# Patient Record
Sex: Male | Born: 1955 | Race: Black or African American | Hispanic: No | Marital: Married | Smoking: Current every day smoker
Health system: Southern US, Community
[De-identification: ages and names within clinical notes are randomized; demographics above are authoritative.]

## PROBLEM LIST (undated history)

## (undated) DIAGNOSIS — J449 Chronic obstructive pulmonary disease, unspecified: Secondary | ICD-10-CM

## (undated) DIAGNOSIS — M1990 Unspecified osteoarthritis, unspecified site: Secondary | ICD-10-CM

## (undated) DIAGNOSIS — J61 Pneumoconiosis due to asbestos and other mineral fibers: Secondary | ICD-10-CM

## (undated) DIAGNOSIS — J439 Emphysema, unspecified: Secondary | ICD-10-CM

## (undated) DIAGNOSIS — I10 Essential (primary) hypertension: Secondary | ICD-10-CM

## (undated) DIAGNOSIS — Z86010 Personal history of colonic polyps: Secondary | ICD-10-CM

## (undated) DIAGNOSIS — Z8619 Personal history of other infectious and parasitic diseases: Secondary | ICD-10-CM

## (undated) HISTORY — DX: Unspecified osteoarthritis, unspecified site: M19.90

## (undated) HISTORY — DX: Essential (primary) hypertension: I10

## (undated) HISTORY — PX: COLONOSCOPY: SHX174

## (undated) HISTORY — DX: Chronic obstructive pulmonary disease, unspecified: J44.9

## (undated) HISTORY — DX: Personal history of colonic polyps: Z86.010

## (undated) HISTORY — DX: Emphysema, unspecified: J43.9

## (undated) HISTORY — DX: Personal history of other infectious and parasitic diseases: Z86.19

---

## 1998-07-28 ENCOUNTER — Encounter: Payer: Self-pay | Admitting: Family Medicine

## 1998-07-28 ENCOUNTER — Ambulatory Visit (HOSPITAL_COMMUNITY): Admission: RE | Admit: 1998-07-28 | Discharge: 1998-07-28 | Payer: Self-pay | Admitting: *Deleted

## 2000-05-07 ENCOUNTER — Encounter: Payer: Self-pay | Admitting: Family Medicine

## 2000-05-07 ENCOUNTER — Encounter: Admission: RE | Admit: 2000-05-07 | Discharge: 2000-05-07 | Payer: Self-pay | Admitting: Family Medicine

## 2005-04-19 ENCOUNTER — Ambulatory Visit: Payer: Self-pay | Admitting: Internal Medicine

## 2008-08-05 ENCOUNTER — Ambulatory Visit: Payer: Self-pay | Admitting: Family Medicine

## 2008-08-05 DIAGNOSIS — I10 Essential (primary) hypertension: Secondary | ICD-10-CM

## 2008-08-06 ENCOUNTER — Telehealth: Payer: Self-pay | Admitting: Family Medicine

## 2008-08-06 ENCOUNTER — Encounter: Payer: Self-pay | Admitting: Family Medicine

## 2008-08-06 LAB — CONVERTED CEMR LAB
ALT: 25 units/L (ref 0–53)
AST: 21 units/L (ref 0–37)
Albumin: 4.6 g/dL (ref 3.5–5.2)
Alkaline Phosphatase: 78 units/L (ref 39–117)
BUN: 25 mg/dL — ABNORMAL HIGH (ref 6–23)
CO2: 21 meq/L (ref 19–32)
Calcium: 9.8 mg/dL (ref 8.4–10.5)
Chloride: 102 meq/L (ref 96–112)
Creatinine, Ser: 1.38 mg/dL (ref 0.40–1.50)
Glucose, Bld: 95 mg/dL (ref 70–99)
Potassium: 4.2 meq/L (ref 3.5–5.3)
Sodium: 136 meq/L (ref 135–145)
TSH: 2.325 microintl units/mL (ref 0.350–4.500)
Total Bilirubin: 0.5 mg/dL (ref 0.3–1.2)
Total Protein: 7.6 g/dL (ref 6.0–8.3)

## 2008-09-09 ENCOUNTER — Ambulatory Visit: Payer: Self-pay | Admitting: Family Medicine

## 2008-09-09 DIAGNOSIS — K219 Gastro-esophageal reflux disease without esophagitis: Secondary | ICD-10-CM

## 2008-12-29 ENCOUNTER — Encounter (INDEPENDENT_AMBULATORY_CARE_PROVIDER_SITE_OTHER): Payer: Self-pay | Admitting: *Deleted

## 2009-02-08 ENCOUNTER — Encounter: Admission: RE | Admit: 2009-02-08 | Discharge: 2009-02-08 | Payer: Self-pay | Admitting: Family Medicine

## 2009-02-08 ENCOUNTER — Ambulatory Visit: Payer: Self-pay | Admitting: Family Medicine

## 2009-02-08 DIAGNOSIS — F1010 Alcohol abuse, uncomplicated: Secondary | ICD-10-CM | POA: Insufficient documentation

## 2009-02-08 DIAGNOSIS — Z77090 Contact with and (suspected) exposure to asbestos: Secondary | ICD-10-CM

## 2009-02-08 DIAGNOSIS — F1020 Alcohol dependence, uncomplicated: Secondary | ICD-10-CM | POA: Insufficient documentation

## 2009-02-09 ENCOUNTER — Telehealth: Payer: Self-pay | Admitting: Family Medicine

## 2009-02-10 ENCOUNTER — Encounter: Payer: Self-pay | Admitting: Family Medicine

## 2009-02-10 LAB — CONVERTED CEMR LAB
Folate: 9.3 ng/mL
Vitamin B-12: 375 pg/mL (ref 211–911)

## 2009-06-03 ENCOUNTER — Encounter (INDEPENDENT_AMBULATORY_CARE_PROVIDER_SITE_OTHER): Payer: Self-pay | Admitting: *Deleted

## 2009-06-04 ENCOUNTER — Ambulatory Visit: Payer: Self-pay | Admitting: Internal Medicine

## 2009-06-18 ENCOUNTER — Ambulatory Visit: Payer: Self-pay | Admitting: Internal Medicine

## 2009-06-22 ENCOUNTER — Encounter: Payer: Self-pay | Admitting: Internal Medicine

## 2009-07-03 ENCOUNTER — Ambulatory Visit: Payer: Self-pay | Admitting: Family Medicine

## 2009-07-03 DIAGNOSIS — K1120 Sialoadenitis, unspecified: Secondary | ICD-10-CM

## 2009-07-04 ENCOUNTER — Encounter: Payer: Self-pay | Admitting: Family Medicine

## 2009-07-04 LAB — CONVERTED CEMR LAB
ALT: 26 units/L (ref 0–53)
AST: 22 units/L (ref 0–37)
Albumin: 4.3 g/dL (ref 3.5–5.2)
Alkaline Phosphatase: 72 units/L (ref 39–117)
Amylase: 53 units/L (ref 0–105)
BUN: 22 mg/dL (ref 6–23)
Basophils Absolute: 0 10*3/uL (ref 0.0–0.1)
Basophils Relative: 1 % (ref 0–1)
CO2: 21 meq/L (ref 19–32)
Calcium: 9.5 mg/dL (ref 8.4–10.5)
Chloride: 107 meq/L (ref 96–112)
Creatinine, Ser: 1.18 mg/dL (ref 0.40–1.50)
Eosinophils Absolute: 0.1 10*3/uL (ref 0.0–0.7)
Eosinophils Relative: 2 % (ref 0–5)
Glucose, Bld: 90 mg/dL (ref 70–99)
HCT: 44 % (ref 39.0–52.0)
Hemoglobin: 14.9 g/dL (ref 13.0–17.0)
Lymphocytes Relative: 33 % (ref 12–46)
Lymphs Abs: 2.1 10*3/uL (ref 0.7–4.0)
MCHC: 33.9 g/dL (ref 30.0–36.0)
MCV: 101.6 fL — ABNORMAL HIGH (ref 78.0–100.0)
Monocytes Absolute: 0.6 10*3/uL (ref 0.1–1.0)
Monocytes Relative: 9 % (ref 3–12)
Mumps IgG: 3.6 — ABNORMAL HIGH
Neutro Abs: 3.5 10*3/uL (ref 1.7–7.7)
Neutrophils Relative %: 55 % (ref 43–77)
Platelets: 192 10*3/uL (ref 150–400)
Potassium: 4.4 meq/L (ref 3.5–5.3)
RBC: 4.33 M/uL (ref 4.22–5.81)
RDW: 13 % (ref 11.5–15.5)
Sodium: 139 meq/L (ref 135–145)
Total Bilirubin: 0.5 mg/dL (ref 0.3–1.2)
Total Protein: 7.2 g/dL (ref 6.0–8.3)
WBC: 6.4 10*3/uL (ref 4.0–10.5)

## 2009-07-09 ENCOUNTER — Ambulatory Visit: Payer: Self-pay | Admitting: Family Medicine

## 2009-08-13 ENCOUNTER — Ambulatory Visit: Payer: Self-pay | Admitting: Family Medicine

## 2009-09-16 ENCOUNTER — Ambulatory Visit: Payer: Self-pay | Admitting: Family Medicine

## 2009-09-16 ENCOUNTER — Encounter: Admission: RE | Admit: 2009-09-16 | Discharge: 2009-09-16 | Payer: Self-pay | Admitting: Family Medicine

## 2009-09-16 DIAGNOSIS — M79609 Pain in unspecified limb: Secondary | ICD-10-CM

## 2009-10-26 ENCOUNTER — Ambulatory Visit: Payer: Self-pay | Admitting: Family

## 2009-10-26 DIAGNOSIS — F17200 Nicotine dependence, unspecified, uncomplicated: Secondary | ICD-10-CM

## 2009-11-29 ENCOUNTER — Encounter: Payer: Self-pay | Admitting: Family Medicine

## 2009-12-08 ENCOUNTER — Ambulatory Visit: Payer: Self-pay | Admitting: Family Medicine

## 2009-12-08 DIAGNOSIS — H0019 Chalazion unspecified eye, unspecified eyelid: Secondary | ICD-10-CM | POA: Insufficient documentation

## 2009-12-23 ENCOUNTER — Ambulatory Visit: Payer: Self-pay | Admitting: Family Medicine

## 2009-12-23 DIAGNOSIS — J449 Chronic obstructive pulmonary disease, unspecified: Secondary | ICD-10-CM

## 2009-12-23 DIAGNOSIS — J4489 Other specified chronic obstructive pulmonary disease: Secondary | ICD-10-CM | POA: Insufficient documentation

## 2009-12-27 ENCOUNTER — Encounter: Payer: Self-pay | Admitting: Family Medicine

## 2010-02-11 ENCOUNTER — Encounter: Payer: Self-pay | Admitting: Family Medicine

## 2010-03-01 ENCOUNTER — Ambulatory Visit: Payer: Self-pay | Admitting: Family Medicine

## 2010-06-19 LAB — CONVERTED CEMR LAB
ALT: 28 units/L (ref 0–53)
AST: 29 units/L (ref 0–37)
Albumin: 4.4 g/dL (ref 3.5–5.2)
Alkaline Phosphatase: 73 units/L (ref 39–117)
BUN: 20 mg/dL (ref 6–23)
CO2: 18 meq/L — ABNORMAL LOW (ref 19–32)
Calcium: 9.4 mg/dL (ref 8.4–10.5)
Chloride: 107 meq/L (ref 96–112)
Cholesterol: 167 mg/dL (ref 0–200)
Creatinine, Ser: 1.19 mg/dL (ref 0.40–1.50)
Glucose, Bld: 107 mg/dL — ABNORMAL HIGH (ref 70–99)
HCT: 43.9 % (ref 39.0–52.0)
HDL: 81 mg/dL (ref >39)
Hemoglobin: 15.1 g/dL (ref 13.0–17.0)
LDL Cholesterol: 59 mg/dL (ref 0–99)
MCHC: 34.4 g/dL (ref 30.0–36.0)
MCV: 102.1 fL — ABNORMAL HIGH (ref 78.0–100.0)
Platelets: 173 10*3/uL (ref 150–400)
Potassium: 4 meq/L (ref 3.5–5.3)
RBC: 4.3 M/uL (ref 4.22–5.81)
RDW: 13.1 % (ref 11.5–15.5)
Sodium: 139 meq/L (ref 135–145)
TSH: 2.321 microintl units/mL (ref 0.350–4.500)
Total Bilirubin: 0.8 mg/dL (ref 0.3–1.2)
Total CHOL/HDL Ratio: 2.1
Total Protein: 7.1 g/dL (ref 6.0–8.3)
Triglycerides: 137 mg/dL (ref <150)
VLDL: 27 mg/dL (ref 0–40)
WBC: 4 10*3/uL (ref 4.0–10.5)

## 2010-06-21 NOTE — Consult Note (Signed)
Summary: [facility]  [facility]   Imported By: Lanelle Bal 01/04/2010 12:01:42  _____________________________________________________________________  External Attachment:    Type:   Image     Comment:   External Document

## 2010-06-21 NOTE — Consult Note (Signed)
Summary: [facility]  [facility]   Imported By: Lanelle Bal 02/21/2010 14:26:11  _____________________________________________________________________  External Attachment:    Type:   Image     Comment:   External Document

## 2010-06-21 NOTE — Assessment & Plan Note (Signed)
Summary: cyst on eye   Vital Signs:  Patient profile:   55 year old male Height:      71.4 inches Weight:      222 pounds BMI:     30.73 O2 Sat:      98 % on Room air Pulse rate:   99 / minute BP sitting:   120 / 80  (left arm) Cuff size:   large  Vitals Entered By: Payton Spark CMA (December 08, 2009 10:08 AM)  O2 Flow:  Room air CC: ? skin tag on upper L eye lid.    Primary Care Provider:  Nani Gasser MD  CC:  ? skin tag on upper L eye lid. [name] Kitchen  History of Present Illness: 55 yo AAF presents for a cyst in the L upper eyelid x 3 days.  It is not painful and does not obstruct visual field.  Denies any blurry vision, watering, redness.    He also wants to get back on Chantix for smoking cessation as he quit it 1 wk ago after seeing a commercial on TV about risk factors for heart dz.  He did well on it x 3 wks w/o any adverse SEs and has no real risk factors other than smoking and well controlled HTN for AMI.  He was recently dx'd with COPD and hx of asbestos exposure and his pulmonologist really wants him to quit smoking.  It had been working well to help him quit smoking.  Allergies: No Known Drug Allergies  Past History:  Past Medical History: COPD  Review of Systems      See HPI  Physical Exam  Eyes:  conjunctiva clear. small white inner canthus/ L upper lid meibomian cyst w/o erythema or edema.     Impression & Recommendations:  Problem # 1:  MEIBOMIAN CYST (ICD-373.2) Small upper lid cyst, nontender and not obstructing visual field x 3 days only. Will use warm water compresses and nightly use of erythromycin ointment.  If not improved in 2 wks, call for optho referral to have this lanced.    Problem # 2:  TOBACCO ABUSE (ICD-305.1) Restart Chantix continuing month pack.  he was recently diagnosed with COPD and has hx of asbestos exposure so it is imperative that he quit smoking.  He had done really well on it for 3 wks w/o adverse SEs.  Discussed the black  box warning for SCD.  he has well controlled HTN but no other risk factors for AMI/ SCD.  Call if develops ANY chest pain.  He agrees to proceed with Chantix, understanding the risk factors.   The following medications were removed from the medication list:    Chantix Starting Month Pak 0.5 Mg X 11 & 1 Mg X 42 Tabs (Varenicline tartrate) .[name] Kitchen... Take as directed    Chantix Continuing Month Pak 1 Mg Tabs (Varenicline tartrate) .[name] Kitchen... Take as directed  Complete Medication List: 1)  Tribenzor 40-5-25 Mg Tabs (Olmesartan-amlodipine-hctz) .... Take 1 tablet by mouth once a day 2)  Erythromycin 0.5% Ointment  .... Apply 1 cm ribbon to lower eyelid at bedtime x 7 days  Patient Instructions: 1)  For conjunctival cyst: 2)  Use warm moist compresses 5 min 6 x a day. 3)  Use Erythromcyin ointment at bedtime x 1 wk. 4)  Call if no improvement/ getting bigger or more painful after 2 wks for a referral to the eye doctor. 5)  Restart Chantix for smoking cessation.  Call if any problems like chest pain  or shortness of breath occurs.  Stay on for a total of 3-4 mos.   Prescriptions: ERYTHROMYCIN 0.5% OINTMENT apply 1 cm ribbon to lower eyelid at bedtime x 7 days  #3.5 g x 0   Entered and Authorized by:   Seymour Bars DO   Signed by:   Seymour Bars DO on 12/08/2009   Method used:   Printed then faxed to ...       [facility] [address] (retail)       [address]       [place], [place]  [address]       Ph: [phone]       Fax: [phone]   RxID:   [id number]

## 2010-06-21 NOTE — Assessment & Plan Note (Signed)
Summary: SWOLLEN JAW x this am  rm 3   Vital Signs:  Patient Profile:   55 Years Old Male CC:      swollen jaw x this am Height:     71.4 inches Weight:      227 pounds O2 Sat:      98 % O2 treatment:    Room Air Temp:     97.0 degrees F oral Pulse rate:   85 / minute Pulse rhythm:   regular Resp:     16 per minute BP sitting:   156 / 97  (right arm) Cuff size:   regular  Vitals Entered By: Areta Haber CMA (July 03, 2009 1:55 PM)                  Prior Medication List:  DIOVAN HCT 320-25 MG TABS (VALSARTAN-HYDROCHLOROTHIAZIDE) Take 1 tablet by mouth once a day in AM MOVIPREP 100 GM  SOLR (PEG-KCL-NACL-NASULF-NA ASC-C) As per prep instructions.   Current Allergies: No known allergies History of Present Illness Chief Complaint: swollen jaw x this am History of Present Illness: Patient awoke this Am w/ swelling of the R jaw. Nopain fever or trauma to the area noted. Noprevious swelling of the jaw before.  Current Problems: PAROTITIS, RIGHT (ICD-527.2) ALCOHOL ABUSE (ICD-305.00) ASBESTOS EXPOSURE, HX OF (ICD-V15.84) GERD (ICD-530.81) HYPERTENSION, BENIGN (ICD-401.1)   Current Meds DIOVAN HCT 320-25 MG TABS (VALSARTAN-HYDROCHLOROTHIAZIDE) Take 1 tablet by mouth once a day in AM MOVIPREP 100 GM  SOLR (PEG-KCL-NACL-NASULF-NA ASC-C) As per prep instructions. CEFUROXIME AXETIL 500 MG  TABS (CEFUROXIME AXETIL) 1 by mouth 2 times daily  REVIEW OF SYSTEMS Constitutional Symptoms      Denies fever, chills, night sweats, weight loss, weight gain, and fatigue.  Eyes       Denies change in vision, eye pain, eye discharge, glasses, contact lenses, and eye surgery. Ear/Nose/Throat/Mouth       Denies hearing loss/aids, change in hearing, ear pain, ear discharge, dizziness, frequent runny nose, frequent nose bleeds, sinus problems, sore throat, hoarseness, and tooth pain or bleeding.      Comments: r jaw swelling Respiratory       Denies dry cough, productive cough,  wheezing, shortness of breath, asthma, bronchitis, and emphysema/COPD.  Cardiovascular       Denies murmurs, chest pain, and tires easily with exhertion.    Gastrointestinal       Denies stomach pain, nausea/vomiting, diarrhea, constipation, blood in bowel movements, and indigestion. Genitourniary       Denies painful urination, kidney stones, and loss of urinary control. Neurological       Denies paralysis, seizures, and fainting/blackouts. Musculoskeletal       Denies muscle pain, joint pain, joint stiffness, decreased range of motion, redness, swelling, muscle weakness, and gout.  Skin       Denies bruising, unusual mles/lumps or sores, and hair/skin or nail changes.  Psych       Denies mood changes, temper/anger issues, anxiety/stress, speech problems, depression, and sleep problems. Other Comments: Swollen jaw x this am. Pt states that he is not in any discomfort.   Past History:  Past Medical History: Last updated: 08/05/2008 None  Past Surgical History: Last updated: 08/05/2008 None  Family History: Last updated: 08/05/2008 None  Social History: Last updated: 08/05/2008 Current Smoker Alcohol use-yes Regular exercise-no  Risk Factors: Alcohol Use: 1 (08/05/2008) Exercise: no (08/05/2008)  Risk Factors: Smoking Status: current (08/05/2008) Packs/Day: 1.0 (08/05/2008)  Family History: Reviewed history from 08/05/2008 and  no changes required. None  Social History: Reviewed history from 08/05/2008 and no changes required. Current Smoker Alcohol use-yes Regular exercise-no Physical Exam General appearance: well developed, well nourished, no acute distress Oral/Pharynx: swelling of the R parotid gland tender to palpation Neck: neck supple,  trachea midline, no masses Skin: no obvious rashes or lesions MSE: oriented to time, place, and person Assessment New Problems: PAROTITIS, RIGHT (ICD-527.2)  parotitid titis  Patient Education: Patient and/or  caregiver instructed in the following: rest fluids and Tylenol, quit smoking. may apply warmcompress  Plan New Medications/Changes: CEFUROXIME AXETIL 500 MG  TABS (CEFUROXIME AXETIL) 1 by mouth 2 times daily  #20 x 0, 07/03/2009, Hassan Rowan MD  New Orders: New Patient Level III [99203] T-CBC w/Diff [04540-98119] T-Amylase [14782-95621] T-Comprehensive Metabolic Panel [80053-22900] T- * Misc. Laboratory test [phone] Planning Comments:   will evaluate mumps status as well  Follow Up: Follow up on an as needed basis, Follow up with Primary Physician  The patient and/or caregiver has been counseled thoroughly with regard to medications prescribed including dosage, schedule, interactions, rationale for use, and possible side effects and they verbalize understanding.  Diagnoses and expected course of recovery discussed and will return if not improved as expected or if the condition worsens. Patient and/or caregiver verbalized understanding.  Prescriptions: CEFUROXIME AXETIL 500 MG  TABS (CEFUROXIME AXETIL) 1 by mouth 2 times daily  #20 x 0   Entered and Authorized by:   Hassan Rowan MD   Signed by:   Hassan Rowan MD on 07/03/2009   Method used:   Printed then faxed to ...       [facility]* (retail)       [address] [place], [place]  [address]       Ph: [phone]       Fax: [phone]   RxID:   [id number]   Patient Instructions: 1)  If swelling gets worse or pain develops then please proceed to Ed  2)  Please schedule a follow-up appointment as needed. 3)  Please schedule an appointment with your primary doctor in :2-7 days as needed 4)  Tobacco is very bad for your health and your loved ones! You Should stop smoking!. 5)  Stop Smoking Tips: Choose a Quit date. Cut down before the Quit date. decide what you will do as a substitute when you feel the urge to smoke(gum,toothpick,exercise). 6)  Recommended remaining out of work until 07/06/2009.

## 2010-06-21 NOTE — Assessment & Plan Note (Signed)
Summary: BREATHING PROBLEMS   Vital Signs:  Patient profile:   55 year old male Height:      71.4 inches Weight:      225 pounds O2 Sat:      95 % on Room air BP sitting:   113 / 75  (left arm) Cuff size:   regular  Vitals Entered By: Avon Gully CMA, Duncan Dull) (December 23, 2009 9:46 AM)  O2 Flow:  Room air  Serial Vital Signs/Assessments:                                PEF    PreRx  PostRx Time      O2 Sat  O2 Type     L/min  L/min  L/min   By 10:12 AM                      400    350    400     Kim Johnson 10:29 AM                      310    450    350     Kim Johnson  Comments: 10:12 AM Pt in yellow zone By: Kathlene November  10:29 AM Pt in yellow zone post nebulizer treatment By: Kathlene November   CC: wants something to help breath better, 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    Recently dx wtih COPD in [place], [place].  Not given any medications for treatment. Likely secondary to his asbestosis. I dont have any notes from the pulmonologist.  Also feels he is more SOB and chest is tight this AM. Has noticed some wheezing at night. He doesn't have any inhalers, etc. No fever or cold sxs. NO chest pain.    Current Medications (verified): 1)  Tribenzor 40-5-25 Mg Tabs (Olmesartan-Amlodipine-Hctz) .... Take 1 Tablet By Mouth Once A Day 2)  Erythromycin 0.5% Ointment .... Apply 1 Cm Ribbon To Lower Eyelid At Bedtime X 7 Days  Allergies (verified): No Known Drug Allergies  Comments:  Nurse/Medical Assistant: The patient's medications and allergies were reviewed with the patient and were updated in the Medication and Allergy  Lists. Avon Gully CMA, Duncan Dull) (December 23, 2009 9:47 AM)  Physical Exam  General:  Well-developed,well-nourished,in no acute distress; alert,appropriate and cooperative throughout examination Head:  Normocephalic and atraumatic without obvious abnormalities. No apparent alopecia or balding. Eyes:  No corneal or conjunctival inflammation noted. EOMI. Perrla.  Ears:  External ear exam shows no significant lesions or deformities.  Otoscopic examination reveals clear canals, tympanic membranes are intact bilaterally without bulging, retraction, inflammation or discharge. Hearing is grossly normal bilaterally. Nose:  External nasal examination shows no deformity or inflammation.  Mouth:  Oral mucosa and oropharynx without lesions or exudates.  Teeth in good repair. Neck:  No deformities, masses, or tenderness noted. Lungs:  Rhonchi and wheezing at the bases bilaterally.  Post neb exam was clear. No increased respiratoyr effort.  Heart:  Normal rate and regular rhythm. S1 and S2  normal without gallop, murmur, click, rub or other extra sounds.   Impression & Recommendations:  Problem # 1:  COPD (ICD-496) Given NEB for acute exacerbation today. Post NEB exam was completely clear.   Given Albuerol HFA as needed. I really need to get his report from Pulm to grade his COPD and determine the best medication regimen.  for now will treat with albuterol. F/Uin one month and see how often using the albuterol and hopefully by then will have his records.    His updated medication list for this problem includes:    Proair Hfa 108 (90 Base) Mcg/act Aers (Albuterol sulfate) .[name] Kitchen... 2-4 puffs inhaled every 4-6 hours as needed for shortness of breath.  Orders: Albuterol Sulfate Sol 1mg  unit dose (S0630) Atrovent 1mg  (Neb) (Z6010) Nebulizer Tx (93235) Peak Flow Rate (94150)  Problem # 2:  MEIBOMIAN CYST (ICD-373.2)  Refer to ophtho for I & D.    Orders: Ophthalmology Referral  (Ophthalmology)  Complete Medication List: 1)  Tribenzor 40-5-25 Mg Tabs (Olmesartan-amlodipine-hctz) .... Take 1 tablet by mouth once a day 2)  Erythromycin 0.5% Ointment  .... Apply 1 cm ribbon to lower eyelid at bedtime x 7 days 3)  Proair Hfa 108 (90 Base) Mcg/act Aers (Albuterol sulfate) .... 2-4 puffs inhaled every 4-6 hours as needed for shortness of breath.  Patient Instructions: 1)  Please schedule a follow-up appointment in 1 month for COPD.  Prescriptions: PROAIR HFA 108 (90 BASE) MCG/ACT AERS (ALBUTEROL SULFATE) 2-4 puffs inhaled every 4-6 hours as needed for shortness of breath.  #1 x 1   Entered and Authorized by:   Nani Gasser MD   Signed by:   Nani Gasser MD on 12/23/2009   Method used:   Electronically to        [id number] (retail[facility]       [address]       [place], [place]  [address]       Ph: [phone]       Fax: [phone]   RxID:   [id number]    Medication Administration  Medication # 1:    Medication: Albuterol Sulfate Sol 1mg  unit dose    Diagnosis: COPD (ICD-496)    Dose: 2.5mg     Route: intranasal    Exp Date: 04/22/2011    Lot #: [id number]    Mfr: Nephron    Patient tolerated medication without complications    Given by: Kathlene November (December 23, 2009 10:12 AM)  Medication # 2:    Medication: Atrovent 1mg  (Neb)    Diagnosis: COPD (ICD-496)    Dose: 0.5mg     Route: inhaled    Exp Date: 04/22/2011    Lot #: [id number]    Mfr: Nephron    Patient tolerated medication without complications    Given by: Kathlene November (December 23, 2009 10:13 AM)  Orders Added: 1)  Albuterol Sulfate Sol 1mg  unit dose [J7613] 2)  Atrovent 1mg  (Neb) [W2993] 3)  Nebulizer Tx [71696] 4)  Ophthalmology Referral [Ophthalmology] 5)  Est. Patient Level III [78938] 6)  Peak Flow Rate [94150]

## 2010-06-21 NOTE — Assessment & Plan Note (Signed)
Summary: Left foot pain   Vital Signs:  Patient profile:   55 year old male Height:      71.4 inches Weight:      225 pounds Pulse rate:   84 / minute BP sitting:   131 / 80  (left arm) Cuff size:   large  Vitals Entered By: Kathlene November (September 16, 2009 10:25 AM) CC: left foot pain. hit left foot on steps 3 weeks ago still painfula dn swollen   Primary Care Provider:  Nani Gasser MD  CC:  left foot pain. hit left foot on steps 3 weeks ago still painfula dn swollen.  History of Present Illness: left foot pain. hit left foot on steps 3 weeks ago still painfula dn swollen. Using Aleve occ for pain. Immedidate swelling.  Still swelling. Still painful to walk on it at work as has to wear steel- toed boots.   Current Medications (verified): 1)  Tribenzor 40-5-25 Mg Tabs (Olmesartan-Amlodipine-Hctz) .... Take 1 Tablet By Mouth Once A Day  Allergies (verified): No Known Drug Allergies  Comments:  Nurse/Medical Assistant: The patient's medications and allergies were reviewed with the patient and were updated in the Medication and Allergy Lists. Kathlene November (September 16, 2009 10:26 AM)  Physical Exam  General:  Well-developed,well-nourished,in no acute distress; alert,appropriate and cooperative throughout examination Msk:  Left foot with trace edema over teh top of the foot. NROM of the ankle and great toe. Trace edema over teh great toe. Tender over the distal MT, 3rd.  No laxity in the ankle joint.  Pulses:  DP and post tib are 2+ bilat.  Skin:  no rashes.   Psych:  Cognition and judgment appear intact. Alert and cooperative with normal attention span and concentration. No apparent delusions, illusions, hallucinations   Impression & Recommendations:  Problem # 1:  FOOT PAIN, LEFT (ICD-729.5) Because of point tenderness will get xray to rule out fracture. Can use Tyelnol or aleve as needed for pain for now.  Will call wiht resutls later today. He is able to bear weight so no  crutches given at this time.  Orders: T-DG Foot Complete*L* (95188)  Complete Medication List: 1)  Tribenzor 40-5-25 Mg Tabs (Olmesartan-amlodipine-hctz) .... Take 1 tablet by mouth once a day

## 2010-06-21 NOTE — Letter (Signed)
Summary: Patient Notice- Polyp Results  [facility]  [address] [place], [place] [address]   Phone: [phone]  Fax: [phone]        June 22, 2009 MRN: [id number]    [facility] [address] [place], [place]  [address]    Dear Mr. 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 This means that they were pre-cancerous or that  they had the potential to change into cancer over time.   I recommend that you have a repeat colonoscopy in 3 years to determine if you have developed any new polyps over time. If you develop any new rectal bleeding, abdominal pain or significant bowel habit changes, please contact us before then.  In addition to repeating colonoscopy, changing health habits may reduce your risk of having more colon polyps and possibly, colon cancer. You may lower your risk of future polyps and colon cancer by adopting healthy habits such as not smoking or using tobacco (if you do), being physically active, losing weight (if overweight), and eating a diet which includes fruits and vegetables and limits red meat.  Please call us if you are having persistent problems or have questions about your condition that have not been fully answered at this time.  Sincerely,  Iva Boop MD, [facility]  This letter has been electronically signed by your physician.  Appended Document: Patient Notice- Polyp Results letter mailed 2.4.11

## 2010-06-21 NOTE — Miscellaneous (Signed)
Summary: [facility]  Clinical Lists Changes  Medications: Added new medication of MOVIPREP 100 GM  SOLR (PEG-KCL-NACL-NASULF-NA ASC-C) As per prep instructions. - Signed Rx of MOVIPREP 100 GM  SOLR (PEG-KCL-NACL-NASULF-NA ASC-C) As per prep instructions.;  #1 x 0;  Signed;  Entered by: Ezra Sites RN;  Authorized by: Iva Boop MD, [facility];  Method used: Electronically to [facility]*, [address], [place], [place]  [address], Ph: [phone], Fax: [phone] Observations: Added new observation of NKA: T (06/04/2009 10:51)    Prescriptions: MOVIPREP 100 GM  SOLR (PEG-KCL-NACL-NASULF-NA ASC-C) As per prep instructions.  #1 x 0   Entered by:   Ezra Sites RN   Authorized by:   Iva Boop MD, [facility]   Signed by:   Ezra Sites RN on 06/04/2009   Method used:   Electronically to        [facility]* (retail)       [address] [place], [place]  [address]       Ph: [phone]       Fax: [phone]   RxID:   [id number]

## 2010-06-21 NOTE — Assessment & Plan Note (Signed)
Summary: F/u on BP/med, & listen to lungs- jr   Vital Signs:  Patient profile:   55 year old male Height:      71.4 inches Weight:      220 pounds Pulse rate:   95 / minute BP sitting:   112 / 70  (left arm) Cuff size:   large  Vitals Entered By: Kathlene November (October 26, 2009 2:37 PM) CC: followup BP   Primary Care Provider:  Nani Gasser MD  CC:  followup BP.  History of Present Illness: [name] is a 55 year old male who presents today for follow up of his blood pressure.   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  He has an appointment with pulmonologist on 7/11.   Smokes 2 packs a day.  Has been smoking for 38 years.  Started in the [employer].  Wants to discuss smoking cessation.    Current Medications (verified): 1)  Tribenzor 40-5-25 Mg Tabs (Olmesartan-Amlodipine-Hctz) .... Take 1 Tablet By Mouth Once A Day  Allergies (verified): No Known Drug Allergies  Comments:  Nurse/Medical Assistant: The patient's medications and allergies were reviewed with the patient and were updated in the Medication and Allergy Lists. Kathlene November (October 26, 2009 2:37 PM)  Past History:  Past Medical History: Last updated: 08/05/2008 None  Past Surgical History: Last updated: 08/05/2008 None  Family History: Last updated: 08/05/2008 None  Social History: Last updated: 08/05/2008 Current Smoker Alcohol use-yes Regular exercise-no  Risk Factors: Alcohol Use: 1 (08/05/2008) Exercise: no (08/05/2008)  Risk Factors: Smoking Status: current (08/05/2008) Packs/Day: 1.0 (08/05/2008)  Physical Exam  General:  Well-developed,well-nourished,in no acute distress; alert,appropriate and cooperative throughout examination Head:  Normocephalic and atraumatic without obvious abnormalities. No apparent alopecia or balding. Lungs:  Normal respiratory effort, chest expands symmetrically.  Lungs are clear to auscultation, no crackles or wheezes. Heart:  Normal rate and regular rhythm. S1 and S2 normal without gallop, murmur, click, rub or other extra sounds. Psych:  Cognition and judgment appear intact. Alert and cooperative with normal attention span and concentration. No apparent delusions, illusions, hallucinations   Impression & Recommendations:  Problem # 1:  TOBACCO ABUSE (ICD-305.1) Patient wishes to start chantix.  Discussed side effects as well as risk of worsening depression.  Patient instructed to D/C med and call us immediately if he develops depressive symtoms. 15 minutes spent with patient, greater than 50% of this time was spent counseling patient on smoking cessation.   His updated medication list for this problem includes:    Chantix Starting Month Pak 0.5 Mg X 11 & 1 Mg X 42 Tabs (Varenicline tartrate) .Edward Warren... Take as directed    Chantix Continuing Month Pak 1 Mg Tabs (Varenicline tartrate) .Edward Warren... Take as directed  Problem # 2:  HYPERTENSION, BENIGN (ICD-401.1) Assessment: Improved BP is stable, continue tribenzor. His updated medication list for this problem includes:    Tribenzor 40-5-25 Mg Tabs (Olmesartan-amlodipine-hctz) .Edward Warren... Take 1 tablet by mouth once a day  BP today: 112/70 Prior BP: 131/80 (09/16/2009)  Prior 10 Yr Risk Heart Disease: 7 % (08/13/2009)  Labs Reviewed: K+: 4.4 (07/04/2009) Creat: : 1.18 (07/04/2009)   Chol: 167 (02/08/2009)   HDL: 81 (02/08/2009)   LDL: 59 (02/08/2009)   TG: 137 (02/08/2009)  Complete Medication List: 1)  Tribenzor 40-5-25 Mg Tabs (Olmesartan-amlodipine-hctz) .... Take 1 tablet by mouth once a day 2)  Chantix Starting Month  Pak 0.5 Mg X 11 & 1 Mg X 42 Tabs (Varenicline tartrate) .... Take as directed 3)  Chantix Continuing Month Pak 1 Mg Tabs (Varenicline tartrate) .... Take as directed  Patient Instructions: 1)  [facility] is offering Smoking Cessation classes starting 7/19/11and it runs for 7 weeks. Meets  every Tuesday from 12 -1:30pm. Classes are free. You can call [facility] to register [phone]. 2)  Pt can also do one on one counselling session (one time / 1hr) which is also free. contact name Edward Warren [phone]. 3)  Chantix -start 0.5mg  by mouth daily x 3 days, then 0.5 mg twice daily x 4 days, then 1mg  twice daily for 11 weeks.  Stop smoking after 7 days.   4)  Follow up in 1 month, call immediately if you develop depression while taking this medication of if you develop any thoughts of suicide. 5)  Please follow up in 1 month. Prescriptions: CHANTIX CONTINUING MONTH PAK 1 MG TABS (VARENICLINE TARTRATE) take as directed  #1 x 1   Entered and Authorized by:   Lemont Fillers FNP   Signed by:   Lemont Fillers FNP on 10/26/2009   Method used:   Print then Give to Patient   RxID:   [id number] CHANTIX STARTING MONTH PAK 0.5 MG X 11 & 1 MG X 42 TABS (VARENICLINE TARTRATE) take as directed  #1 x 0   Entered and Authorized by:   Lemont Fillers FNP   Signed by:   Lemont Fillers FNP on 10/26/2009   Method used:   Electronically to        [id number]       [address]       [place], [place]  [address]       Ph: [phone]       Fax: [phone]   RxID:   [id number]

## 2010-06-21 NOTE — Assessment & Plan Note (Signed)
Summary: F/U HTN   Vital Signs:  Patient profile:   55 year old male Height:      71.4 inches Weight:      221.75 pounds BMI:     30.69 Pulse rate:   103 / minute Pulse rhythm:   regular Resp:     20 per minute BP sitting:   131 / 86  (left arm) Cuff size:   large  Vitals Entered By: Mervin Kung CMA (August 13, 2009 8:52 AM) CC: room 5  follow up of blood pressure. Pt. has no new concerns and feels well., Hypertension Management   Primary Care Provider:  Nani Gasser MD  CC:  room 5  follow up of blood pressure. Pt. has no new concerns and feels well. and Hypertension Management.  History of Present Illness: room 5  follow up of blood pressure. Pt. has no new concerns and feels well.  Hypertension History:      He denies headache, chest pain, palpitations, dyspnea with exertion, orthopnea, PND, peripheral edema, visual symptoms, neurologic problems, syncope, and side effects from treatment.  He notes no problems with any antihypertensive medication side effects.  Has lost aobut 6 lbs. .        Positive major cardiovascular risk factors include male age 69 years old or older, hypertension, and current tobacco user.     Allergies (verified): No Known Drug Allergies  Physical Exam  General:  Well-developed,well-nourished,in no acute distress; alert,appropriate and cooperative throughout examination Head:  Normocephalic and atraumatic without obvious abnormalities. No apparent alopecia or balding. Lungs:  Normal respiratory effort, chest expands symmetrically. Lungs are clear to auscultation, no crackles or wheezes. Heart:  Normal rate and regular rhythm. S1 and S2 normal without gallop, murmur, click, rub or other extra sounds. Skin:  no rashes.   Psych:  Cognition and judgment appear intact. Alert and cooperative with normal attention span and concentration. No apparent delusions, illusions, hallucinations   Impression & Recommendations:  Problem # 1:   HYPERTENSION, BENIGN (ICD-401.1) Doing well overall. BP looks great. F/U in 4 months.   His updated medication list for this problem includes:    Tribenzor 40-5-25 Mg Tabs (Olmesartan-amlodipine-hctz) .[name] Kitchen... Take 1 tablet by mouth once a day  BP today: 131/86 Prior BP: 148/93 (07/09/2009)  Prior 10 Yr Risk Heart Disease: Not enough information (08/05/2008)  Labs Reviewed: K+: 4.4 (07/04/2009) Creat: : 1.18 (07/04/2009)   Chol: 167 (02/08/2009)   HDL: 81 (02/08/2009)   LDL: 59 (02/08/2009)   TG: 137 (02/08/2009)  Complete Medication List: 1)  Tribenzor 40-5-25 Mg Tabs (Olmesartan-amlodipine-hctz) .... Take 1 tablet by mouth once a day  Hypertension Assessment/Plan:      The patient's hypertensive risk group is category B: At least one risk factor (excluding diabetes) with no target organ damage.  His calculated 10 year risk of coronary heart disease is 7 %.  Today's blood pressure is 131/86.  His blood pressure goal is < 140/90. Prescriptions: TRIBENZOR 40-5-25 MG TABS (OLMESARTAN-AMLODIPINE-HCTZ) Take 1 tablet by mouth once a day  #30 x 6   Entered and Authorized by:   Nani Gasser MD   Signed by:   Nani Gasser MD on 08/13/2009   Method used:   Electronically to        [facility]* (retail)       [address] [place], [place]  [address]       Ph: [phone]  Fax: [phone]   RxID:   [id number]   Current Allergies (reviewed today): No known allergies

## 2010-06-21 NOTE — Assessment & Plan Note (Signed)
Summary: SWOLLEN GLAND [employer] JAW, HTN   Vital Signs:  Patient profile:   55 year old male Height:      71.4 inches Weight:      228 pounds BMI:     31.56 O2 Sat:      100 % on Room air Temp:     98.0 degrees F oral Pulse rate:   103 / minute BP sitting:   148 / 93  Vitals Entered BySelena Batten Johnson/April (July 09, 2009 1:17 PM)  O2 Flow:  Room air CC: f/u on urgent care visit   Primary Care Provider:  Nani Gasser MD  CC:  f/u on urgent care visit.  History of Present Illness: Woke up with jaw swelling on 2/12. Went to UC and placed on cefuroxime. No pain or trauma. Still on the ABX and feeling.  No fever.  Never had happen before. No ear pain.    Used to cough up large amoutns of phelgm about 6 ounces daily in the AM.  WE treated with ABX and he did get beterr.  Will occ get righr ear ear whistling.    Current Medications (verified): 1)  Diovan Hct 320-25 Mg Tabs (Valsartan-Hydrochlorothiazide) .... Take 1 Tablet By Mouth Once A Day in Am 2)  Cefuroxime Axetil 500 Mg  Tabs (Cefuroxime Axetil) .[name] Kitchen.. 1 By Mouth 2 Times Daily  Allergies (verified): No Known Drug Allergies  Physical Exam  General:  Well-developed,well-nourished,in no acute distress; alert,appropriate and cooperative throughout examination Head:  Right side of jaw is still midly swollen. No lesion. Right side of neck still swollen.  Eyes:  No corneal or conjunctival inflammation noted. EOMI. Perrla. Ears:  External ear exam shows no significant lesions or deformities.  Otoscopic examination reveals clear canals, tympanic membranes are intact bilaterally without bulging, retraction, inflammation or discharge. Hearing is grossly normal bilaterally. Nose:  no external deformity.   Mouth:  Oral mucosa and oropharynx without lesions or exudates.  Teeth in good repair. Neck:  No deformities, masses, or tenderness noted.   Impression & Recommendations:  Problem # 1:  PAROTITIS, RIGHT (ICD-527.2) Improving.   MAke sure to continue ABX.  Has a few days left. If not 100% resolved by time complete ABX then call me and will send in 4 more days of ABX.   Problem # 2:  HYPERTENSION, BENIGN (ICD-401.1) Will change to tribenzor and add the amlodipine.  Samples given for 2 weeks. If works well will send in rx and has coupon card for $25.  Fu in next month to recheck BP to make sure at goal.  His updated medication list for this problem includes:    Tribenzor 40-5-25 Mg Tabs (Olmesartan-amlodipine-hctz) .[name] Kitchen... Take 1 tablet by mouth once a day  Complete Medication List: 1)  Tribenzor 40-5-25 Mg Tabs (Olmesartan-amlodipine-hctz) .... Take 1 tablet by mouth once a day 2)  Cefuroxime Axetil 500 Mg Tabs (Cefuroxime axetil) .[name] Kitchen.. 1 by mouth 2 times daily

## 2010-06-21 NOTE — Procedures (Signed)
Summary: Colonoscopy  Patient: Edward Warren Note: All result statuses are Final unless otherwise noted.  Tests: (1) Colonoscopy (COL)   COL Colonoscopy           DONE     [facility]     [address]     [place], [place]  [address]           COLONOSCOPY PROCEDURE REPORT           PATIENT:  Edward, Warren  MR#:  [id number]     BIRTHDATE:  23-Dec-1955, 53 yrs. old  GENDER:  male           ENDOSCOPIST:  Iva Boop, MD, [facility]           PROCEDURE DATE:  06/18/2009     PROCEDURE:  Colonoscopy with biopsy and snare polypectomy     ASA CLASS:  Class II     INDICATIONS:  history of polyps 5 mmpolyp removed (not recovered)     in 2005           MEDICATIONS:   Fentanyl 50 mcg IV, Versed 8 mg IV           DESCRIPTION OF PROCEDURE:   After the risks benefits and     alternatives of the procedure were thoroughly explained, informed     consent was obtained.  Digital rectal exam was performed and     revealed no abnormalities.   The LB [id number] endoscope     was introduced through the anus and advanced to the cecum, which     was identified by both the appendix and ileocecal valve, without     limitations.  The quality of the prep was excellent, using     MoviPrep.  The instrument was then slowly withdrawn as the colon     was fully examined.     Insertion: 1:50 minutes Withdrawal: 19:30 minutes     <<PROCEDUREIMAGES>>           FINDINGS:  Five polyps were found. Three transverse polyps and two     descending polyps. Maximum size 8 mm, minimum 3 mm. The polyps     were removed using cold biopsy forceps or were snared without     cautery. Retrieval was successful.  Severe diverticulosis was     found in the sigmoid colon.  This was otherwise a normal     examination of the colon.   Retroflexed views in the rectum     revealed no abnormalities.    The scope was then withdrawn from     the patient and the procedure completed.           COMPLICATIONS:  None          ENDOSCOPIC IMPRESSION:     1) Five polyps removed, maximum size 8 mm, minimum 3 mm.     2) Severe diverticulosis in the sigmoid colon     3) Otherwise normal examination, excellent prep     4) Prior polyp removed 2005 (5 mm and not recovered)           REPEAT EXAM:  In for Colonoscopy, pending biopsy results.           Iva Boop, MD, [facility]           CC:  Nani Gasser, MD     The Patient           n.     eSIGNED:  Iva Boop at 06/18/2009 09:53 AM           Caren Macadam, [id number]  Note: An exclamation mark (!) indicates a result that was not dispersed into the flowsheet. Document Creation Date: 06/18/2009 9:53 AM _______________________________________________________________________  (1) Order result status: Final Collection or observation date-time: 06/18/2009 09:45 Requested date-time:  Receipt date-time:  Reported date-time:  Referring Physician:   Ordering Physician: Stan Head [phone]) Specimen Source:  Source: Launa Grill Order Number: [id number] Lab site:   Appended Document: Colonoscopy     Procedures Next Due Date:    Colonoscopy: 06/2012

## 2010-06-21 NOTE — Letter (Signed)
Summary: [facility] Instructions  [facility]  [address] [place], [place] [address]   Phone: [phone]  Fax: [phone]       TRAVANTE KNEE    08-17-1955    MRN: [id number]        Procedure Day /Date:  Friday 06/18/2009     Arrival Time:  8:00 am      Procedure Time: 9:00 am     Location of Procedure:                    _ x_  [facility] (4th Floor)   PREPARATION FOR COLONOSCOPY WITH MOVIPREP   Starting 5 days prior to your procedure Sunday 1/23 do not eat nuts, seeds, popcorn, corn, beans, peas,  salads, or any raw vegetables.  Do not take any fiber supplements (e.g. Metamucil, Citrucel, and Benefiber).  THE DAY BEFORE YOUR PROCEDURE         DATE: Thursday 1/27  1.  Drink clear liquids the entire day-NO SOLID FOOD  2.  Do not drink anything colored red or purple.  Avoid juices with pulp.  No orange juice.  3.  Drink at least 64 oz. (8 glasses) of fluid/clear liquids during the day to prevent dehydration and help the prep work efficiently.  CLEAR LIQUIDS INCLUDE: Water Jello Ice Popsicles Tea (sugar ok, no milk/cream) Powdered fruit flavored drinks Coffee (sugar ok, no milk/cream) Gatorade Juice: apple, white grape, white cranberry  Lemonade Clear bullion, consomm, broth Carbonated beverages (any kind) Strained chicken noodle soup Hard Candy                             4.  In the morning, mix first dose of MoviPrep solution:    Empty 1 Pouch A and 1 Pouch B into the disposable container    Add lukewarm drinking water to the top line of the container. Mix to dissolve    Refrigerate (mixed solution should be used within 24 hrs)  5.  Begin drinking the prep at 5:00 p.m. The MoviPrep container is divided by 4 marks.   Every 15 minutes drink the solution down to the next mark (approximately 8 oz) until the full liter is complete.   6.  Follow completed prep with 16 oz of clear liquid of your choice (Nothing red or purple).   Continue to drink clear liquids until bedtime.  7.  Before going to bed, mix second dose of MoviPrep solution:    Empty 1 Pouch A and 1 Pouch B into the disposable container    Add lukewarm drinking water to the top line of the container. Mix to dissolve    Refrigerate  THE DAY OF YOUR PROCEDURE      DATE: Friday 1/28  Beginning at 4:00 a.m. (5 hours before procedure):         1. Every 15 minutes, drink the solution down to the next mark (approx 8 oz) until the full liter is complete.  2. Follow completed prep with 16 oz. of clear liquid of your choice.    3. You may drink clear liquids until 7:00 am (2 HOURS BEFORE PROCEDURE).   MEDICATION INSTRUCTIONS  Unless otherwise instructed, you should take regular prescription medications with a small sip of water   as early as possible the morning of your procedure.   Additional medication instructions: Hold Diovan/HCT morning of procedure.  OTHER INSTRUCTIONS  You will need a responsible adult at least 57 years of age to accompany you and drive you home.   This person must remain in the waiting room during your procedure.  Wear loose fitting clothing that is easily removed.  Leave jewelry and other valuables at home.  However, you may wish to bring a book to read or  an iPod/MP3 player to listen to music as you wait for your procedure to start.  Remove all body piercing jewelry and leave at home.  Total time from sign-in until discharge is approximately 2-3 hours.  You should go home directly after your procedure and rest.  You can resume normal activities the  day after your procedure.  The day of your procedure you should not:   Drive   Make legal decisions   Operate machinery   Drink alcohol   Return to work  You will receive specific instructions about eating, activities and medications before you leave.    The above instructions have been reviewed and explained to me by   Ezra Sites RN  June 04, 2009 11:11 AM     I fully understand and can verbalize these instructions _____________________________ Date _________

## 2010-06-21 NOTE — Assessment & Plan Note (Signed)
Summary: COPD   Vital Signs:  Patient profile:   55 year old male Height:      71.4 inches Weight:      227 pounds Pulse rate:   93 / minute BP sitting:   124 / 73  (right arm) Cuff size:   regular  Vitals Entered By: Avon Gully CMA, Duncan Dull) (March 01, 2010 8:55 AM) CC: needs a refill on his inhaler,brought record in re his copd and wanted to discuss tx options   Primary Care Izaiha Lo:  Nani Gasser MD  CC:  needs a refill on his inhaler and brought record in re his copd and wanted to discuss tx options.  History of Present Illness: needs a refill on his inhaler,brought record in re his copd and wanted to discuss tx options.   Current Medications (verified): 1)  Tribenzor 40-5-25 Mg Tabs (Olmesartan-Amlodipine-Hctz) .... Take 1 Tablet By Mouth Once A Day 2)  Proair Hfa 108 (90 Base) Mcg/act Aers (Albuterol Sulfate) .... 2-4 Puffs Inhaled Every 4-6 Hours As Needed For Shortness of Breath.  Allergies (verified): No Known Drug Allergies  Comments:  Nurse/Medical Assistant: The patient's medications and allergies were reviewed with the patient and were updated in the Medication and Allergy Lists. Avon Gully CMA, Duncan Dull) (March 01, 2010 8:57 AM)  Past History:  Past Medical History: COPD-PFTS: 11/2008: FEV1ratio of 62, FEV1 63% and FVC 80% (mod, class II)  Social History: Current Smoker, 2ppd Alcohol use-yes Regular exercise-no  Physical Exam  General:  Well-developed,well-nourished,in no acute distress; alert,appropriate and cooperative throughout examination Head:  Normocephalic and atraumatic without obvious abnormalities. No apparent alopecia or balding. Lungs:  Normal respiratory effort, chest expands symmetrically. Diffuse coarse BS with expiration.  Heart:  Normal rate and regular rhythm. S1 and S2 normal without gallop, murmur, click, rub or other extra sounds. Skin:  no rashes.     Impression & Recommendations:  Problem # 1:  COPD  (ICD-496) REviewed his PFTs with him that he brought in from his lung evaluation. It showed FEV1ratio of 62, FEV1 63% and FVC 80%.  He does have moderate COPD. Discused that tx should be with a as needed SABA and a LABA. Will start spiriva. Given samples and demonstrated how to use the device.  He did his first dose here in the office.  Rviewed potential SE. F/U in 2 months to see how he is doing on it.  He will have the PFTs repeated in July 2012. Also had discussed about smoking cessation being even more important than medication. He is on the Chantix and is down to 4 cig a day, form 2ppd. He is still struggling with the lat 4 cig. Gave encouragment.  His updated medication list for this problem includes:    Proair Hfa 108 (90 Base) Mcg/act Aers (Albuterol sulfate) .[name] Kitchen... 2-4 puffs inhaled every 4-6 hours as needed for shortness of breath.    Spiriva Handihaler 18 Mcg Caps (Tiotropium bromide monohydrate) .[name] Kitchen... 1 capsule inhaled daily.  Complete Medication List: 1)  Tribenzor 40-5-25 Mg Tabs (Olmesartan-amlodipine-hctz) .... Take 1 tablet by mouth once a day 2)  Proair Hfa 108 (90 Base) Mcg/act Aers (Albuterol sulfate) .... 2-4 puffs inhaled every 4-6 hours as needed for shortness of breath. 3)  Spiriva Handihaler 18 Mcg Caps (Tiotropium bromide monohydrate) .[name] Kitchen.. 1 capsule inhaled daily.  Contraindications/Deferment of Procedures/Staging:    Test/Procedure: FLU VAX    Reason for deferment: patient declined   Patient Instructions: 1)  Start the spiriva once a day.  2)  Please schedule a follow-up appointment in 2 months for breathing.  Prescriptions: PROAIR HFA 108 (90 BASE) MCG/ACT AERS (ALBUTEROL SULFATE) 2-4 puffs inhaled every 4-6 hours as needed for shortness of breath.  #1 x 1   Entered and Authorized by:   Nani Gasser MD   Signed by:   Nani Gasser MD on 03/01/2010   Method used:   Electronically to        [id number]       [address]        [place], [place]  [address]       Ph: [phone]       Fax: [phone]   RxID:   [id number] SPIRIVA HANDIHALER 18 MCG CAPS (TIOTROPIUM BROMIDE MONOHYDRATE) 1 capsule inhaled daily.  #30 x 6   Entered and Authorized by:   Nani Gasser MD   Signed by:   Nani Gasser MD on 03/01/2010   Method used:   [id number] [address] [facility]       [address]       [place], [place]  [address]       Ph: [phone]       Fax: [phone]   RxID:   [id number]

## 2010-07-01 ENCOUNTER — Ambulatory Visit (INDEPENDENT_AMBULATORY_CARE_PROVIDER_SITE_OTHER): Payer: Self-pay | Admitting: Family Medicine

## 2010-07-01 ENCOUNTER — Encounter: Payer: Self-pay | Admitting: Family Medicine

## 2010-07-01 DIAGNOSIS — E291 Testicular hypofunction: Secondary | ICD-10-CM | POA: Insufficient documentation

## 2010-07-01 DIAGNOSIS — R6882 Decreased libido: Secondary | ICD-10-CM

## 2010-07-01 DIAGNOSIS — F1010 Alcohol abuse, uncomplicated: Secondary | ICD-10-CM

## 2010-07-01 DIAGNOSIS — I10 Essential (primary) hypertension: Secondary | ICD-10-CM

## 2010-07-01 DIAGNOSIS — J449 Chronic obstructive pulmonary disease, unspecified: Secondary | ICD-10-CM

## 2010-07-05 LAB — CONVERTED CEMR LAB
ALT: 48 units/L (ref 0–53)
AST: 33 units/L (ref 0–37)
Albumin: 4.6 g/dL (ref 3.5–5.2)
Alkaline Phosphatase: 77 units/L (ref 39–117)
BUN: 20 mg/dL (ref 6–23)
Calcium: 9.9 mg/dL (ref 8.4–10.5)
Chloride: 103 meq/L (ref 96–112)
Folate: 8.9 ng/mL
HDL: 72 mg/dL (ref >39)
LDL Cholesterol: 90 mg/dL (ref 0–99)
MCHC: 34.4 g/dL (ref 30.0–36.0)
Magnesium: 2.1 mg/dL (ref 1.5–2.5)
Platelets: 194 10*3/uL (ref 150–400)
Potassium: 4.4 meq/L (ref 3.5–5.3)
RDW: 12.3 % (ref 11.5–15.5)
Sex Hormone Binding: 38 nmol/L (ref 13–71)
Sodium: 136 meq/L (ref 135–145)
TSH: 1 microintl units/mL (ref 0.350–4.500)
Testosterone Free: 45.4 pg/mL — ABNORMAL LOW (ref 47.0–244.0)
Testosterone-% Free: 1.8 % (ref 1.6–2.9)
Testosterone: 255.38 ng/dL (ref 250–890)
Total Protein: 7.6 g/dL (ref 6.0–8.3)

## 2010-07-07 NOTE — Assessment & Plan Note (Signed)
Summary: COPD, HTN, etc   Vital Signs:  Patient profile:   55 year old male Height:      71.4 inches Weight:      228 pounds Pulse rate:   89 / minute BP sitting:   139 / 94  (right arm) Cuff size:   regular  Vitals Entered By: Avon Gully CMA, Duncan Dull) (July 01, 2010 9:32 AM) CC: f/u breathing   Primary Care Provider:  Nani Gasser MD  CC:  f/u breathing.  History of Present Illness: BAck up to 10 cig per day.  Says the Chantix didn't help. Did cause vivid dreams. Using the spirvia nad doing well with it. Feels well. No SOB recently. Hasn't had to use his albuterol. No difficlty with the spiriva device.   Dec libido over teh last year. Denies any erectil dysfunction. Wants to know if he can try viagra.   Current Medications (verified): 1)  Tribenzor 40-5-25 Mg Tabs (Olmesartan-Amlodipine-Hctz) .... Take 1 Tablet By Mouth Once A Day 2)  Proair Hfa 108 (90 Base) Mcg/act Aers (Albuterol Sulfate) .... 2-4 Puffs Inhaled Every 4-6 Hours As Needed For Shortness of Breath. 3)  Spiriva Handihaler 18 Mcg Caps (Tiotropium Bromide Monohydrate) .[name] Kitchen.. 1 Capsule Inhaled Daily.  Allergies (verified): No Known Drug Allergies  Comments:  Nurse/Medical Assistant: The patient's medications and allergies were reviewed with the patient and were updated in the Medication and Allergy Lists. Avon Gully CMA, Duncan Dull) (July 01, 2010 9:33 AM)  Physical Exam  General:  Well-developed,well-nourished,in no acute distress; alert,appropriate and cooperative throughout examination Neck:  No deformities, masses, or tenderness noted. No TM.  Lungs:  Diffuse coarse BS bilat. NO wheezine.  Heart:  Normal rate and regular rhythm. S1 and S2 normal without gallop, murmur, click, rub or other extra sounds. Extremities:  NO Le edema.  Cervical Nodes:  No lymphadenopathy noted   Impression & Recommendations:  Problem # 1:  COPD (ICD-496) He is dong well on the spiriva. Hasn't picked up  his refill yet and says he does miss some days. He is still struggling with his smoking, though he is down to 10 cig per day. RFeminded him that quitting smoking will do more for his lungs than any medeication.Repeat spirometry in June 2012  His updated medication list for this problem includes:    Proair Hfa 108 (90 Base) Mcg/act Aers (Albuterol sulfate) .[name] Kitchen... 2-4 puffs inhaled every 4-6 hours as needed for shortness of breath.    Spiriva Handihaler 18 Mcg Caps (Tiotropium bromide monohydrate) .[name] Kitchen... 1 capsule inhaled daily.  Orders: T-CBC No Diff ([id number])  Problem # 2:  LIBIDO, DECREASED (ICD-799.81) Check testosterone levels. Answered yest to 4 quesitons on the testosterone quiz.  Orders: T-Testosterone, Free and Total [phone]) T-TSH [phone])  Problem # 3:  HYPERTENSION, BENIGN (ICD-401.1) Up today but normall well controlled. F/U in 2-3 weeks to recheck. Says he draink alot of alcohol last night and that is probably why BP is elevated. Due for labs as well. Has been one year.  His updated medication list for this problem includes:    Tribenzor 40-5-25 Mg Tabs (Olmesartan-amlodipine-hctz) .[name] Kitchen... Take 1 tablet by mouth once a day  Orders: T-Comprehensive Metabolic Panel [phone]) T-Lipid Profile ([id number]) T-CBC No Diff ([id number]) T-TSH ([id number])  Problem # 4:  ALCOHOL ABUSE (ICD-305.00) Wil rule out deficiencies from alcohol intake.  Orders: T-Vitamin B12 [id number]) T-Folate [phone]) T-Magnesium [phone])  Complete Medication List: 1)  Tribenzor 40-5-25 Mg Tabs (Olmesartan-amlodipine-hctz) .... Take 1 tablet by mouth once a  day 2)  Proair Hfa 108 (90 Base) Mcg/act Aers (Albuterol sulfate) .... 2-4 puffs inhaled every 4-6 hours as needed for shortness of breath. 3)  Spiriva Handihaler 18 Mcg Caps (Tiotropium bromide monohydrate) .[name] Kitchen.. 1 capsule inhaled daily.  Patient Instructions: 1)  We will call you with your lab resutls on Mon or Tues.     Orders Added: 1)  T-Comprehensive Metabolic Panel [[id number]] 2)  T-Lipid Profile [[id number]] 3)  T-CBC No Diff [[id number]] 4)  T-Vitamin B12 [[id number]] 5)  T-Folate [23340] 6)  T-Magnesium [[id number]] 7)  T-Testosterone, Free and Total [[id number]] 8)  T-TSH [[id number]] 9)  Est. Patient Level IV [29562]

## 2010-07-08 ENCOUNTER — Encounter: Payer: Self-pay | Admitting: Family Medicine

## 2010-07-12 LAB — CONVERTED CEMR LAB: Testosterone-% Free: 1.8 % (ref 1.6–2.9)

## 2010-07-19 NOTE — Letter (Signed)
Summary: Low Testosterone Quiz  Low Testosterone Quiz   Imported By: Maryln Gottron 07/11/2010 13:45:28  _____________________________________________________________________  External Attachment:    Type:   Image     Comment:   External Document

## 2010-07-20 ENCOUNTER — Encounter (INDEPENDENT_AMBULATORY_CARE_PROVIDER_SITE_OTHER): Payer: Self-pay | Admitting: *Deleted

## 2010-07-28 NOTE — Letter (Signed)
Summary: Generic Letter  [facility] [place]  [address]   [place], [place] [address]   Phone: [phone]  Fax: [phone]    07/20/2010  Edward Warren [address] [place], [place]  [address]  [place]  Dear Edward Warren,    We have been unable to reach you by phone. Please call the office with your updated contact info.Enclosed is a copy of your labs. Please call for an explanation of lab results       Sincerely,   Nani Gasser, MD

## 2010-09-15 ENCOUNTER — Ambulatory Visit: Payer: 59 | Admitting: Family Medicine

## 2010-09-18 ENCOUNTER — Other Ambulatory Visit: Payer: Self-pay | Admitting: Family Medicine

## 2010-09-20 ENCOUNTER — Encounter: Payer: Self-pay | Admitting: Family Medicine

## 2010-09-20 ENCOUNTER — Ambulatory Visit (INDEPENDENT_AMBULATORY_CARE_PROVIDER_SITE_OTHER): Payer: 59 | Admitting: Family Medicine

## 2010-09-20 VITALS — BP 120/82 | HR 109 | Temp 98.4°F | Ht 71.0 in | Wt 218.0 lb

## 2010-09-20 DIAGNOSIS — B029 Zoster without complications: Secondary | ICD-10-CM

## 2010-09-20 MED ORDER — GABAPENTIN 300 MG PO CAPS: ORAL_CAPSULE | ORAL | Status: DC

## 2010-09-20 NOTE — Assessment & Plan Note (Signed)
Diagnosed in the ED 8 days ago.  Finishing out 10 days of Acyclovir.  He is done with prednisone and did not ask for a RF of Vicodin which helps some.  He is still having sharp pains over the L upper head which has kept him out of work.  Will add Neurontin for PHN 300 mg 3 x  Day and info given to pt on PHN -- will follow to see how long this lasts.  He will call us in 7 days to let us know if it's helping.  May need to go up on his dose.

## 2010-09-20 NOTE — Progress Notes (Signed)
  Subjective:    Patient ID: Edward Warren, male    DOB: May 05, 1956, 55 y.o.   MRN: [id number]  HPI 55 yo AAF presents for L sided HA pain after being diagnosed with shingles last Monday in the ED.  He was started on Acyclovir and is finishing out his 10 days, Prednisone and Vicodin which is helping some.  Has been out of work due to sharp pain L upper head.  Denies vision problems but has some L ear pain.  Denies dizziness.    BP 120/82  Pulse 109  Temp(Src) 98.4 F (36.9 C) (Oral)  Ht 5\' 11"  (1.803 m)  Wt 218 lb (98.884 kg)  BMI 30.40 kg/m2  SpO2 100%   Review of Systems as per HPI    Objective:   Physical Exam  Constitutional: He appears well-developed and well-nourished. No distress.  HENT:  Head: Normocephalic and atraumatic.  Nose: Nose normal.  Eyes: Conjunctivae are normal. Left eye exhibits no discharge. No scleral icterus.  Neck: Neck supple.  Cardiovascular: Normal rate, regular rhythm and normal heart sounds.   Pulmonary/Chest: Effort normal. No respiratory distress. He has wheezes.  Musculoskeletal: He exhibits no edema.  Lymphadenopathy:    He has no cervical adenopathy.  Skin: Skin is warm and dry.       Healing vesicular, erythematous rash over L upper forehead/ parietal region          Assessment & Plan:

## 2010-09-20 NOTE — Patient Instructions (Signed)
Start Gabapentin (neurontin) - start with 1 capsule 2 x a day for 3 days then increase to 3 x a day.  Call and let us know how you are doing in 1 wk.  We may need to raise your dose over the phone. Postherpetic Neuralgia Shingles is a painful disease. It is caused by the herpes zoster virus. This is the same virus which also causes chicken pox. It can affect the torso, limbs, or the face. For most people, shingles is a condition of rather sudden onset. Pain usually lasts about one month. In older patients, or patients with poor immune systems, a painful, longstanding (chronic) condition called postherpetic neuralgia can develop. This condition rarely happens before age 64. But at least half of people over fifty become affected following an attack of shingles. There is a natural tendency for this condition to improve over time with no treatment. Less than five percent of patients have pain that lasts for more than 1 year. DIAGNOSIS Herpes is usually easily diagnosed on physical exam. Pain sometimes follows when the skin sores (lesions) have disappeared. It is called postherpetic neuralgia. That name simply means the pain that follows herpes. TREATMENT   Treating this condition may be difficult. Usually one of the tricyclic anti-depressants, often amitriptyline, is the first line of treatment. There is evidence that the sooner these medications are given, the more likely they are to reduce pain.   Conventional analgesics, regional nerve blocks, and anticonvulsants have little benefit in most cases when used alone. Other tricyclic anti-depressants are used as a second option if the first antidepressant is unsuccessful.   Anticonvulsants, including carbamazepine, have been found to provide some added benefit when used with a tricyclic anti-depressant. This is especially for the stabbing type of pain similar to that of trigeminal neuralgia.   Chronic opioid therapy. This is a strong narcotic pain  medication. It is used to treat pain that is resistant to other measures. The issues of dependency and tolerance can be reduced with closely managed care.   Some cream treatments are applied locally to the affected area. They can help when used with other treatments. Their use may be difficult in the case of postherpetic trigeminal neuralgia. This is involved with the face. So the substances can irritate the eye and the skin around the eye. Examples of creams used include Capsaicin and lidocaine creams.   For shingles, antiviral therapies along with analgesics are recommended. Studies of the effect of anti-viral agents such as acyclovir on shingles have been done. They show improved rates of healing and decreased severity of sudden (acute) pain. Some observations suggest that nerve blocks during shingles infection will:   Reduce pain.   Shorten the acute episode.   Prevent the emergence of postherpetic neuralgia.  Viral medications used include Acyclovir (Zovirax), Valacyclovir, Famciclovir and a lysine diet. Document Released: 07/29/2002 Document Re-Released: 08/04/2008 [facility] Patient Information 2011 [place], [place].

## 2010-09-23 ENCOUNTER — Telehealth: Payer: Self-pay | Admitting: *Deleted

## 2010-09-23 NOTE — Telephone Encounter (Signed)
Pt would like refill on hydrocodone. He states that you asked him to call if he needed refill.

## 2010-09-26 MED ORDER — HYDROCODONE-ACETAMINOPHEN 5-500 MG PO TABS: ORAL_TABLET | ORAL | Status: DC

## 2010-09-26 NOTE — Telephone Encounter (Signed)
Rx called in and Pt aware

## 2010-09-26 NOTE — Telephone Encounter (Signed)
I put in the RX for his Vicodin.  Pls call this in since I am out of the office today.

## 2010-10-18 ENCOUNTER — Telehealth: Payer: Self-pay | Admitting: Family Medicine

## 2010-10-18 NOTE — Telephone Encounter (Signed)
OK to schedule

## 2010-10-18 NOTE — Telephone Encounter (Signed)
Pt notified to schedule appt for breathing difficulty.  Scheduled for 10-20-10 @ 08:45 am and okayed by Dr. Linford Arnold. Jarvis Newcomer, LPN Domingo Dimes

## 2010-10-18 NOTE — Telephone Encounter (Signed)
Pts wife called and wants to  Know recommendations.  Called 911 for husband Sunday night because he was having trouble getting his breathe.  Hx of emphyzema, asbestosis.  No heart conditions.  911 gave O2, breathing treatment, checked BP, and heart which was fine.  Pt did not go to hospital. Plan:  Would you like pt to follow up this week here int he office??? Routed to Dr. Marlyne Beards, LPN Domingo Dimes

## 2010-10-20 ENCOUNTER — Ambulatory Visit: Payer: 59 | Admitting: Family Medicine

## 2010-10-21 ENCOUNTER — Encounter: Payer: Self-pay | Admitting: Family Medicine

## 2010-10-21 ENCOUNTER — Ambulatory Visit (INDEPENDENT_AMBULATORY_CARE_PROVIDER_SITE_OTHER): Payer: 59 | Admitting: Family Medicine

## 2010-10-21 DIAGNOSIS — J449 Chronic obstructive pulmonary disease, unspecified: Secondary | ICD-10-CM

## 2010-10-21 DIAGNOSIS — F17200 Nicotine dependence, unspecified, uncomplicated: Secondary | ICD-10-CM

## 2010-10-21 MED ORDER — ALBUTEROL SULFATE HFA 108 (90 BASE) MCG/ACT IN AERS: 2.0000 | INHALATION_SPRAY | Freq: Four times a day (QID) | RESPIRATORY_TRACT | Status: DC | PRN

## 2010-10-21 MED ORDER — BUPROPION HCL ER (SR) 150 MG PO TB12: 150.0000 mg | ORAL_TABLET | Freq: Two times a day (BID) | ORAL | Status: DC

## 2010-10-21 NOTE — Assessment & Plan Note (Signed)
We discussed strategies to help him quit including nicotine replacement. Discussed how the gum works. Also discussed wellbutrin as an option. Discussed potential SE. Pt would like the wellbutin.  Will start this med.  Also recommend a quit program like 1-800-QUIT NOW as well to help him.

## 2010-10-21 NOTE — Assessment & Plan Note (Signed)
Discussed Need to start using his albuterol 2-3 x a day for the next week then decrease slowly. If wheeze not resolving, starts with fever or gets SOB again then call the office.  Pt understands and agrees to care plan. I agree with him that his biggest issues is that he continues to smoke.

## 2010-10-21 NOTE — Progress Notes (Signed)
  Subjective:    Patient ID: Edward Warren, male    DOB: [birth year], 55 y.o.   MRN: [id number]  HPI About 7 days ago was going to bed and felt so SOB that his wife called EMS.  He had worked in the yard that day. He is feeling some better.  No fever or URI sxs. He is noticing some wheeze. He has been using his proair occasionally at night.  He is still smoking but says he wants to quit. The chantix caused too many dreams so stopped it. He wants to discuss other options.  No cough. No SOB today.    Review of Systems     Objective:   Physical Exam  Constitutional: He is oriented to person, place, and time. He appears well-developed and well-nourished.  HENT:  Head: Normocephalic and atraumatic.  Right Ear: External ear normal.  Left Ear: External ear normal.  Eyes: Conjunctivae and EOM are normal.  Neck: Neck supple. No thyromegaly present.  Cardiovascular: Normal rate, regular rhythm and normal heart sounds.   Pulmonary/Chest: Effort normal. No respiratory distress. He has wheezes. He has no rales.       Expiratory wheeze at the base of the right lung.   Lymphadenopathy:    He has no cervical adenopathy.  Neurological: He is alert and oriented to person, place, and time.  Skin: Skin is warm and dry.  Psychiatric: He has a normal mood and affect. His behavior is normal.          Assessment & Plan:

## 2011-01-13 ENCOUNTER — Other Ambulatory Visit: Payer: Self-pay | Admitting: Family Medicine

## 2011-04-19 ENCOUNTER — Ambulatory Visit (INDEPENDENT_AMBULATORY_CARE_PROVIDER_SITE_OTHER): Payer: 59 | Admitting: Family Medicine

## 2011-04-19 VITALS — BP 145/98 | HR 85 | Wt 222.0 lb

## 2011-04-19 DIAGNOSIS — Z23 Encounter for immunization: Secondary | ICD-10-CM

## 2011-04-19 DIAGNOSIS — I10 Essential (primary) hypertension: Secondary | ICD-10-CM

## 2011-04-19 LAB — COMPLETE METABOLIC PANEL WITH GFR
AST: 25 U/L (ref 0–37)
Albumin: 4.5 g/dL (ref 3.5–5.2)
Alkaline Phosphatase: 88 U/L (ref 39–117)
BUN: 20 mg/dL (ref 6–23)
Creat: 1.16 mg/dL (ref 0.50–1.35)
GFR, Est Non African American: 70 mL/min
Glucose, Bld: 105 mg/dL — ABNORMAL HIGH (ref 70–99)
Potassium: 3.8 mEq/L (ref 3.5–5.3)
Total Bilirubin: 0.7 mg/dL (ref 0.3–1.2)

## 2011-04-19 NOTE — Progress Notes (Signed)
  Subjective:    Patient ID: Edward Warren, male    DOB: [birth year], 55 y.o.   MRN: [id number]  Hypertension This is a chronic problem. The current episode started more than 1 year ago. The problem is unchanged. Pertinent negatives include no blurred vision, palpitations or shortness of breath. There are no associated agents to hypertension. Past treatments include angiotensin blockers, calcium channel blockers and diuretics. The current treatment provides mild improvement. There are no compliance problems.       Review of Systems  Eyes: Negative for blurred vision.  Respiratory: Negative for shortness of breath.   Cardiovascular: Negative for palpitations.       Objective:   Physical Exam  Constitutional: He is oriented to person, place, and time. He appears well-developed and well-nourished.  HENT:  Head: Normocephalic and atraumatic.  Cardiovascular: Normal rate, regular rhythm and normal heart sounds.   Pulmonary/Chest: Effort normal and breath sounds normal.  Neurological: He is alert and oriented to person, place, and time.  Skin: Skin is warm and dry.  Psychiatric: He has a normal mood and affect. His behavior is normal.          Assessment & Plan:  HTN - BP is not well controlled. Today. He says he missed a couple days but did take it this morning. He is a little hesitant to adjust his medication. Thus asked him to return in about one month for a nurse blood pressure check when he is taking medication consistently. It is not down we will adjust his Tribenzor. Otherwise if repeat blood pressure with the nurse in one month as normal and he can followup with me in 6 months. Due for CMP.  Today given today.

## 2011-04-19 NOTE — Patient Instructions (Signed)
See me in 6 months See nurse in one month for BP check.

## 2011-04-24 ENCOUNTER — Emergency Department: Admission: EM | Admit: 2011-04-24 | Discharge: 2011-04-24 | Discharge status: Absent without leave | Payer: Self-pay

## 2011-05-05 ENCOUNTER — Other Ambulatory Visit: Payer: Self-pay | Admitting: Family Medicine

## 2011-07-11 ENCOUNTER — Telehealth: Payer: Self-pay | Admitting: Family Medicine

## 2011-07-11 DIAGNOSIS — I5189 Other ill-defined heart diseases: Secondary | ICD-10-CM

## 2011-07-11 DIAGNOSIS — I050 Rheumatic mitral stenosis: Secondary | ICD-10-CM | POA: Insufficient documentation

## 2011-07-11 NOTE — Telephone Encounter (Signed)
Call pt: needs appt to f/u on BP.

## 2011-07-13 NOTE — Telephone Encounter (Signed)
Left message on vm to call and schedule appt for BP with doc

## 2011-08-13 ENCOUNTER — Other Ambulatory Visit: Payer: Self-pay | Admitting: Family Medicine

## 2011-11-13 ENCOUNTER — Other Ambulatory Visit: Payer: Self-pay | Admitting: Family Medicine

## 2011-11-27 ENCOUNTER — Encounter: Payer: 59 | Admitting: Family Medicine

## 2011-11-27 ENCOUNTER — Other Ambulatory Visit: Payer: Self-pay | Admitting: Family Medicine

## 2011-12-08 ENCOUNTER — Encounter: Payer: 59 | Admitting: Family Medicine

## 2012-01-29 ENCOUNTER — Telehealth: Payer: Self-pay | Admitting: Family Medicine

## 2012-01-29 NOTE — Telephone Encounter (Signed)
Not sure what type of paperwork needed. I haven't seen anything come across. Does he need copy of our records.

## 2012-01-29 NOTE — Telephone Encounter (Signed)
He would like a copy of our records

## 2012-01-29 NOTE — Telephone Encounter (Signed)
Wife wanted to come by today to pick up [facility] paper work

## 2012-01-30 NOTE — Telephone Encounter (Signed)
Copy made for patient to pick up today

## 2012-03-09 ENCOUNTER — Other Ambulatory Visit: Payer: Self-pay | Admitting: Family Medicine

## 2012-03-11 NOTE — Telephone Encounter (Signed)
Needs appointment

## 2012-05-08 ENCOUNTER — Ambulatory Visit (INDEPENDENT_AMBULATORY_CARE_PROVIDER_SITE_OTHER): Payer: 59 | Admitting: Family Medicine

## 2012-05-08 ENCOUNTER — Encounter: Payer: Self-pay | Admitting: Family Medicine

## 2012-05-08 VITALS — BP 114/74 | HR 90 | Ht 71.0 in | Wt 228.0 lb

## 2012-05-08 DIAGNOSIS — Z0000 Encounter for general adult medical examination without abnormal findings: Secondary | ICD-10-CM

## 2012-05-08 DIAGNOSIS — D239 Other benign neoplasm of skin, unspecified: Secondary | ICD-10-CM

## 2012-05-08 LAB — LIPID PANEL
HDL: 62 mg/dL (ref >39)
Total CHOL/HDL Ratio: 2.4 Ratio
Triglycerides: 134 mg/dL (ref <150)

## 2012-05-08 LAB — COMPLETE METABOLIC PANEL WITH GFR
Alkaline Phosphatase: 76 U/L (ref 39–117)
CO2: 22 mEq/L (ref 19–32)
Creat: 1.11 mg/dL (ref 0.50–1.35)
GFR, Est African American: 84 mL/min
GFR, Est Non African American: 73 mL/min
Glucose, Bld: 102 mg/dL — ABNORMAL HIGH (ref 70–99)
Total Bilirubin: 0.6 mg/dL (ref 0.3–1.2)

## 2012-05-08 MED ORDER — TIOTROPIUM BROMIDE MONOHYDRATE 18 MCG IN CAPS: 18.0000 ug | ORAL_CAPSULE | Freq: Every day | RESPIRATORY_TRACT | Status: DC

## 2012-05-08 NOTE — Progress Notes (Signed)
  Subjective:    Patient ID: Edward Warren, male    DOB: 09-19-1955, 56 y.o.   MRN: [id number]  HPI    Review of Systems     Objective:   Physical Exam        Assessment & Plan:   Subjective:     Edward Warren is a 56 y.o. male and is here for a comprehensive physical exam. The patient reports no problems.  History   Social History  . Marital Status: Married    Spouse Name: N/A    Number of Children: 1  . Years of Education: N/A   Occupational History  .      [employer]   Social History Main Topics  . Smoking status: Current Every Day Smoker -- 0.5 packs/day    Types: Cigarettes  . Smokeless tobacco: Not on file  . Alcohol Use: 1.0 oz/week    2 drink(s) per week  . Drug Use: No  . Sexually Active: Not on file   Other Topics Concern  . Not on file   Social History Narrative   No regular exercise.    Health Maintenance  Topic Date Due  . Colonoscopy  06/19/2019  . Tetanus/tdap  04/18/2021  . Influenza Vaccine  01/21/2012    The following portions of the patient's history were reviewed and updated as appropriate: allergies, current medications, past family history, past medical history, past social history, past surgical history and problem list.  Review of Systems A comprehensive review of systems was negative.   Objective:    BP 114/74  Pulse 90  Ht 5\' 11"  (1.803 m)  Wt 228 lb (103.42 kg)  BMI 31.80 kg/m2 General appearance: alert, cooperative and appears stated age Head: Normocephalic, without obvious abnormality, atraumatic Eyes: conj clear, EOMi, PEERLA Ears: normal TM's and external ear canals both ears Nose: Nares normal. Septum midline. Mucosa normal. No drainage or sinus tenderness. Throat: lips, mucosa, and tongue normal; teeth and gums normal Neck: no adenopathy, no carotid bruit, no JVD, supple, symmetrical, trachea midline and thyroid not enlarged, symmetric, no tenderness/mass/nodules Back: symmetric, no curvature. ROM  normal. No CVA tenderness. Lungs: clear to auscultation bilaterally, expiratory wheeze today.  Heart: regular rate and rhythm, S1, S2 normal, no murmur, click, rub or gallop Abdomen: soft, non-tender; bowel sounds normal; no masses,  no organomegaly Extremities: extremities normal, atraumatic, no cyanosis or edema Pulses: 2+ and symmetric Skin: Skin color, texture, turgor normal. No rashes or lesions, dermatofibroma on left lower abdomen.  Lymph nodes: Cervical, supraclavicular, and axillary nodes normal. Neurologic: Alert and oriented X 3, normal strength and tone. Normal symmetric reflexes. Normal coordination and gait    Assessment:    Healthy male exam.      Plan:     See After Visit Summary for Counseling Recommendations  Keep up a regular exercise program and make sure you are eating a healthy diet Try to eat 4 servings of dairy a day, or if you are lactose intolerant take a calcium with vitamin D daily.  Your vaccines are up to date.   Dermatofibroma getting larger on her abdomen- Gave reassurance and will refer to Derm if getting larger.

## 2012-05-08 NOTE — Patient Instructions (Addendum)
Keep up a regular exercise program and make sure you are eating a healthy diet Try to eat 4 servings of dairy a day, or if you are lactose intolerant take a calcium with vitamin D daily.  Your vaccines are up to date.   

## 2012-05-09 NOTE — Progress Notes (Signed)
Quick Note:  All labs are normal. ______ 

## 2012-06-19 ENCOUNTER — Other Ambulatory Visit: Payer: Self-pay | Admitting: Family Medicine

## 2012-06-28 ENCOUNTER — Other Ambulatory Visit: Payer: Self-pay | Admitting: Family Medicine

## 2012-07-05 ENCOUNTER — Encounter: Payer: Self-pay | Admitting: Internal Medicine

## 2012-07-05 DIAGNOSIS — Z860100 Personal history of colon polyps, unspecified: Secondary | ICD-10-CM

## 2012-07-05 HISTORY — DX: Personal history of colonic polyps: Z86.010

## 2012-07-05 HISTORY — DX: Personal history of colon polyps, unspecified: Z86.0100

## 2012-07-08 ENCOUNTER — Encounter: Payer: Self-pay | Admitting: Internal Medicine

## 2012-07-30 ENCOUNTER — Encounter: Payer: Self-pay | Admitting: Internal Medicine

## 2012-08-29 ENCOUNTER — Encounter: Payer: 59 | Admitting: Internal Medicine

## 2012-09-19 ENCOUNTER — Encounter: Payer: Self-pay | Admitting: Internal Medicine

## 2012-10-22 ENCOUNTER — Other Ambulatory Visit: Payer: 59 | Admitting: Family Medicine

## 2012-10-23 ENCOUNTER — Ambulatory Visit (INDEPENDENT_AMBULATORY_CARE_PROVIDER_SITE_OTHER): Payer: 59 | Admitting: Family Medicine

## 2012-10-23 ENCOUNTER — Encounter: Payer: Self-pay | Admitting: Family Medicine

## 2012-10-23 VITALS — BP 126/88 | HR 86 | Ht 71.0 in | Wt 226.0 lb

## 2012-10-23 DIAGNOSIS — J4489 Other specified chronic obstructive pulmonary disease: Secondary | ICD-10-CM

## 2012-10-23 DIAGNOSIS — F17200 Nicotine dependence, unspecified, uncomplicated: Secondary | ICD-10-CM

## 2012-10-23 DIAGNOSIS — J449 Chronic obstructive pulmonary disease, unspecified: Secondary | ICD-10-CM | POA: Insufficient documentation

## 2012-10-23 LAB — PULMONARY FUNCTION TEST

## 2012-10-23 MED ORDER — BECLOMETHASONE DIPROPIONATE 40 MCG/ACT IN AERS: 1.0000 | INHALATION_SPRAY | Freq: Two times a day (BID) | RESPIRATORY_TRACT | Status: DC

## 2012-10-23 MED ORDER — ALBUTEROL SULFATE (2.5 MG/3ML) 0.083% IN NEBU
2.5000 mg | INHALATION_SOLUTION | Freq: Once | RESPIRATORY_TRACT | Status: AC
  Administered 2012-10-23: 2.5 mg via RESPIRATORY_TRACT

## 2012-10-23 NOTE — Progress Notes (Signed)
  Subjective:    Patient ID: Edward Warren, male    DOB: [birth year], 57 y.o.   MRN: [id number]  HPI He is a smoker - Smokes 1/2 ppd for 3 months.  Has smoked 41 yr. Used to be 1.5 ppd.  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  Occ at night feels SOB and uses a rescue inhaler. Using spiriva. He also had a CT scan of his chest on last month. He has some scar tissue from environmental exposure. He was happy to know that he had no new lesions in his chest. He does this yearly. They usually do some type of breathing test there but we have never done one here in the office to better help address his medication needs.   Review of Systems     Objective:   Physical Exam  Constitutional: He is oriented to person, place, and time. He appears well-developed and well-nourished.  HENT:  Head: Normocephalic and atraumatic.  Cardiovascular: Normal rate, regular rhythm and normal heart sounds.   Pulmonary/Chest: Effort normal and breath sounds normal.  Neurological: He is alert and oriented to person, place, and time.  Skin: Skin is warm and dry.  Psychiatric: He has a normal mood and affect. His behavior is normal.          Assessment & Plan:  Moderate COPD - Discussed dx.  Spirometry performed on 10/23/2012 shows FVC of 82%, FEV1 of 68% and a ratio of 67%. This is consistent with moderate obstruction. And there is no significant improvement in FVC or FEV1 after albuterol treatment.  He still has some occasional course breath even with the Spiriva. Based on symptoms I would like to add an inhaled corticosteroid. We'll start Qvar. Opposite this up to his pharmacy. If another agent is preferred on his health insurance and the pharmacist can call us and we can change it. I would like to see him back in 6 months to make sure that he is doing well. If he suddenly gets worse or is having shortness of breath or additional symptoms and I encouraged him to come back sooner.  Tobacco abuse-he is  down to half a pack a day. Previously smoked a pack and a half per day. This is fantastic. He's been working with the help coach through work. Encouraged him to continue to work on cessation as this will help him more than any inhaler will cover help him. He is also being followed for scar tissue in the lungs from environmental exposure. I believe this may be through the [place]. He just had a CT scan last month which he reports showed no new nodules or lesions.

## 2012-10-31 ENCOUNTER — Encounter: Payer: Self-pay | Admitting: Family Medicine

## 2012-11-01 ENCOUNTER — Ambulatory Visit (AMBULATORY_SURGERY_CENTER): Payer: 59 | Admitting: *Deleted

## 2012-11-01 VITALS — Ht 71.0 in | Wt 222.0 lb

## 2012-11-01 DIAGNOSIS — Z1211 Encounter for screening for malignant neoplasm of colon: Secondary | ICD-10-CM

## 2012-11-01 MED ORDER — NA SULFATE-K SULFATE-MG SULF 17.5-3.13-1.6 GM/177ML PO SOLN: ORAL | Status: DC

## 2012-11-04 ENCOUNTER — Encounter: Payer: Self-pay | Admitting: Internal Medicine

## 2012-11-15 ENCOUNTER — Encounter: Payer: 59 | Admitting: Internal Medicine

## 2012-11-15 ENCOUNTER — Telehealth: Payer: Self-pay | Admitting: Internal Medicine

## 2012-11-15 NOTE — Telephone Encounter (Signed)
No charge. 

## 2012-11-28 ENCOUNTER — Encounter: Payer: Self-pay | Admitting: Internal Medicine

## 2012-11-28 ENCOUNTER — Ambulatory Visit (AMBULATORY_SURGERY_CENTER): Payer: 59 | Admitting: Internal Medicine

## 2012-11-28 VITALS — BP 130/88 | HR 74 | Temp 97.3°F | Resp 16 | Ht 71.0 in | Wt 222.0 lb

## 2012-11-28 DIAGNOSIS — K648 Other hemorrhoids: Secondary | ICD-10-CM

## 2012-11-28 DIAGNOSIS — D126 Benign neoplasm of colon, unspecified: Secondary | ICD-10-CM

## 2012-11-28 DIAGNOSIS — Z86010 Personal history of colonic polyps: Secondary | ICD-10-CM

## 2012-11-28 DIAGNOSIS — K5730 Diverticulosis of large intestine without perforation or abscess without bleeding: Secondary | ICD-10-CM

## 2012-11-28 MED ORDER — SODIUM CHLORIDE 0.9 % IV SOLN: 500.0000 mL | INTRAVENOUS | Status: DC

## 2012-11-28 NOTE — Progress Notes (Signed)
Called to room to assist during endoscopic procedure.  Patient ID and intended procedure confirmed with present staff. Received instructions for my participation in the procedure from the performing physician.  

## 2012-11-28 NOTE — Progress Notes (Addendum)
Patient did not have preoperative order for IV antibiotic SSI prophylaxis. (G8918)  Patient did not experience any of the following events: a burn prior to discharge; a fall within the facility; wrong site/side/patient/procedure/implant event; or a hospital transfer or hospital admission upon discharge from the facility. (G8907)  

## 2012-11-28 NOTE — Op Note (Signed)
[facility] [address] [place], [address]   COLONOSCOPY PROCEDURE REPORT  PATIENT: Edward Warren, Edward Warren  MR#: [id number] BIRTHDATE: 24-Jul-1955 , 56  yrs. old GENDER: Male ENDOSCOPIST: Iva Boop, MD, [facility] PROCEDURE DATE:  11/28/2012 PROCEDURE:   Colonoscopy with snare polypectomy ASA CLASS:   Class III INDICATIONS:Last colonoscopy performed 3 years ago. MEDICATIONS: propofol (Diprivan) 300mg  IV, MAC sedation, administered by CRNA, and These medications were titrated to patient response per physician's verbal order  DESCRIPTION OF PROCEDURE:   After the risks benefits and alternatives of the procedure were thoroughly explained, informed consent was obtained.  A digital rectal exam revealed no abnormalities of the rectum, A digital rectal exam revealed no prostatic nodules, and A digital rectal exam revealed the prostate was not enlarged.   The LB [id number]  endoscope was introduced through the anus and advanced to the cecum, which was identified by both the appendix and ileocecal valve. No adverse events experienced.   The quality of the prep was excellent using Suprep  The instrument was then slowly withdrawn as the colon was fully examined.   COLON FINDINGS: Two diminutive sessile polyps were found in the transverse colon and sigmoid colon.  A polypectomy was performed with a cold snare.  The resection was complete and the polyp tissue was completely retrieved.   Moderate diverticulosis was noted in the sigmoid colon.   The colon mucosa was otherwise normal.   A right colon retroflexion was performed.  Retroflexed views revealed internal hemorrhoids. The time to cecum=1 minutes 30 seconds. Withdrawal time=10 minutes 15 seconds.  The scope was withdrawn and the procedure completed. COMPLICATIONS: There were no complications.  ENDOSCOPIC IMPRESSION: 1.   Two diminutive sessile polyps were found in the transverse colon and sigmoid colon;  polypectomy was performed with a cold snare 2.   Moderate diverticulosis was noted in the sigmoid colon 3.   The colon mucosa was otherwise normal - excellent prep 4.   Internal hemorrhoids  RECOMMENDATIONS: Timing of repeat colonoscopy will be determined by pathology findings in a patient with prior polyp 2005 and adenomas 2011.   eSigned:  Iva Boop, MD, [facility] 11/28/2012 4:06 PM   cc: Nani Gasser, MD and The Patient

## 2012-11-28 NOTE — Patient Instructions (Addendum)
I found and removed 2 small polyps today - they look benign.  You also have diverticulosis and hemorrhoids.  If you have hemorrhoid problems (swelling, itching, bleeding) I am able to treat those with an in-office procedure. If you like, please call my office at [phone] to schedule an appointment and I can evaluate you further.  I will let you know pathology results and when to have another routine colonoscopy by mail.  I appreciate the opportunity to care for you. Iva Boop, MD, FACG   YOU HAD AN ENDOSCOPIC PROCEDURE TODAY AT THE  ENDOSCOPY CENTER: Refer to the procedure report that was given to you for any specific questions about what was found during the examination.  If the procedure report does not answer your questions, please call your gastroenterologist to clarify.  If you requested that your care partner not be given the details of your procedure findings, then the procedure report has been included in a sealed envelope for you to review at your convenience later.  YOU SHOULD EXPECT: Some feelings of bloating in the abdomen. Passage of more gas than usual.  Walking can help get rid of the air that was put into your GI tract during the procedure and reduce the bloating. If you had a lower endoscopy (such as a colonoscopy or flexible sigmoidoscopy) you may notice spotting of blood in your stool or on the toilet paper. If you underwent a bowel prep for your procedure, then you may not have a normal bowel movement for a few days.  DIET: Your first meal following the procedure should be a light meal and then it is ok to progress to your normal diet.  A half-sandwich or bowl of soup is an example of a good first meal.  Heavy or fried foods are harder to digest and may make you feel nauseous or bloated.  Likewise meals heavy in dairy and vegetables can cause extra gas to form and this can also increase the bloating.  Drink plenty of fluids but you should avoid alcoholic beverages for  24 hours.  ACTIVITY: Your care partner should take you home directly after the procedure.  You should plan to take it easy, moving slowly for the rest of the day.  You can resume normal activity the day after the procedure however you should NOT DRIVE or use heavy machinery for 24 hours (because of the sedation medicines used during the test).    SYMPTOMS TO REPORT IMMEDIATELY: A gastroenterologist can be reached at any hour.  During normal business hours, 8:30 AM to 5:00 PM Monday through Friday, call [phone].  After hours and on weekends, please call the GI answering service at [phone] who will take a message and have the physician on call contact you.   Following lower endoscopy (colonoscopy or flexible sigmoidoscopy):  Excessive amounts of blood in the stool  Significant tenderness or worsening of abdominal pains  Swelling of the abdomen that is new, acute  Fever of 100F or higher FOLLOW UP: If any biopsies were taken you will be contacted by phone or by letter within the next 1-3 weeks.  Call your gastroenterologist if you have not heard about the biopsies in 3 weeks.  Our staff will call the home number listed on your records the next business day following your procedure to check on you and address any questions or concerns that you may have at that time regarding the information given to you following your procedure. This is a Research officer, political party  call and so if there is no answer at the home number and we have not heard from you through the emergency physician on call, we will assume that you have returned to your regular daily activities without incident.  SIGNATURES/CONFIDENTIALITY: You and/or your care partner have signed paperwork which will be entered into your electronic medical record.  These signatures attest to the fact that that the information above on your After Visit Summary has been reviewed and is understood.  Full responsibility of the confidentiality of this discharge  information lies with you and/or your care-partner.

## 2012-11-28 NOTE — Progress Notes (Signed)
Lidocaine-40mg IV prior to Propofol InductionPropofol given over incremental dosages 

## 2012-11-29 ENCOUNTER — Telehealth: Payer: Self-pay | Admitting: *Deleted

## 2012-11-29 NOTE — Telephone Encounter (Signed)
  Follow up Call-  Call back number 11/28/2012  Post procedure Call Back phone  # [phone]  Permission to leave phone message Yes     Patient questions:  Do you have a fever, pain , or abdominal swelling? no Pain Score  0 *  Have you tolerated food without any problems? yes  Have you been able to return to your normal activities? yes  Do you have any questions about your discharge instructions: Diet   no Medications  no Follow up visit  no  Do you have questions or concerns about your Care? no  Actions: * If pain score is 4 or above: No action needed, pain <4.

## 2012-12-18 ENCOUNTER — Other Ambulatory Visit: Payer: Self-pay | Admitting: Family Medicine

## 2013-01-08 ENCOUNTER — Encounter: Payer: Self-pay | Admitting: Internal Medicine

## 2013-04-25 ENCOUNTER — Ambulatory Visit: Payer: 59 | Admitting: Family Medicine

## 2013-06-03 ENCOUNTER — Ambulatory Visit: Payer: 59 | Admitting: Family Medicine

## 2013-06-23 ENCOUNTER — Other Ambulatory Visit: Payer: 59 | Admitting: Family Medicine

## 2013-07-21 ENCOUNTER — Other Ambulatory Visit: Payer: Self-pay | Admitting: Family Medicine

## 2013-08-15 ENCOUNTER — Other Ambulatory Visit: Payer: 59 | Admitting: Family Medicine

## 2013-08-26 ENCOUNTER — Other Ambulatory Visit: Payer: 59 | Admitting: Family Medicine

## 2013-08-26 ENCOUNTER — Other Ambulatory Visit: Payer: Self-pay | Admitting: Family Medicine

## 2013-10-03 ENCOUNTER — Telehealth: Payer: Self-pay | Admitting: *Deleted

## 2013-10-03 ENCOUNTER — Other Ambulatory Visit: Payer: Self-pay | Admitting: Family Medicine

## 2013-10-03 NOTE — Telephone Encounter (Signed)
LMOM informing pt we sent 15 days worth of BP med to pharmacy and that he needs to make appt before he runs out.  Oscar La, LPN

## 2013-10-22 ENCOUNTER — Encounter: Payer: Self-pay | Admitting: Family Medicine

## 2013-10-22 ENCOUNTER — Other Ambulatory Visit: Payer: Self-pay | Admitting: *Deleted

## 2013-10-22 ENCOUNTER — Ambulatory Visit (INDEPENDENT_AMBULATORY_CARE_PROVIDER_SITE_OTHER): Payer: 59 | Admitting: Family Medicine

## 2013-10-22 VITALS — BP 126/85 | HR 86 | Ht 71.0 in | Wt 224.0 lb

## 2013-10-22 DIAGNOSIS — J441 Chronic obstructive pulmonary disease with (acute) exacerbation: Secondary | ICD-10-CM

## 2013-10-22 DIAGNOSIS — I10 Essential (primary) hypertension: Secondary | ICD-10-CM

## 2013-10-22 DIAGNOSIS — J449 Chronic obstructive pulmonary disease, unspecified: Secondary | ICD-10-CM

## 2013-10-22 DIAGNOSIS — F17200 Nicotine dependence, unspecified, uncomplicated: Secondary | ICD-10-CM

## 2013-10-22 MED ORDER — OLMESARTAN-AMLODIPINE-HCTZ 40-5-25 MG PO TABS: 1.0000 | ORAL_TABLET | Freq: Every day | ORAL | Status: DC

## 2013-10-22 MED ORDER — ALBUTEROL SULFATE (2.5 MG/3ML) 0.083% IN NEBU
2.5000 mg | INHALATION_SOLUTION | Freq: Once | RESPIRATORY_TRACT | Status: AC
  Administered 2013-10-22: 2.5 mg via RESPIRATORY_TRACT

## 2013-10-22 MED ORDER — FLUTICASONE FUROATE-VILANTEROL 100-25 MCG/INH IN AEPB: 1.0000 | INHALATION_SPRAY | Freq: Every day | RESPIRATORY_TRACT | Status: DC

## 2013-10-22 MED ORDER — DOXYCYCLINE HYCLATE 100 MG PO TABS: 100.0000 mg | ORAL_TABLET | Freq: Two times a day (BID) | ORAL | Status: DC

## 2013-10-22 MED ORDER — PREDNISONE 20 MG PO TABS: 40.0000 mg | ORAL_TABLET | Freq: Every day | ORAL | Status: DC

## 2013-10-22 NOTE — Progress Notes (Signed)
Subjective:    Patient ID: Edward Warren, male    DOB: 1955-12-09, 58 y.o.   MRN: [id number]  HPI Followup moderate COPD-he is currently on Spiriva and Qvar. Using her as needed for rescue. Still being treated for asbestosi.  Not on the QVAR.  Some SOB and wheezing for a couple fo months.  Getting yellow sputum procution with it.  No fever or chills. Coughing frequently.   Still smoking.   Tobacco abuse-has but back but ot quit. Knows he needs to quit.    Hypertension- Pt denies chest pain, SOB, dizziness, or heart palpitations.  Taking meds as directed w/o problems.  Denies medication side effects.    Review of Systems     BP 126/85  Pulse 86  Ht 5\' 11"  (1.803 m)  Wt 224 lb (101.606 kg)  BMI 31.26 kg/m2  SpO2 95%    No Known Allergies  Past Medical History  Diagnosis Date  . COPD (chronic obstructive pulmonary disease)     PFTS: 7-10: TEV1 ration of 62, FEV1 63% and FVC 80% (mod, class II)  . History of shingles   . Personal history of colonic adenomas 07/05/2012  . Hypertension   . Emphysema of lung     Past Surgical History  Procedure Laterality Date  . Colonoscopy  multiple    History   Social History  . Marital Status: Married    Spouse Name: N/A    Number of Children: 1  . Years of Education: N/A   Occupational History  .      [employer]   Social History Main Topics  . Smoking status: Current Every Day Smoker -- 0.50 packs/day    Types: Cigarettes  . Smokeless tobacco: Never Used  . Alcohol Use: 1.0 oz/week    2 drink(s) per week  . Drug Use: No  . Sexual Activity: Not on file   Other Topics Concern  . Not on file   Social History Narrative   No regular exercise.     Family History  Problem Relation Age of Onset  . Stroke Maternal Grandfather   . Breast cancer Maternal Aunt   . Cancer Maternal Uncle     throat  . Colon cancer Neg Hx     Outpatient Encounter Prescriptions as of 10/22/2013  Medication Sig  .  Olmesartan-Amlodipine-HCTZ (TRIBENZOR) 40-5-25 MG TABS Take 1 tablet by mouth daily.  [name] Kitchen PROAIR HFA 108 (90 BASE) MCG/ACT inhaler INHALE 2 PUFFS INTO THE LUNGS EVERY 6 (SIX) HOURS AS NEEDED.  [name] Kitchen tiotropium (SPIRIVA) 18 MCG inhalation capsule Place 1 capsule (18 mcg total) into inhaler and inhale daily.  . [DISCONTINUED] beclomethasone (QVAR) 40 MCG/ACT inhaler Inhale 1-2 puffs into the lungs 2 (two) times daily.  . [DISCONTINUED] Olmesartan-Amlodipine-HCTZ (TRIBENZOR) 40-5-25 MG TABS Take 1 tablet by mouth daily. MUST MAKE APPOINTMENT BEFORE ANY FURTHER REFILLS  . doxycycline (VIBRA-TABS) 100 MG tablet Take 1 tablet (100 mg total) by mouth 2 (two) times daily.  . Fluticasone Furoate-Vilanterol (BREO ELLIPTA) 100-25 MCG/INH AEPB Inhale 1 Inhaler into the lungs daily.  . Fluticasone Furoate-Vilanterol (BREO ELLIPTA) 100-25 MCG/INH AEPB Inhale 1 Inhaler into the lungs daily.  . predniSONE (DELTASONE) 20 MG tablet Take 2 tablets (40 mg total) by mouth daily.       Objective:   Physical Exam  Constitutional: He is oriented to person, place, and time. He appears well-developed and well-nourished.  HENT:  Head: Normocephalic and atraumatic.  Right Ear: External ear normal.  Left  Ear: External ear normal.  Nose: Nose normal.  Mouth/Throat: Oropharynx is clear and moist.  TMs and canals are clear.   Eyes: Conjunctivae and EOM are normal. Pupils are equal, round, and reactive to light.  Neck: Neck supple. No thyromegaly present.  Cardiovascular: Normal rate and normal heart sounds.   Pulmonary/Chest: Effort normal. He has wheezes.  Diffuse wheezing or rhonchi  Lymphadenopathy:    He has no cervical adenopathy.  Neurological: He is alert and oriented to person, place, and time.  Skin: Skin is warm and dry.  Psychiatric: He has a normal mood and affect.          Assessment & Plan:  COPD, moderate- exacerbation.  Given neb treatment here in the office. We'll treat with five-day course of  prednisone and antibiotic, doxycycline. I would like to see him back in one month. He did have a breathing test done through work and says he will drop a copy off for [place] to review. His last spirometry in our office was a year ago. I let him back in one month to make sure that he is improving. It is not at least 75% better in 5 days then asked him to call the office back and consider getting a chest x-ray. He still had significant rhonchi on exam after his nebulized treatment with albuterol.  HTN - well controlled on current regimen. Refill sent to pharmacy. Followup in 6 months.  Tobacco abuse-encourage cessation. He has cut back encourage him to try to work on quitting. Will to discuss further tx options if he is open to it.  Need to quit smoking, exp with his underlying asbestosis.

## 2013-10-22 NOTE — Patient Instructions (Signed)
Chronic Obstructive Pulmonary Disease  Chronic obstructive pulmonary disease (COPD) is a common lung condition in which airflow from the lungs is limited. COPD is a general term that can be used to describe many different lung problems that limit airflow, including both chronic bronchitis and emphysema.  If you have COPD, your lung function will probably never return to normal, but there are measures you can take to improve lung function and make yourself feel better.   CAUSES   · Smoking (common).    · Exposure to secondhand smoke.    · Genetic problems.  · Chronic inflammatory lung diseases or recurrent infections.  SYMPTOMS   · Shortness of breath, especially with physical activity.    · Deep, persistent (chronic) cough with a large amount of thick mucus.    · Wheezing.    · Rapid breaths (tachypnea).    · Gray or bluish discoloration (cyanosis) of the skin, especially in fingers, toes, or lips.    · Fatigue.    · Weight loss.    · Frequent infections or episodes when breathing symptoms become much worse (exacerbations).    · Chest tightness.  DIAGNOSIS   Your healthcare provider will take a medical history and perform a physical examination to make the initial diagnosis.  Additional tests for COPD may include:   · Lung (pulmonary) function tests.  · Chest X-ray.  · CT scan.  · Blood tests.  TREATMENT   Treatment available to help you feel better when you have COPD include:   · Inhaler and nebulizer medicines. These help manage the symptoms of COPD and make your breathing more comfortable  · Supplemental oxygen. Supplemental oxygen is only helpful if you have a low oxygen level in your blood.    · Exercise and physical activity. These are beneficial for nearly all people with COPD. Some people may also benefit from a pulmonary rehabilitation program.  HOME CARE INSTRUCTIONS   · Take all medicines (inhaled or pills) as directed by your health care provider.  · Only take over-the-counter or prescription medicines  for pain, fever, or discomfort as directed by your health care provider.    · Avoid over-the-counter medicines or cough syrups that dry up your airway (such as antihistamines) and slow down the elimination of secretions unless instructed otherwise by your healthcare provider.    · If you are a smoker, the most important thing that you can do is stop smoking. Continuing to smoke will cause further lung damage and breathing trouble. Ask your health care provider for help with quitting smoking. He or she can direct you to community resources or hospitals that provide support.  · Avoid exposure to irritants such as smoke, chemicals, and fumes that aggravate your breathing.  · Use oxygen therapy and pulmonary rehabilitation if directed by your health care provider. If you require home oxygen therapy, ask your healthcare provider whether you should purchase a pulse oximeter to measure your oxygen level at home.    · Avoid contact with individuals who have a contagious illness.  · Avoid extreme temperature and humidity changes.  · Eat healthy foods. Eating smaller, more frequent meals and resting before meals may help you maintain your strength.  · Stay active, but balance activity with periods of rest. Exercise and physical activity will help you maintain your ability to do things you want to do.  · Preventing infection and hospitalization is very important when you have COPD. Make sure to receive all the vaccines your health care provider recommends, especially the pneumococcal and influenza vaccines. Ask your healthcare provider whether you   need a pneumonia vaccine.  · Learn and use relaxation techniques to manage stress.  · Learn and use controlled breathing techniques as directed by your health care provider. Controlled breathing techniques include:    · Pursed lip breathing. Start by breathing in (inhaling) through your nose for 1 second. Then, purse your lips as if you were going to whistle and breathe out (exhale)  through the pursed lips for 2 seconds.    · Diaphragmatic breathing. Start by putting one hand on your abdomen just above your waist. Inhale slowly through your nose. The hand on your abdomen should move out. Then purse your lips and exhale slowly. You should be able to feel the hand on your abdomen moving in as you exhale.    · Learn and use controlled coughing to clear mucus from your lungs. Controlled coughing is a series of short, progressive coughs. The steps of controlled coughing are:    1. Lean your head slightly forward.    2. Breathe in deeply using diaphragmatic breathing.    3. Try to hold your breath for 3 seconds.    4. Keep your mouth slightly open while coughing twice.    5. Spit any mucus out into a tissue.    6. Rest and repeat the steps once or twice as needed.  SEEK MEDICAL CARE IF:   · You are coughing up more mucus than usual.    · There is a change in the color or thickness of your mucus.    · Your breathing is more labored than usual.    · Your breathing is faster than usual.    SEEK IMMEDIATE MEDICAL CARE IF:   · You have shortness of breath while you are resting.    · You have shortness of breath that prevents you from:  · Being able to talk.    · Performing your usual physical activities.    · You have chest pain lasting longer than 5 minutes.    · Your skin color is more cyanotic than usual.  · You measure low oxygen saturations for longer than 5 minutes with a pulse oximeter.  MAKE SURE YOU:   · Understand these instructions.  · Will watch your condition.  · Will get help right away if you are not doing well or get worse.  Document Released: 02/15/2005 Document Revised: 02/26/2013 Document Reviewed: 01/02/2013  [employer]® Patient Information ©2014 [employer].

## 2013-10-30 ENCOUNTER — Telehealth: Payer: Self-pay | Admitting: *Deleted

## 2013-10-30 NOTE — Telephone Encounter (Signed)
Pt's wife called concerned about her husband. He then got on the phone and said that the prednisone has caused him to have nightmares and suicidal thoughts and has since stopped taking this medication. He stated that he picked up this medication a few days after it was prescribed. Dr. Madilyn Fireman stated that it was fine for him to d/c this med.Edward Warren

## 2013-11-06 ENCOUNTER — Encounter: Payer: Self-pay | Admitting: Family Medicine

## 2013-11-06 ENCOUNTER — Ambulatory Visit (INDEPENDENT_AMBULATORY_CARE_PROVIDER_SITE_OTHER): Payer: 59 | Admitting: Family Medicine

## 2013-11-06 ENCOUNTER — Ambulatory Visit (INDEPENDENT_AMBULATORY_CARE_PROVIDER_SITE_OTHER): Payer: 59

## 2013-11-06 VITALS — BP 116/77 | HR 98 | Ht 71.0 in | Wt 223.0 lb

## 2013-11-06 DIAGNOSIS — J449 Chronic obstructive pulmonary disease, unspecified: Secondary | ICD-10-CM

## 2013-11-06 DIAGNOSIS — R0602 Shortness of breath: Secondary | ICD-10-CM

## 2013-11-06 NOTE — Progress Notes (Signed)
   Subjective:    Patient ID: Edward Warren, male    DOB: 03-11-1956, 58 y.o.   MRN: [id number]  HPI Here for follow up COPD. He was seen about a week and half ago for COPD exacerbation. He's currently using Breo and Spiriva.  He feels like he is about 75-80% improved from his recent flare. Has appt in august with  Pulmonolgist in [place].  He still experiences a chronic cough but he is a half a pack a day smoker. He does get a breathing test and hearing screening done through work yearly and brought in a copy of the document this as he has mild obstruction. Did not have the actual results.   Review of Systems     Objective:   Physical Exam  Constitutional: He is oriented to person, place, and time. He appears well-developed and well-nourished.  HENT:  Head: Normocephalic and atraumatic.  Cardiovascular: Normal rate, regular rhythm and normal heart sounds.   Pulmonary/Chest: Effort normal and breath sounds normal.  Neurological: He is alert and oriented to person, place, and time.  Skin: Skin is warm and dry.  Psychiatric: He has a normal mood and affect. His behavior is normal.          Assessment & Plan:  COPD-moderate. We did repeat his spirometry today. Unfortunately the results are somewhat confusing. His FVC was 504%, FEV1 139% and ratio of 22%. These results were quite different from previous year where his FVC was 82% and FEV1 was 68% with a ratio of 67%. I would like to actually have pulmonary take a look at this and see what they think. I did go ahead and order a chest x-ray today since it has been several years since she's had one. Chest x-ray was normal. For now continue with Spiriva and Breo. We'll see how he does over the next 3-4 months. He does have a CT scan and followup with pulmonary through the [place] in August.

## 2013-12-04 ENCOUNTER — Encounter: Payer: 59 | Admitting: Family Medicine

## 2013-12-08 ENCOUNTER — Encounter: Payer: Self-pay | Admitting: Family Medicine

## 2013-12-08 ENCOUNTER — Encounter: Payer: 59 | Admitting: Family Medicine

## 2013-12-08 ENCOUNTER — Ambulatory Visit (INDEPENDENT_AMBULATORY_CARE_PROVIDER_SITE_OTHER): Payer: 59 | Admitting: Family Medicine

## 2013-12-08 VITALS — BP 145/94 | HR 98 | Ht 72.0 in | Wt 225.0 lb

## 2013-12-08 DIAGNOSIS — J449 Chronic obstructive pulmonary disease, unspecified: Secondary | ICD-10-CM

## 2013-12-08 DIAGNOSIS — J441 Chronic obstructive pulmonary disease with (acute) exacerbation: Secondary | ICD-10-CM

## 2013-12-08 MED ORDER — IPRATROPIUM-ALBUTEROL 18-103 MCG/ACT IN AERO: 2.0000 | INHALATION_SPRAY | Freq: Four times a day (QID) | RESPIRATORY_TRACT | Status: DC | PRN

## 2013-12-08 NOTE — Progress Notes (Signed)
CC: Edward Warren is a 58 y.o. male is here for Shortness of Breath   Subjective: HPI:  Complains of productive cough that has been present past week worsening on a daily basis now moderate to severe in severity. Worse first thing in the morning and when in hot environments. Slightly improved with albuterol. Continues on Spiriva and Breo without any recent missed doses. Reports mild shortness of breath and mild wheezing present all hours of the day. Denies fevers, chills, blood in sputum, chest pain, confusion, motor or sensory disturbances nor edema.   Review Of Systems Outlined In HPI  Past Medical History  Diagnosis Date  . COPD (chronic obstructive pulmonary disease)     PFTS: 7-10: TEV1 ration of 62, FEV1 63% and FVC 80% (mod, class II)  . History of shingles   . Personal history of colonic adenomas 07/05/2012  . Hypertension   . Emphysema of lung     Past Surgical History  Procedure Laterality Date  . Colonoscopy  multiple   Family History  Problem Relation Age of Onset  . Stroke Maternal Grandfather   . Breast cancer Maternal Aunt   . Cancer Maternal Uncle     throat  . Colon cancer Neg Hx     History   Social History  . Marital Status: Married    Spouse Name: N/A    Number of Children: 1  . Years of Education: N/A   Occupational History  .      [employer]   Social History Main Topics  . Smoking status: Current Every Day Smoker -- 0.50 packs/day    Types: Cigarettes  . Smokeless tobacco: Never Used  . Alcohol Use: 1.0 oz/week    2 drink(s) per week  . Drug Use: No  . Sexual Activity: Not on file   Other Topics Concern  . Not on file   Social History Narrative   No regular exercise.      Objective: BP 145/94  Pulse 98  Ht 6' (1.829 m)  Wt 225 lb (102.059 kg)  BMI 30.51 kg/m2  SpO2 97%  General: Alert and Oriented, No Acute Distress HEENT: Pupils equal, round, reactive to light. Conjunctivae clear.  External ears unremarkable, canals  clear with intact TMs with appropriate landmarks.  Middle ear appears open without effusion. Pink inferior turbinates.  Moist mucous membranes, pharynx without inflammation nor lesions.  Neck supple without palpable lymphadenopathy nor abnormal masses. Lungs: On first examination he has distant breath sounds with mild expiratory wheezing in the lower lung fields. Following DuoNeb treatment Clear to auscultation bilaterally, no wheezing/ronchi/rales.  Comfortable work of breathing. Good air movement. Cardiac: Regular rate and rhythm. Normal S1/S2.  No murmurs, rubs, nor gallops.   Extremities: No peripheral edema.  Strong peripheral pulses.  Mental Status: No depression, anxiety, nor agitation. Skin: Warm and dry.  Assessment & Plan: Edward Warren was seen today for shortness of breath.  Diagnoses and associated orders for this visit:  COPD  COPD exacerbation - albuterol-ipratropium (COMBIVENT) 18-103 MCG/ACT inhaler; Inhale 2 puffs into the lungs every 6 (six) hours as needed for wheezing or shortness of breath.    COPD exacerbation: He achieved percent symptom resolution with a DuoNeb treatment, I've given her prescription for Combivent to be used in place of albuterol alone. He is not a candidate for prednisone given a serious mental intolerance.  He already has a complete physical exam scheduled with his PCP later this week  Return in about 3 days (around  12/11/2013).  

## 2013-12-11 ENCOUNTER — Ambulatory Visit (INDEPENDENT_AMBULATORY_CARE_PROVIDER_SITE_OTHER): Payer: 59 | Admitting: Family Medicine

## 2013-12-11 ENCOUNTER — Telehealth: Payer: Self-pay | Admitting: Family Medicine

## 2013-12-11 ENCOUNTER — Encounter: Payer: Self-pay | Admitting: Family Medicine

## 2013-12-11 VITALS — BP 126/82 | HR 102 | Ht 71.0 in | Wt 226.0 lb

## 2013-12-11 DIAGNOSIS — Z0000 Encounter for general adult medical examination without abnormal findings: Secondary | ICD-10-CM

## 2013-12-11 DIAGNOSIS — F17200 Nicotine dependence, unspecified, uncomplicated: Secondary | ICD-10-CM

## 2013-12-11 DIAGNOSIS — J441 Chronic obstructive pulmonary disease with (acute) exacerbation: Secondary | ICD-10-CM

## 2013-12-11 NOTE — Telephone Encounter (Signed)
Encouraged him to get a copy of vaccine since he is going to the [place] next month. As we need to make sure that he has had a pneumonia vaccine.

## 2013-12-11 NOTE — Progress Notes (Signed)
Subjective:    Patient ID: Edward Warren, male    DOB: [birth year], 58 y.o.   MRN: [id number]  HPI Works as a Freight forwarder at [employer] for [employer] and the work enviroment is incredibily hot. Steam is produced to create power and create a very hot environment. Usually over 100 degrees.  Says he just can't breath well in the work environment. Was here 3 days ago for COPD exacerbation. Prior to that he had a COPD exacerbation in June about a month ago. Says coughs so hard at time he sees spots in his vision.  He is on maximal therapy at this point and is intolerant to steroids. He has an appointment coming up in August with the [place]. He would also like to see a local pulmonologist for an opinion about his COPD. He feels like he is at a point where he is unable to continue to do his job.  COPD Exacerbation  -he's actually feeling some better today compared to 3 days ago when he was seen. Combivent was added for when necessary use. He still waking some but overall feels much better. No fevers chills or sweats. He is intolerant to oral prednisone. Needs refill on spiriva. It is causing him $300 for 90 day supply.   Review of Systems Hypertensive review of systems is negative except for components in the history of present illness.  BP 126/82  Pulse 102  Ht 5\' 11"  (1.803 m)  Wt 226 lb (102.513 kg)  BMI 31.53 kg/m2  SpO2 98%    Allergies  Allergen Reactions  . Prednisone Other (See Comments)    Nightmares and suicidal thoughts    Past Medical History  Diagnosis Date  . COPD (chronic obstructive pulmonary disease)     PFTS: 7-10: TEV1 ration of 62, FEV1 63% and FVC 80% (mod, class II)  . History of shingles   . Personal history of colonic adenomas 07/05/2012  . Hypertension   . Emphysema of lung     Past Surgical History  Procedure Laterality Date  . Colonoscopy  multiple    History   Social History  . Marital Status: Married    Spouse Name: N/A    Number of Children: 1   . Years of Education: N/A   Occupational History  .      [employer]   Social History Main Topics  . Smoking status: Current Every Day Smoker -- 0.50 packs/day    Types: Cigarettes  . Smokeless tobacco: Never Used  . Alcohol Use: 1.0 oz/week    2 drink(s) per week  . Drug Use: No  . Sexual Activity: Not on file   Other Topics Concern  . Not on file   Social History Narrative   No regular exercise.     Family History  Problem Relation Age of Onset  . Stroke Maternal Grandfather   . Breast cancer Maternal Aunt   . Cancer Maternal Uncle     throat  . Colon cancer Neg Hx     Outpatient Encounter Prescriptions as of 12/11/2013  Medication Sig  . albuterol-ipratropium (COMBIVENT) 18-103 MCG/ACT inhaler Inhale 2 puffs into the lungs every 6 (six) hours as needed for wheezing or shortness of breath.  . Fluticasone Furoate-Vilanterol (BREO ELLIPTA) 100-25 MCG/INH AEPB Inhale 1 Inhaler into the lungs daily.  . Olmesartan-Amlodipine-HCTZ (TRIBENZOR) 40-5-25 MG TABS Take 1 tablet by mouth daily.  [name] Kitchen PROAIR HFA 108 (90 BASE) MCG/ACT inhaler INHALE 2 PUFFS INTO THE LUNGS  EVERY 6 (SIX) HOURS AS NEEDED.  [name] Kitchen tiotropium (SPIRIVA) 18 MCG inhalation capsule Place 1 capsule (18 mcg total) into inhaler and inhale daily.          Objective:   Physical Exam  Constitutional: He is oriented to person, place, and time. He appears well-developed and well-nourished.  HENT:  Head: Normocephalic and atraumatic.  Right Ear: External ear normal.  Left Ear: External ear normal.  Nose: Nose normal.  Mouth/Throat: Oropharynx is clear and moist.  Eyes: Conjunctivae and EOM are normal. Pupils are equal, round, and reactive to light.  Neck: Normal range of motion. Neck supple. No thyromegaly present.  Cardiovascular: Normal rate, regular rhythm, normal heart sounds and intact distal pulses.   Pulmonary/Chest: Effort normal and breath sounds normal.  Mild expiratory wheezing.   Abdominal: Soft.  Bowel sounds are normal. He exhibits no distension and no mass. There is no tenderness. There is no rebound and no guarding.  Musculoskeletal: Normal range of motion.  Lymphadenopathy:    He has no cervical adenopathy.  Neurological: He is alert and oriented to person, place, and time. He has normal reflexes.  Skin: Skin is warm and dry.  Psychiatric: He has a normal mood and affect. His behavior is normal. Judgment and thought content normal.    He does have a dermatofibroma on the left lower leg near the ankle and on the right forearm and on his mid chest. They're not bothersome, tender, or itchy. They have not changed in size and have been there for quite a while.      Assessment & Plan:  CPE- exam is fairly normal today. He did have a few dermatofibroma is. Gave him reassurance. Lab slip provided today. Colonoscopy and tetanus are up-to-date. He is due for pneumonia vaccine based on his smoking history and COPD. Not sure if he's had done at the [place]. Encouraged him to get a copy of vaccine since he is going to the [place] next month.  COPD - encouraged smoking cessation.  Reminded him that this is the most important thing he can do. He says he's cut down to about 5 cigarettes per day. Last time I saw him he was still smoking about half a pack per day. He is hoping that in the next few weeks she can quit completely. He feels like the stressors at work really drive him to want to smoke. I would be happy to refer him to a local pulmonologist. I did go ahead and write him out of work for the next 6 weeks so that he can get in with the pulmonologist here as well as keep his [place] appointment in August.   Discussed need for shingles vaccine.  Handout provided. Encouraged him to check with his insurance coverage.

## 2013-12-11 NOTE — Telephone Encounter (Signed)
Edward Warren, Edward Warren

## 2013-12-12 ENCOUNTER — Other Ambulatory Visit: Payer: Self-pay | Admitting: Family Medicine

## 2013-12-12 LAB — COMPLETE METABOLIC PANEL WITH GFR
ALT: 21 U/L (ref 0–53)
AST: 21 U/L (ref 0–37)
Albumin: 4.5 g/dL (ref 3.5–5.2)
Alkaline Phosphatase: 84 U/L (ref 39–117)
BILIRUBIN TOTAL: 0.7 mg/dL (ref 0.2–1.2)
BUN: 18 mg/dL (ref 6–23)
CO2: 23 mEq/L (ref 19–32)
CREATININE: 1.07 mg/dL (ref 0.50–1.35)
Calcium: 9.7 mg/dL (ref 8.4–10.5)
Chloride: 102 mEq/L (ref 96–112)
GFR, EST AFRICAN AMERICAN: 87 mL/min
GFR, EST NON AFRICAN AMERICAN: 75 mL/min
GLUCOSE: 100 mg/dL — AB (ref 70–99)
Potassium: 3.7 mEq/L (ref 3.5–5.3)
Sodium: 137 mEq/L (ref 135–145)
Total Protein: 7.4 g/dL (ref 6.0–8.3)

## 2013-12-12 LAB — LIPID PANEL
CHOLESTEROL: 160 mg/dL (ref 0–200)
HDL: 75 mg/dL (ref >39)
LDL CALC: 54 mg/dL (ref 0–99)
TRIGLYCERIDES: 155 mg/dL — AB (ref <150)
Total CHOL/HDL Ratio: 2.1 Ratio
VLDL: 31 mg/dL (ref 0–40)

## 2013-12-12 LAB — PSA: PSA: 1.01 ng/mL (ref <=4.00)

## 2013-12-16 NOTE — Telephone Encounter (Signed)
Left message for patient to pick up vaccine record from the [place].

## 2013-12-17 NOTE — Telephone Encounter (Signed)
Left message for patient to return call.

## 2013-12-18 ENCOUNTER — Institutional Professional Consult (permissible substitution): Payer: 59 | Admitting: Pulmonary Disease

## 2013-12-24 NOTE — Telephone Encounter (Signed)
Pt informed.Edward Warren  

## 2013-12-25 ENCOUNTER — Telehealth: Payer: Self-pay | Admitting: Pulmonary Disease

## 2013-12-25 ENCOUNTER — Ambulatory Visit (INDEPENDENT_AMBULATORY_CARE_PROVIDER_SITE_OTHER): Payer: 59 | Admitting: Pulmonary Disease

## 2013-12-25 ENCOUNTER — Encounter: Payer: Self-pay | Admitting: Pulmonary Disease

## 2013-12-25 VITALS — BP 116/62 | HR 88 | Temp 97.9°F | Ht 71.0 in | Wt 224.1 lb

## 2013-12-25 DIAGNOSIS — J441 Chronic obstructive pulmonary disease with (acute) exacerbation: Secondary | ICD-10-CM

## 2013-12-25 DIAGNOSIS — I050 Rheumatic mitral stenosis: Secondary | ICD-10-CM

## 2013-12-25 NOTE — Telephone Encounter (Signed)
I noted that he had an echo stress test in 2013 that showed mitral stenosis. This may also be causing his dyspnea If he still seeing a cardiologist ?

## 2013-12-25 NOTE — Patient Instructions (Signed)
You have moderate (stage 2 ) COPD  Send me asbestos records Stay on breo & spiriva daily Use albuterol as needed You have to [place] smoking ! Trial of nicotine patch daily Mucinex 600  Twice daily x 2 weeks You will benefit from pulmonary rehab program

## 2013-12-25 NOTE — Assessment & Plan Note (Signed)
His dyspnea is out of proportion to his degree of COPD and maybe related to mitral stenosis. Should have further workup for this

## 2013-12-25 NOTE — Progress Notes (Signed)
Subjective:    Patient ID: Edward Warren, male    DOB: 10-Jul-1955, 58 y.o.   MRN: [id number]  HPI 58 year old smoker presents for evaluation of COPD. 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 Spirometry in June 2014 showed an FEV1 of 2.57-68% with a ratio of 67, and FVC of 82% suggestive of moderate stage II COPD. PFTs in 11/2009 showed a DLCO of 64% and an FEV1 of 63% suggestive of moderate impairment. He is also being evaluated for asbestosis by Dr. Charlett Blake. CT chest without contrast in 09/2012 and in 06/2011 did not show any pleural or interstitial abnormality. He frankly admits that he is here for evaluation of disability. His work 84 years for [employer]. He recently had a COPD exacerbation and has been taken out of work from 7/20- 02/03/14. 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 This makes him more short of breath and makes him wheeze. He is now maintained on a regimen of Spiriva and Brio. He reports a morning cough productive of clear activities of daily living. He smoked about a pack per day since his teenage years-about 40 pack years-and more recently has been able to cut down to 5 cigarettes per day. He tried Chantix, but this caused her dreams and made him depressed so he does not want to continue this. He also has appointments at the [place] at [place], but wants to establish with me as his local pulmonologist. He seriously believes that he cannot continue working at his job  Chest x-ray on 11/07/11 did not show any infiltrates or effusions Prednisone that he recently took caused him suicidal thoughts and he stopped this after 3 days  I also noted severe mitral stenosis was noted on an echo stress test in 06/2011   Past Medical History  Diagnosis Date  . COPD (chronic obstructive pulmonary disease)     PFTS: 7-10: TEV1 ration of 62, FEV1 63% and FVC 80% (mod, class II)  . History of  shingles   . Personal history of colonic adenomas 07/05/2012  . Hypertension   . Emphysema of lung    Past Surgical History  Procedure Laterality Date  . Colonoscopy  multiple    Allergies  Allergen Reactions  . Prednisone Other (See Comments)    Nightmares and suicidal thoughts    History   Social History  . Marital Status: Married    Spouse Name: N/A    Number of Children: 1  . Years of Education: N/A   Occupational History  . disabled     [employer]   Social History Main Topics  . Smoking status: Current Every Day Smoker -- 0.10 packs/day for 42 years    Types: Cigarettes  . Smokeless tobacco: Never Used  . Alcohol Use: 1.0 oz/week    2 drink(s) per week  . Drug Use: No  . Sexual Activity: Not on file   Other Topics Concern  . Not on file   Social History Narrative   No regular exercise.     Family History  Problem Relation Age of Onset  . Stroke Maternal Grandfather   . Breast cancer Maternal Aunt   . Cancer Maternal Uncle     throat  . Colon cancer Neg Hx     Review of Systems  Constitutional: Negative for fever and unexpected weight change.  HENT: Positive for congestion. Negative for dental problem,  ear pain, nosebleeds, postnasal drip, rhinorrhea, sinus pressure, sneezing, sore throat and trouble swallowing.   Eyes: Negative for redness and itching.  Respiratory: Positive for cough, chest tightness, shortness of breath and wheezing.   Cardiovascular: Negative for palpitations and leg swelling.  Gastrointestinal: Negative for nausea and vomiting.  Genitourinary: Negative for dysuria.  Musculoskeletal: Negative for joint swelling.  Skin: Negative for rash.  Neurological: Negative for headaches.  Hematological: Does not bruise/bleed easily.  Psychiatric/Behavioral: Negative for dysphoric mood. The patient is not nervous/anxious.        Objective:   Physical Exam  Gen. Pleasant, obese, in no distress, normal affect ENT - no lesions, no post  nasal drip, class 2-3 airway Neck: No JVD, no thyromegaly, no carotid bruits Lungs: no use of accessory muscles, no dullness to percussion, decreased without rales or rhonchi  Cardiovascular: Rhythm regular, heart sounds  normal, no murmurs or gallops, no peripheral edema Abdomen: soft and non-tender, no hepatosplenomegaly, BS normal. Musculoskeletal: No deformities, no cyanosis or clubbing Neuro:  alert, non focal, no tremors       Assessment & Plan:

## 2013-12-25 NOTE — Assessment & Plan Note (Addendum)
You have moderate (stage 2 ) COPD  Send me asbestos records Stay on breo & spiriva daily Use albuterol as needed You have to [place] smoking ! Trial of nicotine patch daily Mucinex 600  Twice daily x 2 weeks You will benefit from pulmonary rehab program I frankly did explain to him that stage II COPD would not qualify him for long term disability. But I could certainly give him a letter explaining his limitations to help him at work

## 2013-12-26 ENCOUNTER — Telehealth: Payer: Self-pay | Admitting: Family Medicine

## 2013-12-26 DIAGNOSIS — I050 Rheumatic mitral stenosis: Secondary | ICD-10-CM

## 2013-12-26 NOTE — Telephone Encounter (Signed)
I will follow through and check with him.  He might be through the [place].  We will call him today and if not will get him scheduled.  Thank you for seeing him.

## 2013-12-26 NOTE — Telephone Encounter (Signed)
Pt informed and would like for [place] to set this up for him.

## 2013-12-26 NOTE — Telephone Encounter (Signed)
Received note from pulmonary. Needs repeat echo to look at his mitral valve. We can schedule this for him if would like or if he wants to go through [place] that is fine too.

## 2013-12-29 NOTE — Telephone Encounter (Signed)
Order placed

## 2013-12-30 ENCOUNTER — Telehealth: Payer: Self-pay | Admitting: *Deleted

## 2013-12-30 NOTE — Telephone Encounter (Signed)
lvm regarding pt's disability claim information. Would like to know if his MR have been faxed. I spoke w/front ofc and was informed that health port was here on yesterday and that it will take up to 1 wk to process this information. Called and left this information on her vm.Edward Warren, Edward Warren

## 2014-01-29 ENCOUNTER — Telehealth: Payer: Self-pay

## 2014-01-29 MED ORDER — NICOTINE 21 MG/24HR TD PT24: 21.0000 mg | MEDICATED_PATCH | Freq: Every day | TRANSDERMAL | Status: DC

## 2014-01-29 NOTE — Telephone Encounter (Signed)
Edward Warren would like a prescription for nicotine patches sent to [facility]. He smokes a pack daily.

## 2014-01-29 NOTE — Telephone Encounter (Signed)
Patient advised.

## 2014-01-29 NOTE — Telephone Encounter (Signed)
[facility] will send over 21mg  patch. Use for 6 weeks and then will drop to 14mg . Will need to call back when ready to drop to 14mg . Encouarged him to use 1800-QUIT- NOW as well.

## 2014-02-09 ENCOUNTER — Emergency Department (INDEPENDENT_AMBULATORY_CARE_PROVIDER_SITE_OTHER)
Admission: EM | Admit: 2014-02-09 | Discharge: 2014-02-09 | Disposition: A | Discharge status: Home / Self Care | Payer: 59 | Source: Home / Self Care | Attending: Emergency Medicine | Admitting: Emergency Medicine

## 2014-02-09 ENCOUNTER — Emergency Department (INDEPENDENT_AMBULATORY_CARE_PROVIDER_SITE_OTHER): Payer: 59

## 2014-02-09 ENCOUNTER — Encounter: Payer: Self-pay | Admitting: Emergency Medicine

## 2014-02-09 DIAGNOSIS — M25462 Effusion, left knee: Secondary | ICD-10-CM

## 2014-02-09 DIAGNOSIS — S8002XA Contusion of left knee, initial encounter: Secondary | ICD-10-CM

## 2014-02-09 DIAGNOSIS — S8000XA Contusion of unspecified knee, initial encounter: Secondary | ICD-10-CM

## 2014-02-09 DIAGNOSIS — M25469 Effusion, unspecified knee: Secondary | ICD-10-CM

## 2014-02-09 MED ORDER — HYDROCODONE-ACETAMINOPHEN 5-325 MG PO TABS: 1.0000 | ORAL_TABLET | ORAL | Status: DC | PRN

## 2014-02-09 MED ORDER — MELOXICAM 15 MG PO TABS: 15.0000 mg | ORAL_TABLET | Freq: Every day | ORAL | Status: DC

## 2014-02-09 NOTE — ED Provider Notes (Signed)
CSN: [id number]     Arrival date & time 02/09/14  0940 History   First MD Initiated Contact with Patient 02/09/14 1019     Chief Complaint  Patient presents with  . Knee Injury   (Consider location/radiation/quality/duration/timing/severity/associated sxs/prior Treatment) HPI Patient reports running into coffee table several days ago and hitting left knee; it is now inflamed and edematous and the pain continues. Complains of worsening moderate sharp and dull pain left knee with swelling left knee. No radiation of pain. Pain exacerbated by attempts to weight-bear, he can weight-bear only with severe pain. No fever or chills or redness No rash History of left knee injuries in the distant past. Past Medical History  Diagnosis Date  . COPD (chronic obstructive pulmonary disease)     PFTS: 7-10: TEV1 ration of 62, FEV1 63% and FVC 80% (mod, class II)  . History of shingles   . Personal history of colonic adenomas 07/05/2012  . Hypertension   . Emphysema of lung    Past Surgical History  Procedure Laterality Date  . Colonoscopy  multiple   Family History  Problem Relation Age of Onset  . Stroke Maternal Grandfather   . Breast cancer Maternal Aunt   . Cancer Maternal Uncle     throat  . Colon cancer Neg Hx    History  Substance Use Topics  . Smoking status: Current Every Day Smoker -- 0.10 packs/day for 42 years    Types: Cigarettes  . Smokeless tobacco: Never Used  . Alcohol Use: 1.0 oz/week    2 drink(s) per week    Review of Systems  All other systems reviewed and are negative.   Allergies  Prednisone  Home Medications   Prior to Admission medications   Medication Sig Start Date End Date Taking? Authorizing Provider  Fluticasone Furoate-Vilanterol (BREO ELLIPTA) 100-25 MCG/INH AEPB Inhale 1 Inhaler into the lungs daily. 10/22/13   Hali Marry, MD  HYDROcodone-acetaminophen (NORCO/VICODIN) 5-325 MG per tablet Take 1-2 tablets by mouth every 4 (four) hours as  needed for severe pain. Take with food. 02/09/14   Jacqulyn Cane, MD  meloxicam (MOBIC) 15 MG tablet Take 1 tablet (15 mg total) by mouth daily. For Pain/Inflammation. Take with food. 02/09/14   Jacqulyn Cane, MD  nicotine (NICODERM CQ) 21 mg/24hr patch Place 1 patch (21 mg total) onto the skin daily. 01/29/14   Hali Marry, MD  Olmesartan-Amlodipine-HCTZ [facility]) 40-5-25 MG TABS Take 1 tablet by mouth daily. 10/22/13   Hali Marry, MD  PROAIR HFA 108 [phone] BASE) MCG/ACT inhaler INHALE 2 PUFFS INTO THE LUNGS EVERY 6 (SIX) HOURS AS NEEDED. 06/28/12   Hali Marry, MD  SPIRIVA HANDIHALER 18 MCG inhalation capsule INHALE ONE CAPSULE DAILY 12/12/13   Hali Marry, MD   BP 116/74  Pulse 96  Temp(Src) 98 F (36.7 C) (Oral)  Ht 5\' 11"  (1.803 m)  Wt 225 lb (102.059 kg)  BMI 31.39 kg/m2  SpO2 97% Physical Exam  Nursing note and vitals reviewed. Constitutional: He is oriented to person, place, and time. He appears well-developed and well-nourished. No distress.  HENT:  Head: Normocephalic and atraumatic.  Eyes: Conjunctivae and EOM are normal. Pupils are equal, round, and reactive to light. No scleral icterus.  Neck: Normal range of motion.  Cardiovascular: Normal rate.   Pulmonary/Chest: Effort normal.  Abdominal: He exhibits no distension.  Musculoskeletal:       Left knee: He exhibits decreased range of motion, swelling and effusion. He exhibits  no ecchymosis, no laceration and no erythema. Tenderness (patella) found. Medial joint line and lateral joint line tenderness noted.  Left knee: Difficult to excess McMurray's sign because of pain with flexion and extension. No definite instability.  Neurological: He is alert and oriented to person, place, and time.  Skin: Skin is warm.  Psychiatric: He has a normal mood and affect.    ED Course  Procedures (including critical care time) Labs Review Labs Reviewed - No data to display  Imaging Review Dg Knee Complete 4  Views Left  02/09/2014   CLINICAL DATA:  Knee pain.  EXAM: LEFT KNEE - COMPLETE 4+ VIEW  COMPARISON:  None.  FINDINGS: Knee joint effusion is noted. Patellofemoral degenerative change present. No evidence of fracture or dislocation.  IMPRESSION: 1. Knee joint effusion. 2. Patellofemoral degenerative change. 3. No acute bony abnormality .   Electronically Signed   By: Marcello Moores  Register   On: 02/09/2014 10:22     MDM   1. Contusion, knee, left, initial encounter   2. Joint effusion, knee, left    acute severe contusion left knee, secondary joint effusion left knee. This is overlying chronic patellofemoral degenerative change. No acute bony abnormality or fracture seen on x-ray   Treatment options discussed, as well as risks, benefits, alternatives. Patient voiced understanding and agreement with the following plans:  ACE, immobilizer. Other advice given. Vicodin as needed for severe pain Mobic We arranged appointment with Dr. Dianah Field, sports medicine specialist, tomorrow for further definitive care, likely will need joint aspiration.  Precautions discussed. Red flags discussed. Questions invited and answered. Patient voiced understanding and agreement.     Jacqulyn Cane, MD 02/09/14 [phone]

## 2014-02-09 NOTE — ED Notes (Signed)
Patient reports running into coffee table several days ago and hitting left knee; it is now inflamed and edematous and the pain continues.

## 2014-02-10 ENCOUNTER — Ambulatory Visit (INDEPENDENT_AMBULATORY_CARE_PROVIDER_SITE_OTHER): Payer: 59 | Admitting: Sports Medicine

## 2014-02-10 ENCOUNTER — Encounter: Payer: Self-pay | Admitting: Sports Medicine

## 2014-02-10 VITALS — BP 117/82 | HR 107 | Ht 71.0 in | Wt 229.0 lb

## 2014-02-10 DIAGNOSIS — M1712 Unilateral primary osteoarthritis, left knee: Secondary | ICD-10-CM | POA: Insufficient documentation

## 2014-02-10 DIAGNOSIS — M25469 Effusion, unspecified knee: Secondary | ICD-10-CM

## 2014-02-10 DIAGNOSIS — M25462 Effusion, left knee: Secondary | ICD-10-CM

## 2014-02-10 MED ORDER — AMBULATORY NON FORMULARY MEDICATION: Status: DC

## 2014-02-10 NOTE — Progress Notes (Signed)
Patient ID: LAQUINN SHIPPY, male   DOB: [birth year], 58 y.o.   MRN: [id number]   Subjective:    I'm seeing this patient as a consultation for: Jacqulyn Cane, MD  CC: Acute left knee pain  HPI: Zlatan Hornback is a very pleasant 58 year old man who presents 7 days (9/15) after hitting his left knee on a coffee table with subsequent pain, inflammation and swelling. Pain is described as sharp and constant, worse with walking or bending the knee. He is able to bear weight on the leg, but this causes a significant increase in pain. He was seen in our Urgent Care yesterday (9/21), where x-ray revealed patellofemoral degenerative change with a knee joint effusion, but no evidence of acute fracture or dislocation. He was discharged from the UC with an ACE immobilizer, Mobic, and Vicodin PRN for severe pain. His pain has been much better controlled with these interventions, although swelling has prevented him from wearing the immobilizer. He does report remote injury to the knee while playing high school football, and also reports pain along the medial joint line with frequent popping prior to this injury. No known history of gout, but does report that his large toe has swollen on multiple occassions, with a recent episode of swelling.  Past medical history, Surgical history, Family history not pertinant except as noted below, Social history, Allergies, and medications have been entered into the medical record, reviewed, and no changes needed.   Review of Systems: No fevers, chills, weight loss or body aches.  Objective:   General: Well developed, well nourished, and in no acute distress.  Neuro/Psych: Alert and oriented x3, extra-ocular muscles intact, able to move all 4 extremities, sensation grossly intact. Skin: Warm and dry, no rashes noted.  Respiratory: Not using accessory muscles, speaking in full sentences, trachea midline.  Cardiovascular: Pulses palpable, no extremity edema. Abdomen: Does not  appear distended.  Left Knee: Inspection with no erythema or obvious bony abnormalities. Significant effusion visible. Palpation with no warmth, patellar tenderness, or condyle tenderness. Medial joint line tenderness present. ROM limited in flexion and extension due to pain and effusion.  Ligaments with solid consistent endpoints including ACL, PCL, LCL, MCL. Non-painful patellar compression. Patellar and quadriceps tendons unremarkable.  Procedure: Real-time Ultrasound Guided aspiration/Injection of left knee  Device: GE Logiq E  Verbal informed consent obtained.  Time-out conducted.  Noted no overlying erythema, induration, or other signs of local infection.  Skin prepped in a sterile fashion.  Local anesthesia: Topical Ethyl chloride.  With sterile technique and under real time ultrasound guidance:  95 cc of straw-colored cloudy fluid aspirated, syringe switched and 2 cc kenalog 40, 4 cc lidocaine injected easily, fluid sent off for culture and fluid analysis. Completed without difficulty  Pain immediately resolved suggesting accurate placement of the medication.  Advised to call if fevers/chills, erythema, induration, drainage, or persistent bleeding.  Images permanently stored and available for review in the ultrasound unit.  Impression: Technically successful ultrasound guided injection.  Impression and Recommendations:   This case required medical decision making of moderate complexity. Knee Pain: The differential diagnosis in this patient includes exacerbation of osteoarthritis following trauma and gout. X-ray findings of patellofemoral degenerative change and presence of knee pain prior to injury, paired with pain at the medial joint line on exam, would suggest that osteoarthritis is playing some role in his pain. Recurrent flares of first toe pain, however, paired with the straw-colored cloudy fluid aspirated in our office today warrant further investigation for gout. -  95 cc  aspirated in our office with subsequent ultrasound-guided injection - Continue Mobic and Vicodin PRN for pain - Prescription sent for cane - Fluid sent for analysis - Return in one week

## 2014-02-10 NOTE — Assessment & Plan Note (Signed)
95 cc aspirated, injected. Sent for fluid analysis. Return in 1 week. Suspect gout vs OA. Rx for cane.

## 2014-02-11 LAB — SYNOVIAL CELL COUNT + DIFF, W/ CRYSTALS
Crystals, Fluid: NONE SEEN
Eosinophils-Synovial: 0 % (ref 0–1)
Lymphocytes-Synovial Fld: 21 % — ABNORMAL HIGH (ref 0–20)
Monocyte/Macrophage: 0 % — ABNORMAL LOW (ref 50–90)
Neutrophil, Synovial: 79 % — ABNORMAL HIGH (ref 0–25)

## 2014-02-12 ENCOUNTER — Encounter: Payer: Self-pay | Admitting: Pulmonary Disease

## 2014-02-12 ENCOUNTER — Ambulatory Visit (INDEPENDENT_AMBULATORY_CARE_PROVIDER_SITE_OTHER): Payer: 59 | Admitting: Pulmonary Disease

## 2014-02-12 VITALS — BP 120/86 | HR 76 | Ht 71.0 in | Wt 230.0 lb

## 2014-02-12 DIAGNOSIS — J441 Chronic obstructive pulmonary disease with (acute) exacerbation: Secondary | ICD-10-CM

## 2014-02-12 DIAGNOSIS — F17200 Nicotine dependence, unspecified, uncomplicated: Secondary | ICD-10-CM

## 2014-02-12 DIAGNOSIS — I050 Rheumatic mitral stenosis: Secondary | ICD-10-CM

## 2014-02-12 NOTE — Assessment & Plan Note (Addendum)
Stay on [place] Start pulm rehab program We discussed early signs of chest cold Take the flu shot I again did explain to him that stage II COPD, by itself would not qualify him for long term disability.

## 2014-02-12 NOTE — Patient Instructions (Signed)
Stay on [place] Start pulm rehab program Echo test for narrowing of mitral valve We discussed early signs of chest cold Take the flu shot

## 2014-02-12 NOTE — Assessment & Plan Note (Signed)
Echo to follow up

## 2014-02-12 NOTE — Progress Notes (Signed)
   Subjective:    Patient ID: Edward Warren, male    DOB: [birth year], 58 y.o.   MRN: [id number]  HPI  58 year old smoker presents for FU of COPD.  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 He smoked about a pack per day since his teenage years-about 40 pack years-and more recently has been able to cut down to 5 cigarettes per day.  He also has appointments at the [place] at [facility], but wants to establish with me as his local pulmonologist.   Significant tests/ events  Spirometry in June 2014 showed an FEV1 of 2.57-68% with a ratio of 67, and FVC of 82% suggestive of moderate stage II COPD.  PFTs in 11/2009 showed a DLCO of 64% and an FEV1 of 63% suggestive of moderate impairment.  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 Charlett Blake. CT chest without contrast in 09/2012 and in 06/2011 did not show any pleural or interstitial abnormality.  He frankly admits that he is here for evaluation of disability.    Chantix -caused dreams and made him depressed .  Prednisone  caused him suicidal thoughts    Chest x-ray on 11/07/11 did not show any infiltrates or effusions   echo stress test in 2013 that showed mitral stenosis  02/12/2014  Chief Complaint  Patient presents with  . Follow-up    Pt c/o sob with exertion, states this has improved since he has began using nicotine patches and cutting back cigarettes to about 1/daily X2 weeks.      He recently had a COPD exacerbation and has been taken out of work from 7/20- 02/03/14. 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 This makes him more short of breath and makes him wheeze.  He is  maintained on a regimen of Spiriva and Breo.  He reports a morning cough productive of clear sputum Down to smoking 1-2 cigs/d Breathing better He is on short term disability & plans to go on long term in jan 2016   Review of Systems neg for any significant sore  throat, dysphagia, itching, sneezing, nasal congestion or excess/ purulent secretions, fever, chills, sweats, unintended wt loss, pleuritic or exertional cp, hempoptysis, orthopnea pnd or change in chronic leg swelling. Also denies presyncope, palpitations, heartburn, abdominal pain, nausea, vomiting, diarrhea or change in bowel or urinary habits, dysuria,hematuria, rash, arthralgias, visual complaints, headache, numbness weakness or ataxia.     Objective:   Physical Exam  Gen. Pleasant, well-nourished, in no distress ENT - no lesions, no post nasal drip Neck: No JVD, no thyromegaly, no carotid bruits Lungs: no use of accessory muscles, no dullness to percussion, decreased without rales or rhonchi  Cardiovascular: Rhythm regular, heart sounds  normal, no murmurs or gallops, no peripheral edema Musculoskeletal: No deformities, no cyanosis or clubbing        Assessment & Plan:

## 2014-02-12 NOTE — Assessment & Plan Note (Signed)
Ct nicotine patch x 6 wks

## 2014-02-14 LAB — BODY FLUID CULTURE
Gram Stain: NONE SEEN
Organism ID, Bacteria: NO GROWTH

## 2014-02-18 ENCOUNTER — Encounter: Payer: Self-pay | Admitting: Internal Medicine

## 2014-02-19 ENCOUNTER — Telehealth: Payer: Self-pay | Admitting: Pulmonary Disease

## 2014-02-19 NOTE — Telephone Encounter (Signed)
Pt called back and he is aware that they will contact him once they have him set up with pulmonary rehab.  He is aware to call back if they have not contacted him in the next couple of weeks.

## 2014-02-19 NOTE — Telephone Encounter (Signed)
[id number] x1 

## 2014-02-20 ENCOUNTER — Ambulatory Visit (INDEPENDENT_AMBULATORY_CARE_PROVIDER_SITE_OTHER): Payer: 59 | Admitting: Family Medicine

## 2014-02-20 ENCOUNTER — Encounter: Payer: Self-pay | Admitting: Family Medicine

## 2014-02-20 VITALS — BP 114/84 | HR 101 | Temp 98.0°F | Ht 71.0 in | Wt 228.0 lb

## 2014-02-20 DIAGNOSIS — F17200 Nicotine dependence, unspecified, uncomplicated: Secondary | ICD-10-CM

## 2014-02-20 DIAGNOSIS — J42 Unspecified chronic bronchitis: Secondary | ICD-10-CM

## 2014-02-20 DIAGNOSIS — Z23 Encounter for immunization: Secondary | ICD-10-CM

## 2014-02-20 DIAGNOSIS — Z720 Tobacco use: Secondary | ICD-10-CM

## 2014-02-20 DIAGNOSIS — J069 Acute upper respiratory infection, unspecified: Secondary | ICD-10-CM

## 2014-02-20 NOTE — Progress Notes (Signed)
   Subjective:    Patient ID: Edward Warren, male    DOB: 17-Feb-1956, 58 y.o.   MRN: [id number]  HPI Seeing pulmonology for COPD.  He has quit smoking for 2 weeks. He is using the nicotine patches. Says has noticed a big difference in his cough and sputum production. He is waiting on referral.   Think might be getting a cold. Has a runny nose and some mild congestion today.. No fevers chills or sweats. No productive cough at this point. No shortness of breath.   Review of Systems     Objective:   Physical Exam  Constitutional: He is oriented to person, place, and time. He appears well-developed and well-nourished.  HENT:  Head: Normocephalic and atraumatic.  Right Ear: External ear normal.  Left Ear: External ear normal.  Nose: Nose normal.  Mouth/Throat: Oropharynx is clear and moist.  TMs and canals are clear.   Eyes: Conjunctivae and EOM are normal. Pupils are equal, round, and reactive to light.  Neck: Neck supple. No thyromegaly present.  Cardiovascular: Normal rate and normal heart sounds.   Pulmonary/Chest: Effort normal and breath sounds normal.  Lymphadenopathy:    He has no cervical adenopathy.  Neurological: He is alert and oriented to person, place, and time.  Skin: Skin is warm and dry.  Psychiatric: He has a normal mood and affect.          Assessment & Plan:  COPD - Still on Breo and Spiriva. Continue current regimen. We'll check on the status of his pulmonary rehabilitation and also find out how long the typical rehabilitation course is. Rehab is 12 weeks.    Tob abuse - Now 2 weeks into Nicoderm CQ 21 mg patch.  He will go down to 40 mg patch about 2 weeks. Congratulated him on cessation so far.  Upper respiratory infection-likely viral. If he feels like it's starting to affect his breathing and cough and sputum prction and he needs to be seen in the office. Discussed this and patient understands. Also call if any fever.  Flu given.

## 2014-02-23 ENCOUNTER — Telehealth (HOSPITAL_COMMUNITY): Payer: Self-pay

## 2014-02-23 NOTE — Telephone Encounter (Signed)
I have called and left a message with Ajai to inquire about participation in Pulmonary Rehab. Will send letter in mail and follow up.

## 2014-02-24 ENCOUNTER — Telehealth (HOSPITAL_COMMUNITY): Payer: Self-pay

## 2014-02-24 NOTE — Telephone Encounter (Signed)
Called patient regarding entrance to Pulmonary Rehab.  Patient states that they are interested in attending the program.  Edward Warren is going to verify insurance coverage and follow up.

## 2014-02-27 ENCOUNTER — Other Ambulatory Visit: Payer: Self-pay | Admitting: *Deleted

## 2014-02-27 MED ORDER — FLUTICASONE FUROATE-VILANTEROL 100-25 MCG/INH IN AEPB: 1.0000 | INHALATION_SPRAY | Freq: Every day | RESPIRATORY_TRACT | Status: DC

## 2014-03-06 ENCOUNTER — Encounter (HOSPITAL_COMMUNITY): Payer: Self-pay

## 2014-03-06 ENCOUNTER — Encounter (HOSPITAL_COMMUNITY)
Admission: RE | Admit: 2014-03-06 | Discharge: 2014-03-06 | Disposition: A | Discharge status: Home / Self Care | Payer: 59 | Source: Ambulatory Visit | Attending: Pulmonary Disease | Admitting: Pulmonary Disease

## 2014-03-06 VITALS — BP 124/83 | HR 98 | Resp 20 | Ht 70.25 in | Wt 229.1 lb

## 2014-03-06 DIAGNOSIS — J439 Emphysema, unspecified: Secondary | ICD-10-CM | POA: Insufficient documentation

## 2014-03-06 DIAGNOSIS — Z5189 Encounter for other specified aftercare: Secondary | ICD-10-CM | POA: Diagnosis not present

## 2014-03-06 DIAGNOSIS — J438 Other emphysema: Secondary | ICD-10-CM

## 2014-03-06 HISTORY — DX: Pneumoconiosis due to asbestos and other mineral fibers: J61

## 2014-03-06 NOTE — Progress Notes (Signed)
Edward Warren 58 y.o. male Pulmonary Rehab Orientation Note Patient arrived today in [facility] for orientation to Pulmonary Rehab. He and his wife ambulated from [employer], accompanied by volunteer services. He does carry portable oxygen. Per pt, he uses oxygen never. Color good, skin warm and dry. Patient is oriented to time and place. Patient's medical history and medications reviewed. Heart rate is normal, breath sounds clear to auscultation, no rales, or rhonchi noted. Faint, scattered expiratory wheezes noted bilat. Grip strength equal, strong. Distal pulses palpable. Heart tones normal, no peripheral edema noted. Patient reports he does take medications as prescribed. Patient states he follows a Regular diet. Patient verbalized that he does have hypertension, but does not monitor his sodium intake and often eats cured pork products for breakfast. Low sodium, htn diet education completed. Teach back noted.The patient reports no specific efforts to gain or lose weight. Patient does state that since he has been trying to quit smoking, his caloric intake has increased as well as his weight and feels he eats out of boredom. Patient's weight will be monitored closely. Demonstration and practice of PLB using pulse oximeter. Patient able to return demonstration satisfactorily. Safety and hand hygiene in the exercise area reviewed with patient. Patient voices understanding of the information reviewed. Department expectations discussed with patient and achievable goals were set. One of the goals the patient has set for himself is to establish a hobby and a regular exercise routine. Patient feels he may be slightly depressed from not being able to work, and not having a hobby to replace the time he previously spent at work. He also scored a 3 on the PHQ2 and a 9 on the PHQ9. Counceling offered but declined and patient encouraged to speak to his primary care about his symptoms of depression.  Patient denied having thoughts of harming himself or others and admitted that some of his symptoms identified on the PHQ9 may be from the nature of his disease and not depression.  The patient shows enthusiasm about attending the program and we look forward to working with this nice gentleman. The patient is scheduled for a 6 min walk test on 10/22 and to begin exercise on 10/27 at 10:30.   45 minutes was spent on a variety of activities such as assessment of the patient, obtaining baseline data including height, weight, BMI, and grip strength, verifying medical history, allergies, and current medications, and teaching patient strategies for performing tasks with less respiratory effort with emphasis on pursed lip breathing.

## 2014-03-10 ENCOUNTER — Encounter: Payer: Self-pay | Admitting: Sports Medicine

## 2014-03-10 ENCOUNTER — Ambulatory Visit (INDEPENDENT_AMBULATORY_CARE_PROVIDER_SITE_OTHER): Payer: 59 | Admitting: Sports Medicine

## 2014-03-10 VITALS — BP 140/83 | HR 92 | Ht 71.0 in | Wt 234.0 lb

## 2014-03-10 DIAGNOSIS — M1712 Unilateral primary osteoarthritis, left knee: Secondary | ICD-10-CM

## 2014-03-10 NOTE — Assessment & Plan Note (Signed)
Doing well after aspiration of 95 cc an injection at the last visit. Occasional stiffness, I have advised that he use the Mobic at bedtime. Return as needed.

## 2014-03-10 NOTE — Progress Notes (Signed)
  Subjective:    CC: Followup  HPI: A month ago we aspirated 95 cc from this gentleman's left knee and injected, he is currently pain-free.  Past medical history, Surgical history, Family history not pertinant except as noted below, Social history, Allergies, and medications have been entered into the medical record, reviewed, and no changes needed.   Review of Systems: No fevers, chills, night sweats, weight loss, chest pain, or shortness of breath.   Objective:    General: Well Developed, well nourished, and in no acute distress.  Neuro: Alert and oriented x3, extra-ocular muscles intact, sensation grossly intact.  HEENT: Normocephalic, atraumatic, pupils equal round reactive to light, neck supple, no masses, no lymphadenopathy, thyroid nonpalpable.  Skin: Warm and dry, no rashes. Cardiac: Regular rate and rhythm, no murmurs rubs or gallops, no lower extremity edema.  Respiratory: Clear to auscultation bilaterally. Not using accessory muscles, speaking in full sentences. Left Knee: Normal to inspection with no erythema or effusion or obvious bony abnormalities. Palpation normal with no warmth or joint line tenderness or patellar tenderness or condyle tenderness. ROM normal in flexion and extension and lower leg rotation. Ligaments with solid consistent endpoints including ACL, PCL, LCL, MCL. Negative Mcmurray's and provocative meniscal tests. Non painful patellar compression. Patellar and quadriceps tendons unremarkable. Hamstring and quadriceps strength is normal.  Impression and Recommendations:

## 2014-03-12 ENCOUNTER — Ambulatory Visit (HOSPITAL_COMMUNITY): Payer: 59

## 2014-03-17 ENCOUNTER — Encounter (HOSPITAL_COMMUNITY)
Admission: RE | Admit: 2014-03-17 | Discharge: 2014-03-17 | Disposition: A | Discharge status: Home / Self Care | Payer: 59 | Source: Ambulatory Visit | Attending: Pulmonary Disease | Admitting: Pulmonary Disease

## 2014-03-17 ENCOUNTER — Encounter (HOSPITAL_COMMUNITY): Payer: 59

## 2014-03-17 DIAGNOSIS — Z5189 Encounter for other specified aftercare: Secondary | ICD-10-CM | POA: Diagnosis not present

## 2014-03-17 NOTE — Progress Notes (Signed)
Edward Warren completed a Six-Minute Walk Test on 03/17/14 . Edward Warren walked 1,451 feet with 0 breaks.  The patient's lowest oxygen saturation was 95% , highest heart rate was 112 bpm , and highest blood pressure was 152/88. The patient was on room air.  Edward Warren stated that nothing hindered his walk test.

## 2014-03-19 ENCOUNTER — Telehealth: Payer: Self-pay | Admitting: *Deleted

## 2014-03-19 ENCOUNTER — Encounter (HOSPITAL_COMMUNITY)
Admission: RE | Admit: 2014-03-19 | Discharge: 2014-03-19 | Disposition: A | Discharge status: Home / Self Care | Payer: 59 | Source: Ambulatory Visit | Attending: Pulmonary Disease | Admitting: Pulmonary Disease

## 2014-03-19 DIAGNOSIS — Z5189 Encounter for other specified aftercare: Secondary | ICD-10-CM | POA: Diagnosis not present

## 2014-03-19 NOTE — Progress Notes (Signed)
Today, Edward Warren exercised at [employer]. Service time was from 1030 to 1230.  The patient exercised for more than 31 minutes performing aerobic, strengthening, and stretching exercises. Oxygen saturation, heart rate, blood pressure, rate of perceived exertion, and shortness of breath were all monitored before, during, and after exercise. Edward Warren presented with a high diastolic blood pressure at today's exercise session. His initial diastolic check in blood pressure was 112 after ambulating 30 feet. After approx 10 min of sitting, his pressure decreased to 138/78. Patient was then allowed to exercise. During exercise his pressure increased to 162/102. Patient remained asymptomatic but exercise was discontinued. At discharge patients pressure was 148/84. Patient stated he took his medications this am. He also stated he had several cocktails last evening and had several cigarettes. Patient denied smoking while wearing his nicotine patch. Dr. Gardiner Ramus office notified and appointment made for tomorrow at [facility]. Patient notified. Patient also attended an education session on Advanced Directives.  There was no workload change during today's exercise session.  Pre-exercise vitals:   Weight kg:    Liters of O2: ra   SpO2: 97   HR: 99   BP: 138/78   CBG: na  Exercise vitals:   Highest heartrate:  105   Lowest oxygen saturation: 96   Highest blood pressure: 162/102   Liters of 02: ra  Post-exercise vitals:   SpO2: 98   HR: 96   BP: 148/84   Liters of O2: ra   CBG: na  Dr. Brand Males, Medical Director Dr. Aileen Fass is immediately available during today's Pulmonary Rehab session for Edward Warren on 03/19/2014 at 1030 class time.

## 2014-03-19 NOTE — Telephone Encounter (Signed)
Sonia from Pulmonary Rehab called and today was Edward Warren's initial consultation and when he was on the treadmill walking his pressure was 168/110. She said that his pressure is not controlled while exercising and when they were briefly walking in office she again checked it and his diastolic was 035. She did let him leave because he was asymptomatic but explained to him that he cannot return to rehab until this problem is addressed or resolved. Edward Warren was telling her that maybe he had too much to drink last night since he is drinking every night now unlike before. She said that he had a nicotine patch on and reaked of cigarette smoke also. He denied smoking prior to arrival with patch on. He is scheduled to see you tomorrow morning. Margette Fast, RMA

## 2014-03-20 ENCOUNTER — Ambulatory Visit (INDEPENDENT_AMBULATORY_CARE_PROVIDER_SITE_OTHER): Payer: 59 | Admitting: Family Medicine

## 2014-03-20 ENCOUNTER — Encounter: Payer: Self-pay | Admitting: Family Medicine

## 2014-03-20 ENCOUNTER — Other Ambulatory Visit: Payer: Self-pay | Admitting: *Deleted

## 2014-03-20 VITALS — BP 150/100 | HR 104 | Ht 71.0 in | Wt 236.0 lb

## 2014-03-20 DIAGNOSIS — I10 Essential (primary) hypertension: Secondary | ICD-10-CM

## 2014-03-20 DIAGNOSIS — Z720 Tobacco use: Secondary | ICD-10-CM

## 2014-03-20 DIAGNOSIS — F17200 Nicotine dependence, unspecified, uncomplicated: Secondary | ICD-10-CM

## 2014-03-20 MED ORDER — OLMESARTAN-AMLODIPINE-HCTZ 40-10-25 MG PO TABS: 1.0000 | ORAL_TABLET | Freq: Every day | ORAL | Status: DC

## 2014-03-20 NOTE — Progress Notes (Signed)
   Subjective:    Patient ID: Edward Warren, male    DOB: 01/16/1956, 58 y.o.   MRN: [id number]  Hypertension   Hypertension- Pt denies chest pain, SOB, dizziness, or heart palpitations.  Taking meds as directed w/o problems.  Denies medication side effects.  He is wearing a nicotine patch and tyring to quit smoking but feels like his appetite is out of control since then. Has been eating a lot.    Review of Systems     Objective:   Physical Exam  Constitutional: He is oriented to person, place, and time. He appears well-developed and well-nourished.  HENT:  Head: Normocephalic and atraumatic.  Cardiovascular: Normal rate, regular rhythm and normal heart sounds.   Pulmonary/Chest: Effort normal and breath sounds normal.  Neurological: He is alert and oriented to person, place, and time.  Skin: Skin is warm and dry.  Psychiatric: He has a normal mood and affect. His behavior is normal.          Assessment & Plan:  HTN - uncontrolled.  Will inc tribenzore to 40/10/25 mg daily. 4 weeks of samples given.  F/U in 1 months.  Work on losing weight. He has gained 6 lbs and has been getting into salty foods. Needs to get this under congrol and get BP down. If coming down then we can go back down on medicaiton. BP was awesome 4 weeks ago.

## 2014-03-20 NOTE — Telephone Encounter (Signed)
We increase his troponins were this morning, encouraged him to work on losing the 6 pounds that he has gained over the last month and really work on low-salt diet. We will see him back in 1 month. Please see office note visit.

## 2014-03-20 NOTE — Assessment & Plan Note (Signed)
Wearing nicotine patch. Says he has not smoked. Since wearing it.

## 2014-03-20 NOTE — Addendum Note (Signed)
Addended by: Teddy Spike on: 03/20/2014 11:29 AM   Modules accepted: Orders

## 2014-03-24 ENCOUNTER — Encounter (HOSPITAL_COMMUNITY): Payer: 59

## 2014-03-26 ENCOUNTER — Encounter (HOSPITAL_COMMUNITY)
Admission: RE | Admit: 2014-03-26 | Discharge: 2014-03-26 | Disposition: A | Discharge status: Home / Self Care | Payer: 59 | Source: Ambulatory Visit | Attending: Pulmonary Disease | Admitting: Pulmonary Disease

## 2014-03-26 DIAGNOSIS — J439 Emphysema, unspecified: Secondary | ICD-10-CM | POA: Diagnosis not present

## 2014-03-26 DIAGNOSIS — Z5189 Encounter for other specified aftercare: Secondary | ICD-10-CM | POA: Diagnosis not present

## 2014-03-26 NOTE — Progress Notes (Signed)
Today, Edward Warren exercised at [employer]. Service time was from 10:30am to 12:30pm.  The patient exercised for more than 31 minutes performing aerobic, strengthening, and stretching exercises. Oxygen saturation, heart rate, blood pressure, rate of perceived exertion, and shortness of breath were all monitored before, during, and after exercise. Edward Warren presented with no problems at today's exercise session. Patient attended education with Edward Warren on [employer].  There was no workload change during today's exercise session.  Pre-exercise vitals: . Weight kg: 104.4 . Liters of O2: ra . SpO2: 99 . HR: 111 . BP: 150/80 . CBG: na  Exercise vitals: . Highest heartrate:  118 . Lowest oxygen saturation: 96 . Highest blood pressure: 172/88 . Liters of 02: ra  Post-exercise vitals: . SpO2: 99 . HR: 112 . BP: 128/84 . Liters of O2: ra . CBG: na  Dr. Brand Males, Medical Director Dr. Wynelle Cleveland is immediately available during today's Pulmonary Rehab session for Edward Warren on 03/26/14 at 10:30am class time.

## 2014-03-31 ENCOUNTER — Encounter (HOSPITAL_COMMUNITY)
Admission: RE | Admit: 2014-03-31 | Discharge: 2014-03-31 | Disposition: A | Discharge status: Home / Self Care | Payer: 59 | Source: Ambulatory Visit | Attending: Pulmonary Disease | Admitting: Pulmonary Disease

## 2014-03-31 DIAGNOSIS — Z5189 Encounter for other specified aftercare: Secondary | ICD-10-CM | POA: Diagnosis not present

## 2014-03-31 NOTE — Progress Notes (Signed)
Today, Edward Warren exercised at [employer]. Service time was from 10:30am to 12:15pm.  The patient exercised for more than 31 minutes performing aerobic, strengthening, and stretching exercises. Oxygen saturation, heart rate, blood pressure, rate of perceived exertion, and shortness of breath were all monitored before, during, and after exercise. [name] presented with no problems at today's exercise session.   There was an increase in workload change during today's exercise session.  Pre-exercise vitals: . Weight kg: 105.9 . Liters of O2: ra . SpO2: 99 . HR: 105 . BP: 128/76 . CBG: na  Exercise vitals: . Highest heartrate:  109 . Lowest oxygen saturation: 94 . Highest blood pressure: 120/70 . Liters of 02: ra  Post-exercise vitals: . SpO2: 98 . HR: 86 . BP: 114/72 . Liters of O2: ra . CBG: na  Dr. Brand Males, Medical Director Dr. Wynelle Cleveland is immediately available during today's Pulmonary Rehab session for Edward Warren on 03/31/14 at 10:30am class time.

## 2014-04-01 ENCOUNTER — Ambulatory Visit: Payer: 59 | Admitting: Family Medicine

## 2014-04-02 ENCOUNTER — Encounter (HOSPITAL_COMMUNITY): Payer: 59

## 2014-04-02 ENCOUNTER — Telehealth (HOSPITAL_COMMUNITY): Payer: Self-pay | Admitting: Family Medicine

## 2014-04-04 ENCOUNTER — Other Ambulatory Visit: Payer: Self-pay | Admitting: Family Medicine

## 2014-04-07 ENCOUNTER — Encounter (HOSPITAL_COMMUNITY)
Admission: RE | Admit: 2014-04-07 | Discharge: 2014-04-07 | Disposition: A | Discharge status: Home / Self Care | Payer: 59 | Source: Ambulatory Visit | Attending: Pulmonary Disease | Admitting: Pulmonary Disease

## 2014-04-07 DIAGNOSIS — Z5189 Encounter for other specified aftercare: Secondary | ICD-10-CM | POA: Diagnosis not present

## 2014-04-07 NOTE — Progress Notes (Addendum)
Today, [name] exercised at [employer]. Service time was from 10:30am to 12:00pm.  The patient exercised for more than 31 minutes performing aerobic, strengthening, and stretching exercises. Oxygen saturation, heart rate, blood pressure, rate of perceived exertion, and shortness of breath were all monitored before, during, and after exercise. Melvyn presented with no problems at today's exercise session.   There was an increase in workload change during today's exercise session.  Pre-exercise vitals: . Weight kg: 106.1 . Liters of O2: ra . SpO2: 98 . HR: 94 . BP: 96/94 . CBG: na  Exercise vitals: . Highest heartrate:  109 . Lowest oxygen saturation: 97 . Highest blood pressure: 132/78 . Liters of 02: ra  Post-exercise vitals: . SpO2: 98 . HR: 92 . BP: 118/86 . Liters of O2: ra . CBG: na  Dr. Brand Males, Medical Director Dr. Wyline Copas is immediately available during today's Pulmonary Rehab session for Srijan Givan Brassell on 04/07/14 at 10:30am class time.

## 2014-04-08 ENCOUNTER — Other Ambulatory Visit: Payer: Self-pay | Admitting: Family Medicine

## 2014-04-09 ENCOUNTER — Encounter (HOSPITAL_COMMUNITY): Payer: 59

## 2014-04-13 ENCOUNTER — Telehealth: Payer: Self-pay | Admitting: Pulmonary Disease

## 2014-04-13 NOTE — Telephone Encounter (Signed)
Called pt. appt scheduled in December. Nothing further needed

## 2014-04-14 ENCOUNTER — Ambulatory Visit (INDEPENDENT_AMBULATORY_CARE_PROVIDER_SITE_OTHER): Payer: 59 | Admitting: Sports Medicine

## 2014-04-14 ENCOUNTER — Encounter (HOSPITAL_COMMUNITY): Payer: 59

## 2014-04-14 ENCOUNTER — Encounter: Payer: Self-pay | Admitting: Sports Medicine

## 2014-04-14 VITALS — BP 148/88 | HR 114 | Ht 71.0 in | Wt 235.0 lb

## 2014-04-14 DIAGNOSIS — M1712 Unilateral primary osteoarthritis, left knee: Secondary | ICD-10-CM

## 2014-04-14 NOTE — Assessment & Plan Note (Addendum)
Excellent response but insufficient duration to a most recent aspiration and injection. Repeat aspiration with injection of Depo-Medrol today. Fluid was cloudy so we will send off for cell counts and fluid analysis. We are going to get him set up for Visco supplementation.

## 2014-04-14 NOTE — Progress Notes (Signed)
  Subjective:    CC: follow-up  HPI: Left knee osteoarthritis: Persistent swelling despite drainage of 95 mL at the last visit. He had a two-month response to the injection. Pain is moderate, persistent, localized at the joint lines and the suprapatellar recess.  Past medical history, Surgical history, Family history not pertinant except as noted below, Social history, Allergies, and medications have been entered into the medical record, reviewed, and no changes needed.   Review of Systems: No fevers, chills, night sweats, weight loss, chest pain, or shortness of breath.   Objective:    General: Well Developed, well nourished, and in no acute distress.  Neuro: Alert and oriented x3, extra-ocular muscles intact, sensation grossly intact.  HEENT: Normocephalic, atraumatic, pupils equal round reactive to light, neck supple, no masses, no lymphadenopathy, thyroid nonpalpable.  Skin: Warm and dry, no rashes. Cardiac: Regular rate and rhythm, no murmurs rubs or gallops, no lower extremity edema.  Respiratory: Clear to auscultation bilaterally. Not using accessory muscles, speaking in full sentences.  Procedure: Real-time Ultrasound Guided aspiration/Injection of left knee Device: GE Logiq E  Verbal informed consent obtained.  Time-out conducted.  Noted no overlying erythema, induration, or other signs of local infection.  Skin prepped in a sterile fashion.  Local anesthesia: Topical Ethyl chloride.  With sterile technique and under real time ultrasound guidance:  Aspirated 35 mL of cloudy, straw-colored fluid, syringe switched and 1 mL Depo-Medrol 40, 4 mL lidocaine injected easily. Completed without difficulty  Pain immediately resolved suggesting accurate placement of the medication.  Advised to call if fevers/chills, erythema, induration, drainage, or persistent bleeding.  Images permanently stored and available for review in the ultrasound unit.  Impression: Technically successful  ultrasound guided injection.  Impression and Recommendations:

## 2014-04-15 ENCOUNTER — Other Ambulatory Visit: Payer: Self-pay | Admitting: *Deleted

## 2014-04-15 MED ORDER — HYALURONAN 30 MG/2ML IX SOSY: 2.0000 mL | PREFILLED_SYRINGE | Freq: Two times a day (BID) | INTRA_ARTICULAR | Status: DC

## 2014-04-16 ENCOUNTER — Encounter (HOSPITAL_COMMUNITY): Payer: 59

## 2014-04-16 LAB — SYNOVIAL CELL COUNT + DIFF, W/ CRYSTALS
Crystals, Fluid: NONE SEEN
Eosinophils-Synovial: 0 % (ref 0–1)
Lymphocytes-Synovial Fld: 3 % (ref 0–20)
Monocyte/Macrophage: 19 % — ABNORMAL LOW (ref 50–90)
Neutrophil, Synovial: 78 % — ABNORMAL HIGH (ref 0–25)
WBC, Synovial: 25400 cu mm — ABNORMAL HIGH (ref 0–200)

## 2014-04-19 LAB — BODY FLUID CULTURE
Gram Stain: NONE SEEN
Organism ID, Bacteria: NO GROWTH

## 2014-04-21 ENCOUNTER — Encounter (HOSPITAL_COMMUNITY)
Admission: RE | Admit: 2014-04-21 | Discharge: 2014-04-21 | Disposition: A | Discharge status: Home / Self Care | Payer: Worker's Compensation | Source: Ambulatory Visit | Attending: Pulmonary Disease | Admitting: Pulmonary Disease

## 2014-04-21 DIAGNOSIS — J439 Emphysema, unspecified: Secondary | ICD-10-CM | POA: Insufficient documentation

## 2014-04-21 DIAGNOSIS — Z5189 Encounter for other specified aftercare: Secondary | ICD-10-CM | POA: Diagnosis present

## 2014-04-21 NOTE — Progress Notes (Signed)
Today, Edward Warren exercised at [employer]. Service time was from 10:30am to 12:15pm.  The patient exercised for more than 31 minutes performing aerobic, strengthening, and stretching exercises. Oxygen saturation, heart rate, blood pressure, rate of perceived exertion, and shortness of breath were all monitored before, during, and after exercise. Edward Warren presented with no problems at today's exercise session.   There was an increase in workload change during today's exercise session.  Pre-exercise vitals: . Weight kg: 105.2 . Liters of O2: ra . SpO2: 98 . HR: 104 . BP: 132/74 . CBG: na  Exercise vitals: . Highest heartrate:  124 . Lowest oxygen saturation: 95 . Highest blood pressure: 138/64 . Liters of 02: ra  Post-exercise vitals: . SpO2: 97 . HR: 109 . BP: 118/78 . Liters of O2: ra . CBG: na  Dr. Brand Males, Medical Director Dr. Waldron Labs is immediately available during today's Pulmonary Rehab session for Edward Warren on 04/21/14 at 10:30am class time.

## 2014-04-23 ENCOUNTER — Encounter (HOSPITAL_COMMUNITY)
Admission: RE | Admit: 2014-04-23 | Discharge: 2014-04-23 | Disposition: A | Discharge status: Home / Self Care | Payer: Worker's Compensation | Source: Ambulatory Visit | Attending: Pulmonary Disease | Admitting: Pulmonary Disease

## 2014-04-23 DIAGNOSIS — Z5189 Encounter for other specified aftercare: Secondary | ICD-10-CM | POA: Diagnosis not present

## 2014-04-23 NOTE — Progress Notes (Signed)
Today, Edward Warren exercised at [employer]. Service time was from 1030 to 1225.  The patient exercised for more than 31 minutes performing aerobic, strengthening, and stretching exercises. Oxygen saturation, heart rate, blood pressure, rate of perceived exertion, and shortness of breath were all monitored before, during, and after exercise. Camdin presented with no problems at today's exercise session.   There was a workload change during today's exercise session.  Pre-exercise vitals: . Weight kg: 105.0 . Liters of O2: ra . SpO2: 99 . HR: 102 . BP: 100/70 . CBG: na  Exercise vitals: . Highest heartrate:  116 . Lowest oxygen saturation: 99 . Highest blood pressure: 162/88 . Liters of 02: ra  Post-exercise vitals: . SpO2: 97 . HR: 98 . BP: 116/64 . Liters of O2: ra . CBG: na  Dr. Brand Males, Medical Director Dr. Tana Coast is immediately available during today's Pulmonary Rehab session for Edward Warren on 04/23/2014 at 1030 class time.  [name] Kitchen

## 2014-04-23 NOTE — Progress Notes (Signed)
Edward Warren 58 y.o. male Nutrition Note Spoke with pt. Pt is overweight. Per pt, he has gained 10 lb since he stopped smoking. Pt wants to lose wt. Low calorie snack ideas discussed. There are some ways the pt can make his eating habits healthier (e.g. decreasing poultry skin, regular cheese, and regular salad dressing consumption).  Ways to work toward healthy eating habits discussed. Pt's Rate Your Plate results reviewed with pt. Pt did not avoid salty food when he started Pulmonary rehab and is currently working toward decreasing sodium intake by choosing fresh/frozen vegetables more often and decreasing use of salt and seasonings with salt when cooking.  The role of sodium in lung disease reviewed with pt. Pt expressed understanding of the information reviewed.  Nutrition Diagnosis ? Food-and nutrition-related knowledge deficit related to lack of exposure to information as related to diagnosis of pulmonary disease ? Overweight related to excessive energy intake as evidenced by a BMI of 29.2 ?  Nutrition Intervention ? Pt's individual nutrition plan and goals reviewed with pt. ? Benefits of adopting healthy eating habits discussed when pt's Rate Your Plate reviewed. ? Handouts for: Healthy snacks for people quitting tobacco use ? Pt to attend the Nutrition and Lung Disease class ? Continual client-centered nutrition education by RD, as part of interdisciplinary care. Goal(s) 1. Pt to identify and limit food sources of sodium. 2. Identify food quantities necessary to achieve wt loss of  -2# per week to a goal wt of 93-101.2 kg (204-222 lb) at graduation from pulmonary rehab. 3. Describe the benefit of including fruits, vegetables, whole grains, and low-fat dairy products in a healthy meal plan. Monitor and Evaluate progress toward nutrition goal with team.   Derek Mound, M.Ed, RD, LDN, CDE 04/23/2014 12:32 PM

## 2014-04-24 ENCOUNTER — Ambulatory Visit: Payer: 59 | Admitting: Family Medicine

## 2014-04-28 ENCOUNTER — Encounter: Payer: Self-pay | Admitting: Pulmonary Disease

## 2014-04-28 ENCOUNTER — Encounter (HOSPITAL_COMMUNITY)
Admission: RE | Admit: 2014-04-28 | Discharge: 2014-04-28 | Disposition: A | Discharge status: Home / Self Care | Payer: Worker's Compensation | Source: Ambulatory Visit | Attending: Pulmonary Disease | Admitting: Pulmonary Disease

## 2014-04-28 ENCOUNTER — Ambulatory Visit (INDEPENDENT_AMBULATORY_CARE_PROVIDER_SITE_OTHER): Payer: 59 | Admitting: Pulmonary Disease

## 2014-04-28 VITALS — BP 146/86 | HR 90 | Ht 71.0 in | Wt 235.8 lb

## 2014-04-28 DIAGNOSIS — Z5189 Encounter for other specified aftercare: Secondary | ICD-10-CM | POA: Diagnosis not present

## 2014-04-28 DIAGNOSIS — J410 Simple chronic bronchitis: Secondary | ICD-10-CM

## 2014-04-28 MED ORDER — NICOTINE 10 MG IN INHA: 1.0000 | RESPIRATORY_TRACT | Status: DC | PRN

## 2014-04-28 NOTE — Progress Notes (Signed)
I have reviewed a Home Exercise Prescription with Edward Warren . Edward Warren is currently exercising at home.  The patient was advised to walk 2-3 days a week for 30 minutes.  Edward Warren and I discussed how to progress their exercise prescription.  The patient stated that their goals were to be able to breathe better and completely stop smoking.  The patient stated that they understand the exercise prescription.  We reviewed exercise guidelines, target heart rate during exercise, oxygen use, weather, home pulse oximeter, endpoints for exercise, and goals.  Patient is encouraged to come to me with any questions. I will continue to follow up with the patient to assist them with progression and safety.

## 2014-04-28 NOTE — Progress Notes (Signed)
Today, Edward Warren exercised at [facility]. Service time was from 10:30am to 12:00pm.  The patient exercised for more than 31 minutes performing aerobic, strengthening, and stretching exercises. Oxygen saturation, heart rate, blood pressure, rate of perceived exertion, and shortness of breath were all monitored before, during, and after exercise. Edward Warren presented with no problems at today's exercise session.   There was a increase in workload change during today's exercise session.  Pre-exercise vitals: . Weight kg: 104.7 . Liters of O2: ra . SpO2: 98 . HR: 97 . BP: 98/62 . CBG: na  Exercise vitals: . Highest heartrate:  121 . Lowest oxygen saturation: 95 . Highest blood pressure: 152/88 . Liters of 02: ra  Post-exercise vitals: . SpO2: 97 . HR: 107 . BP: 124/64 . Liters of O2: ra . CBG: na  Dr. Brand Males, Medical Director Dr. Tana Coast is immediately available during today's Pulmonary Rehab session for Edward Warren on 04/28/14 at 10:30am class time.

## 2014-04-28 NOTE — Patient Instructions (Signed)
You are benefiting from pulmonary rehab Trial of nicotrol inhaler #5 use maximum of one per day Use mucinex 600 mg twice daily x 7 days then as needed

## 2014-04-28 NOTE — Progress Notes (Signed)
   Subjective:    Patient ID: Edward Warren, male    DOB: 11/14/1955, 58 y.o.   MRN: [id number]  HPI  58 year old smoker presents for FU of COPD- gold B.  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 He smoked about a pack per day since his teenage years-about 40 pack years He also goes to the [place] [facility] [employer], but wants to establish with me as his local pulmonologist.  He  had a COPD exacerbation and has been taken out of work from 7/20- 02/03/14. 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 This makes him more short of breath and makes him wheeze.   Significant tests/ events PFTs - 12/2013 -FEV1 69%, ratio 63, DLCO 40 Spirometry in June 2014 showed an FEV1 of 2.57-68% with a ratio of 67, and FVC of 82% suggestive of moderate stage II COPD.  PFTs in 11/2009 showed a DLCO of 64% and an FEV1 of 63% suggestive of moderate impairment.  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 Charlett Blake. CT chest without contrast in 09/2012 and in 06/2011 did not show any pleural or interstitial abnormality.   Meds- Chantix -caused dreams and made him depressed .  Prednisone caused him suicidal thoughts   Chest x-ray on 11/07/11 did not show any infiltrates or effusions   echo stress test in 2013 that showed mitral stenosis  04/28/2014  Chief Complaint  Patient presents with  . Follow-up    in rehab program, twice a week, helping wih breathing; still has a lot of phelgm still trying to get out wants to know if there is a prescription that will help get the phlegm out?    He is enrolled in pulmonary rehabilitation-and really enjoying the program  He is maintained on a regimen of Spiriva and Breo.  Down to smoking 1-2 cigs/d, he seems to be motivated to quit, is using nicotine patch and trying other behavioral techniques Breathing better He is on short term disability & plans to go on long term in jan  2016 He reports increased sputum production and would like something for this He is getting knee injections for osteoarthritis   Review of Systems neg for any significant sore throat, dysphagia, itching, sneezing, nasal congestion or excess/ purulent secretions, fever, chills, sweats, unintended wt loss, pleuritic or exertional cp, hempoptysis, orthopnea pnd or change in chronic leg swelling. Also denies presyncope, palpitations, heartburn, abdominal pain, nausea, vomiting, diarrhea or change in bowel or urinary habits, dysuria,hematuria, rash, arthralgias, visual complaints, headache, numbness weakness or ataxia.     Objective:   Physical Exam  Gen. Pleasant, well-nourished, in no distress ENT - no lesions, no post nasal drip Neck: No JVD, no thyromegaly, no carotid bruits Lungs: no use of accessory muscles, no dullness to percussion, decreased without rales or rhonchi  Cardiovascular: Rhythm regular, heart sounds  normal, no murmurs or gallops, no peripheral edema Musculoskeletal: No deformities, no cyanosis or clubbing        Assessment & Plan:

## 2014-04-28 NOTE — Assessment & Plan Note (Signed)
You are benefiting from pulmonary rehab Trial of nicotrol inhaler #5 use maximum of one per day Use mucinex 600 mg twice daily x 7 days then as needed  Overall I think he is doing very well with pulmonary rehabilitation and seems to be motivated to make a quit attempt at around new year. I provided him with more encouragement for this

## 2014-04-30 ENCOUNTER — Encounter (HOSPITAL_COMMUNITY)
Admission: RE | Admit: 2014-04-30 | Discharge: 2014-04-30 | Disposition: A | Discharge status: Home / Self Care | Payer: Worker's Compensation | Source: Ambulatory Visit | Attending: Pulmonary Disease | Admitting: Pulmonary Disease

## 2014-04-30 DIAGNOSIS — Z5189 Encounter for other specified aftercare: Secondary | ICD-10-CM | POA: Diagnosis not present

## 2014-04-30 NOTE — Progress Notes (Signed)
Today, Edward Warren exercised at [facility]. Service time was from 10:30am to 12:20pm.  The patient exercised for more than 31 minutes performing aerobic, strengthening, and stretching exercises. Oxygen saturation, heart rate, blood pressure, rate of perceived exertion, and shortness of breath were all monitored before, during, and after exercise. Lacorey presented with no problems at today's exercise session. The patient attended education with Jeanella Craze on Advanced Directives.  There was an increase in workload change during today's exercise session.  Pre-exercise vitals: . Weight kg: 104.9 . Liters of O2: ra . SpO2: 98 . HR: 94 . BP: 126/60 . CBG: na  Exercise vitals: . Highest heartrate:  116 . Lowest oxygen saturation: 98 . Highest blood pressure: 142/74 . Liters of 02: ra  Post-exercise vitals: . SpO2: 98 . HR: 97 . BP: 98/60 . Liters of O2: ra . CBG: na  Dr. Brand Males, Medical Director Dr. Coralyn Pear is immediately available during today's Pulmonary Rehab session for Bronislaw Switzer Kaminsky on 04/30/14 at 10:30am class time.

## 2014-05-05 ENCOUNTER — Ambulatory Visit: Payer: 59 | Admitting: Family Medicine

## 2014-05-05 ENCOUNTER — Encounter (HOSPITAL_COMMUNITY): Payer: Worker's Compensation

## 2014-05-05 ENCOUNTER — Telehealth (HOSPITAL_COMMUNITY): Payer: Self-pay | Admitting: Family Medicine

## 2014-05-07 ENCOUNTER — Encounter (HOSPITAL_COMMUNITY): Payer: Worker's Compensation

## 2014-05-12 ENCOUNTER — Encounter (HOSPITAL_COMMUNITY): Payer: Worker's Compensation

## 2014-05-12 ENCOUNTER — Telehealth (HOSPITAL_COMMUNITY): Payer: Self-pay | Admitting: Family Medicine

## 2014-05-14 ENCOUNTER — Encounter (HOSPITAL_COMMUNITY): Payer: Worker's Compensation

## 2014-05-18 ENCOUNTER — Encounter: Payer: Self-pay | Admitting: Family Medicine

## 2014-05-18 ENCOUNTER — Ambulatory Visit (INDEPENDENT_AMBULATORY_CARE_PROVIDER_SITE_OTHER): Payer: 59 | Admitting: Family Medicine

## 2014-05-18 VITALS — BP 122/82 | HR 86 | Ht 71.0 in | Wt 239.0 lb

## 2014-05-18 DIAGNOSIS — I10 Essential (primary) hypertension: Secondary | ICD-10-CM

## 2014-05-18 NOTE — Progress Notes (Signed)
   Subjective:    Patient ID: Edward Warren, male    DOB: 06-Dec-1955, 58 y.o.   MRN: [id number]  HPI Hypertension- Pt denies chest pain, SOB, dizziness, or heart palpitations.  Taking meds as directed w/o problems.  Denies medication side effects.  Doing very well on increased dose of the Tribenzor. He denies any side effects. Review of Systems     Objective:   Physical Exam  Constitutional: He is oriented to person, place, and time. He appears well-developed and well-nourished.  HENT:  Head: Normocephalic and atraumatic.  Cardiovascular: Normal rate, regular rhythm and normal heart sounds.   Pulmonary/Chest: Effort normal and breath sounds normal.  Neurological: He is alert and oriented to person, place, and time.  Skin: Skin is warm and dry.  Psychiatric: He has a normal mood and affect. His behavior is normal.          Assessment & Plan:  Hypertension-at goal. Looks absolutely fantastic and is tolerating it well. Due for BMP just to check electrolytes and potassium level. Follow-up in 4 months.

## 2014-05-19 ENCOUNTER — Other Ambulatory Visit: Payer: Self-pay | Admitting: Family Medicine

## 2014-05-19 ENCOUNTER — Encounter (HOSPITAL_COMMUNITY)
Admission: RE | Admit: 2014-05-19 | Discharge: 2014-05-19 | Disposition: A | Discharge status: Home / Self Care | Payer: Worker's Compensation | Source: Ambulatory Visit | Attending: Pulmonary Disease | Admitting: Pulmonary Disease

## 2014-05-19 DIAGNOSIS — Z5189 Encounter for other specified aftercare: Secondary | ICD-10-CM | POA: Diagnosis not present

## 2014-05-19 LAB — BASIC METABOLIC PANEL WITH GFR
BUN: 22 mg/dL (ref 6–23)
CHLORIDE: 104 meq/L (ref 96–112)
CO2: 22 mEq/L (ref 19–32)
Calcium: 9.8 mg/dL (ref 8.4–10.5)
Creat: 1.13 mg/dL (ref 0.50–1.35)
GFR, Est African American: 81 mL/min
GFR, Est Non African American: 70 mL/min
GLUCOSE: 95 mg/dL (ref 70–99)
POTASSIUM: 4.3 meq/L (ref 3.5–5.3)
SODIUM: 139 meq/L (ref 135–145)

## 2014-05-19 NOTE — Progress Notes (Signed)
Quick Note:  All labs are normal. ______ 

## 2014-05-19 NOTE — Progress Notes (Signed)
Today, [name] exercised at [facility]. Service time was from 1030 to 1150.  The patient exercised for more than 31 minutes performing aerobic, strengthening, and stretching exercises. Oxygen saturation, heart rate, blood pressure, rate of perceived exertion, and shortness of breath were all monitored before, during, and after exercise. [name] presented with no problems at today's exercise session.   There was a workload change during today's exercise session.  Pre-exercise vitals: . Weight kg: 106.7 . Liters of O2: ra . SpO2: 100 . HR: 87 . BP: 122/70 . CBG: na  Exercise vitals: . Highest heartrate:  131 down to 122 . Lowest oxygen saturation: 95 . Highest blood pressure: 132/70 . Liters of 02: ra  Post-exercise vitals: . SpO2: 99 . HR: 92 . BP: 102/60 . Liters of O2: ra . CBG: na  Dr. Brand Males, Medical Director Dr. Candiss Norse is immediately available during today's Pulmonary Rehab session for Edward Warren on 05/19/2014 at 1030 class time.

## 2014-05-20 ENCOUNTER — Ambulatory Visit: Payer: Self-pay | Admitting: Sports Medicine

## 2014-05-21 ENCOUNTER — Encounter (HOSPITAL_COMMUNITY)
Admission: RE | Admit: 2014-05-21 | Discharge: 2014-05-21 | Disposition: A | Discharge status: Home / Self Care | Payer: Worker's Compensation | Source: Ambulatory Visit | Attending: Pulmonary Disease | Admitting: Pulmonary Disease

## 2014-05-21 DIAGNOSIS — Z5189 Encounter for other specified aftercare: Secondary | ICD-10-CM | POA: Diagnosis not present

## 2014-05-21 NOTE — Progress Notes (Signed)
Today, [name] exercised at [facility]. Service time was from 1030 to 1230.  The patient exercised for more than 31 minutes performing aerobic, strengthening, and stretching exercises. Oxygen saturation, heart rate, blood pressure, rate of perceived exertion, and shortness of breath were all monitored before, during, and after exercise. Rankin presented with no problems at today's exercise session.   There was one workload change during today's exercise session.  Pre-exercise vitals: . Weight kg: 104.5 . Liters of O2: ra . SpO2: 98 . HR: 95 . BP: 118/60 . CBG: na  Exercise vitals: . Highest heartrate:  132 . Lowest oxygen saturation: 98 . Highest blood pressure: 110/70 . Liters of 02: ra  Post-exercise vitals: . SpO2: 97 . HR: 98 . BP: 104/68 . Liters of O2: ra . CBG: na Dr. Brand Males, Medical Director Dr. Candiss Norse is immediately available during today's Pulmonary Rehab session for Khyle Goodell [place]ano on 05/21/2014 at 1030 class time. Patient attended the Warning Signs and Symptoms class today. [name] Kitchen

## 2014-05-24 ENCOUNTER — Other Ambulatory Visit: Payer: Self-pay | Admitting: Family Medicine

## 2014-05-26 ENCOUNTER — Encounter (HOSPITAL_COMMUNITY)
Admission: RE | Admit: 2014-05-26 | Discharge: 2014-05-26 | Disposition: A | Discharge status: Home / Self Care | Payer: 59 | Source: Ambulatory Visit | Attending: Pulmonary Disease | Admitting: Pulmonary Disease

## 2014-05-26 DIAGNOSIS — Z5189 Encounter for other specified aftercare: Secondary | ICD-10-CM | POA: Insufficient documentation

## 2014-05-26 DIAGNOSIS — J439 Emphysema, unspecified: Secondary | ICD-10-CM | POA: Insufficient documentation

## 2014-05-26 NOTE — Progress Notes (Signed)
Today, Edward Warren exercised at [facility]. Service time was from 10:30am to 12:05pm.  The patient exercised for more than 31 minutes performing aerobic, strengthening, and stretching exercises. Oxygen saturation, heart rate, blood pressure, rate of perceived exertion, and shortness of breath were all monitored before, during, and after exercise. [name] presented with no problems at today's exercise session.   There was no workload change during today's exercise session.  Pre-exercise vitals: . Weight kg: 107.4 . Liters of O2: ra . SpO2: 99 . HR: 88 . BP: 135/91 . CBG: na  Exercise vitals: . Highest heartrate:  108 . Lowest oxygen saturation: 98 . Highest blood pressure: 158/88 . Liters of 02: ra  Post-exercise vitals: . SpO2: 98 . HR: 90 . BP: 116/88 . Liters of O2: ra . CBG: na  Dr. Brand Males, Medical Director Dr. Coralyn Pear is immediately available during today's Pulmonary Rehab session for Edward Warren on 05/26/14 at 10:30am class time.

## 2014-05-28 ENCOUNTER — Telehealth (HOSPITAL_COMMUNITY): Payer: Self-pay | Admitting: Family Medicine

## 2014-05-28 ENCOUNTER — Ambulatory Visit: Payer: 59 | Admitting: Pulmonary Disease

## 2014-05-28 ENCOUNTER — Encounter (HOSPITAL_COMMUNITY): Payer: 59

## 2014-06-02 ENCOUNTER — Encounter (HOSPITAL_COMMUNITY)
Admission: RE | Admit: 2014-06-02 | Discharge: 2014-06-02 | Disposition: A | Discharge status: Home / Self Care | Payer: 59 | Source: Ambulatory Visit | Attending: Pulmonary Disease | Admitting: Pulmonary Disease

## 2014-06-02 NOTE — Progress Notes (Signed)
Today, [name] exercised at [facility]. Service time was from 1030 to 1155.  The patient exercised for more than 31 minutes performing aerobic, strengthening, and stretching exercises. Oxygen saturation, heart rate, blood pressure, rate of perceived exertion, and shortness of breath were all monitored before, during, and after exercise. Edward Warren presented with no problems at today's exercise session.   There was no workload change during today's exercise session.  Pre-exercise vitals: . Weight kg: 105.7 . Liters of O2: ra . SpO2: 99 . HR: 104 . BP: 120/70 . CBG: na  Exercise vitals: . Highest heartrate:  136 . Lowest oxygen saturation: 97 . Highest blood pressure: 122/86 . Liters of 02: ra  Post-exercise vitals: . SpO2: 98 . HR: 102 . BP: 104/62 . Liters of O2: ra . CBG: na Dr. Brand Males, Medical Director Dr. Coralyn Pear is immediately available during today's Pulmonary Rehab session for Thorsten Climer Aten on 06/02/2014 at 1030 class time.  [name] Kitchen

## 2014-06-04 ENCOUNTER — Encounter (HOSPITAL_COMMUNITY)
Admission: RE | Admit: 2014-06-04 | Discharge: 2014-06-04 | Disposition: A | Discharge status: Home / Self Care | Payer: 59 | Source: Ambulatory Visit | Attending: Pulmonary Disease | Admitting: Pulmonary Disease

## 2014-06-04 NOTE — Progress Notes (Signed)
Today, [name] exercised at [facility]. Service time was from 11:00am to 12:20pm.  The patient exercised for more than 31 minutes performing aerobic, strengthening, and stretching exercises. Oxygen saturation, heart rate, blood pressure, rate of perceived exertion, and shortness of breath were all monitored before, during, and after exercise. Fernand presented with no problems at today's exercise session.   There was no workload change during today's exercise session.  Pre-exercise vitals: . Weight kg: 104.6 . Liters of O2: ra . SpO2: 98 . HR: 108 . BP: 120/70 . CBG: na  Exercise vitals: . Highest heartrate:  128 . Lowest oxygen saturation: 99 . Highest blood pressure: 148/80 . Liters of 02: ra  Post-exercise vitals: . SpO2: 98 . HR: 105 . BP: 104/40 . Liters of O2: ra . CBG: na  Dr. Brand Males, Medical Director Dr. Algis Liming is immediately available during today's Pulmonary Rehab session for Dozier Berkovich Moncada on 06/04/14 at 10:30am class time.

## 2014-06-09 ENCOUNTER — Encounter (HOSPITAL_COMMUNITY): Payer: 59

## 2014-06-11 ENCOUNTER — Encounter (HOSPITAL_COMMUNITY): Payer: 59

## 2014-06-16 ENCOUNTER — Encounter (HOSPITAL_COMMUNITY)
Admission: RE | Admit: 2014-06-16 | Discharge: 2014-06-16 | Disposition: A | Discharge status: Home / Self Care | Payer: 59 | Source: Ambulatory Visit | Attending: Pulmonary Disease | Admitting: Pulmonary Disease

## 2014-06-16 NOTE — Progress Notes (Signed)
Today, Edward Warren exercised at [facility]. Service time was from 10:30a to 12:00p.  The patient exercised for more than 31 minutes performing aerobic, strengthening, and stretching exercises. Oxygen saturation, heart rate, blood pressure, rate of perceived exertion, and shortness of breath were all monitored before, during, and after exercise. Edward Warren presented with no problems at today's exercise session.   There was an increase in workload change during today's exercise session.  Pre-exercise vitals: . Weight kg: 105.2 . Liters of O2: ra . SpO2: 97 . HR: 95 . BP: 120/68 . CBG: na  Exercise vitals: . Highest heartrate:  128 . Lowest oxygen saturation: 96 . Highest blood pressure: 112/68 . Liters of 02: ra  Post-exercise vitals: . SpO2: 97 . HR: 105 . BP: 110/76 . Liters of O2: ra . CBG: na  Dr. Brand Males, Medical Director Dr. Tana Coast is immediately available during today's Pulmonary Rehab session for Edward Warren on 06/16/14 at 10:30a class time.

## 2014-06-18 ENCOUNTER — Encounter (HOSPITAL_COMMUNITY)
Admission: RE | Admit: 2014-06-18 | Discharge: 2014-06-18 | Disposition: A | Discharge status: Home / Self Care | Payer: Worker's Compensation | Source: Ambulatory Visit | Attending: Pulmonary Disease | Admitting: Pulmonary Disease

## 2014-06-18 NOTE — Progress Notes (Signed)
Today, Edward Warren exercised at [facility]. Service time was from 10:30a to 12:30p.  The patient exercised for more than 31 minutes performing aerobic, strengthening, and stretching exercises. Oxygen saturation, heart rate, blood pressure, rate of perceived exertion, and shortness of breath were all monitored before, during, and after exercise. Normal presented with no problems at today's exercise session. The patient attended education today with Jeanella Craze on Advanced Directives.  There was no workload change during today's exercise session.  Pre-exercise vitals: . Weight kg: 106.2 . Liters of O2: ra . SpO2: 96 . HR: 96 . BP: 110/64 . CBG: na  Exercise vitals: . Highest heartrate:  150 . Lowest oxygen saturation: 97 . Highest blood pressure: 142/80 . Liters of 02: ra  Post-exercise vitals: . SpO2: 96 . HR: 106 . BP: 124/80 . Liters of O2: ra . CBG: na  Dr. Brand Males, Medical Director Dr. Tana Coast is immediately available during today's Pulmonary Rehab session for Edward Warren on 06/18/14 at 10:30am class time.

## 2014-06-23 ENCOUNTER — Encounter (HOSPITAL_COMMUNITY)
Admission: RE | Admit: 2014-06-23 | Discharge: 2014-06-23 | Disposition: A | Discharge status: Home / Self Care | Payer: 59 | Source: Ambulatory Visit | Attending: Pulmonary Disease | Admitting: Pulmonary Disease

## 2014-06-23 DIAGNOSIS — J439 Emphysema, unspecified: Secondary | ICD-10-CM | POA: Insufficient documentation

## 2014-06-23 DIAGNOSIS — Z5189 Encounter for other specified aftercare: Secondary | ICD-10-CM | POA: Insufficient documentation

## 2014-06-23 NOTE — Progress Notes (Signed)
Today, [name] exercised at [facility]. Service time was from 10:30am to 12:00p.  The patient exercised for more than 31 minutes performing aerobic, strengthening, and stretching exercises. Oxygen saturation, heart rate, blood pressure, rate of perceived exertion, and shortness of breath were all monitored before, during, and after exercise. Irie presented with no problems at today's exercise session.   There was an increase in workload change during today's exercise session.  Pre-exercise vitals: . Weight kg: 106.5 . Liters of O2: ra . SpO2: 99 . HR: 96 . BP: 108/78 . CBG: na  Exercise vitals: . Highest heartrate:  138 . Lowest oxygen saturation: 98 . Highest blood pressure: 130/62 . Liters of 02: ra  Post-exercise vitals: . SpO2: 98 . HR: 117 . BP: 102/70 . Liters of O2: ra . CBG: na  Dr. Brand Males, Medical Director Dr. Tana Coast is immediately available during today's Pulmonary Rehab session for Tovia Kisner Kadar on 06/23/14 at 10:30am class time.

## 2014-06-25 ENCOUNTER — Encounter (HOSPITAL_COMMUNITY)
Admission: RE | Admit: 2014-06-25 | Discharge: 2014-06-25 | Disposition: A | Discharge status: Home / Self Care | Payer: 59 | Source: Ambulatory Visit | Attending: Pulmonary Disease | Admitting: Pulmonary Disease

## 2014-06-25 DIAGNOSIS — Z5189 Encounter for other specified aftercare: Secondary | ICD-10-CM | POA: Diagnosis not present

## 2014-06-25 NOTE — Progress Notes (Signed)
Today, Edward Warren exercised at [facility]. Service time was from 10:30a to 12:15p.  The patient exercised for more than 31 minutes performing aerobic, strengthening, and stretching exercises. Oxygen saturation, heart rate, blood pressure, rate of perceived exertion, and shortness of breath were all monitored before, during, and after exercise. Edward Warren presented with no problems at today's exercise session. The patient attended education today with Respiratory Therapy.   There was no workload change during today's exercise session.  Pre-exercise vitals: . Weight kg: 106.9 . Liters of O2: ra . SpO2: 98 . HR: 94 . BP: 116/66 . CBG: na  Exercise vitals: . Highest heartrate:  140 . Lowest oxygen saturation: 96 . Highest blood pressure: 140/72 . Liters of 02: ra  Post-exercise vitals: . SpO2: 98 . HR: 102 . BP: 124/70 . Liters of O2: ra . CBG: na  Dr. Brand Males, Medical Director Dr. Wyline Copas is immediately available during today's Pulmonary Rehab session for Edward Warren on 06/25/14 at 10:30am class time.

## 2014-06-30 ENCOUNTER — Other Ambulatory Visit: Payer: Self-pay | Admitting: Family Medicine

## 2014-07-01 NOTE — Telephone Encounter (Signed)
Prescribed by Dr Burnett Harry.

## 2014-07-02 ENCOUNTER — Ambulatory Visit: Payer: Self-pay | Admitting: Sports Medicine

## 2014-07-02 ENCOUNTER — Encounter: Payer: Self-pay | Admitting: Sports Medicine

## 2014-07-02 ENCOUNTER — Ambulatory Visit (INDEPENDENT_AMBULATORY_CARE_PROVIDER_SITE_OTHER): Payer: 59 | Admitting: Sports Medicine

## 2014-07-02 DIAGNOSIS — M1712 Unilateral primary osteoarthritis, left knee: Secondary | ICD-10-CM

## 2014-07-02 MED ORDER — MELOXICAM 15 MG PO TABS: 15.0000 mg | ORAL_TABLET | Freq: Every day | ORAL | Status: DC

## 2014-07-02 NOTE — Progress Notes (Signed)
  Subjective:    CC: Left knee swelling  HPI: There has been reaccumulation of fluid, unfortunately one of our nurses quit and did not do his Orthovisc approval, we will continue to work on it, until then he does desire aspiration and injection today, the most recent injection was 3 months ago. Pain is moderate, persistent.  Past medical history, Surgical history, Family history not pertinant except as noted below, Social history, Allergies, and medications have been entered into the medical record, reviewed, and no changes needed.   Review of Systems: No fevers, chills, night sweats, weight loss, chest pain, or shortness of breath.   Objective:    General: Well Developed, well nourished, and in no acute distress.  Neuro: Alert and oriented x3, extra-ocular muscles intact, sensation grossly intact.  HEENT: Normocephalic, atraumatic, pupils equal round reactive to light, neck supple, no masses, no lymphadenopathy, thyroid nonpalpable.  Skin: Warm and dry, no rashes. Cardiac: Regular rate and rhythm, no murmurs rubs or gallops, no lower extremity edema.  Respiratory: Clear to auscultation bilaterally. Not using accessory muscles, speaking in full sentences. Left Knee: Visibly swollen with a palpable fluid wave and a tense effusion. ROM normal in flexion and extension and lower leg rotation. Ligaments with solid consistent endpoints including ACL, PCL, LCL, MCL. Negative Mcmurray's and provocative meniscal tests. Non painful patellar compression. Patellar and quadriceps tendons unremarkable. Hamstring and quadriceps strength is normal.  Procedure: Real-time Ultrasound Guided aspiration/Injection of left knee Device: GE Logiq E  Verbal informed consent obtained.  Time-out conducted.  Noted no overlying erythema, induration, or other signs of local infection.  Skin prepped in a sterile fashion.  Local anesthesia: Topical Ethyl chloride.  With sterile technique and under real time  ultrasound guidance:  60 mL of straw-colored cloudy fluid aspirated, syringe switched and 2 mL kenalog 40, 4 mL lidocaine injected easily. Completed without difficulty  Pain immediately resolved suggesting accurate placement of the medication.  Advised to call if fevers/chills, erythema, induration, drainage, or persistent bleeding.  Images permanently stored and available for review in the ultrasound unit.  Impression: Technically successful ultrasound guided injection.  Impression and Recommendations:

## 2014-07-02 NOTE — Assessment & Plan Note (Signed)
Aspiration and injection as above, previous aspiration and injection was 3 months ago. We are again going to work on [employer]. We can aspirate his knee as often as possible, we can inject every 3 months, I will see him back for Orthovisc.

## 2014-07-20 ENCOUNTER — Ambulatory Visit: Payer: Self-pay | Admitting: Sports Medicine

## 2014-07-21 ENCOUNTER — Encounter (HOSPITAL_COMMUNITY)
Admission: RE | Admit: 2014-07-21 | Discharge: 2014-07-21 | Disposition: A | Discharge status: Home / Self Care | Payer: 59 | Source: Ambulatory Visit | Attending: Pulmonary Disease | Admitting: Pulmonary Disease

## 2014-07-21 DIAGNOSIS — J439 Emphysema, unspecified: Secondary | ICD-10-CM | POA: Diagnosis not present

## 2014-07-21 DIAGNOSIS — Z5189 Encounter for other specified aftercare: Secondary | ICD-10-CM | POA: Diagnosis present

## 2014-07-21 NOTE — Progress Notes (Signed)
Today, Edward Warren exercised at [facility]. Service time was from 10:30a to 12:15p.  The patient exercised for more than 31 minutes performing aerobic, strengthening, and stretching exercises. Oxygen saturation, heart rate, blood pressure, rate of perceived exertion, and shortness of breath were all monitored before, during, and after exercise. Bland presented with no problems at today's exercise session.   There was decrease in workload change during today's exercise session due to absence.  Pre-exercise vitals: . Weight kg: 104.8 . Liters of O2: ra . SpO2: 98 . HR: 91 . BP: 122/82 . CBG: na  Exercise vitals: . Highest heartrate:  111 . Lowest oxygen saturation: 92% . Highest blood pressure: 110/80 . Liters of 02: ra  Post-exercise vitals: . SpO2: 98 . HR: 95 . BP: 114/74 . Liters of O2: ra . CBG: na  Dr. Brand Males, Medical Director Dr. Candiss Norse is immediately available during today's Pulmonary Rehab session for Edward Warren on 07/21/14 at 10:30am class time.

## 2014-07-23 ENCOUNTER — Encounter (HOSPITAL_COMMUNITY): Payer: 59

## 2014-07-28 ENCOUNTER — Encounter (HOSPITAL_COMMUNITY): Payer: 59

## 2014-07-28 ENCOUNTER — Telehealth (HOSPITAL_COMMUNITY): Payer: Self-pay | Admitting: *Deleted

## 2014-07-30 ENCOUNTER — Encounter (HOSPITAL_COMMUNITY): Payer: 59

## 2014-08-04 ENCOUNTER — Encounter (HOSPITAL_COMMUNITY): Payer: 59

## 2014-08-06 ENCOUNTER — Encounter (HOSPITAL_COMMUNITY): Payer: 59

## 2014-08-11 ENCOUNTER — Telehealth: Payer: Self-pay | Admitting: Pulmonary Disease

## 2014-08-11 ENCOUNTER — Encounter (HOSPITAL_COMMUNITY)
Admission: RE | Admit: 2014-08-11 | Discharge: 2014-08-11 | Disposition: A | Discharge status: Home / Self Care | Payer: 59 | Source: Ambulatory Visit | Attending: Pulmonary Disease | Admitting: Pulmonary Disease

## 2014-08-11 DIAGNOSIS — Z5189 Encounter for other specified aftercare: Secondary | ICD-10-CM | POA: Diagnosis not present

## 2014-08-11 NOTE — Progress Notes (Signed)
Andranik completed a Six-Minute Walk Test on 08/11/14 . Jatavious walked 1,567 feet with 0 breaks.  That is an improvement from patient's 6MWT on 03/18/15 where he walked 1,451 feet.  The patient's lowest oxygen saturation was 97% , highest heart rate was 115 bpm , and highest blood pressure was 140/84. The patient was on room air. Nehemias stated that gout and swollen knee hindered his walk test.

## 2014-08-11 NOTE — Telephone Encounter (Signed)
lmtcb for pulm rehab.

## 2014-08-12 NOTE — Telephone Encounter (Signed)
lmtcb for pulm rehab.

## 2014-08-13 ENCOUNTER — Encounter (HOSPITAL_COMMUNITY): Payer: 59

## 2014-08-13 NOTE — Progress Notes (Signed)
Pulmonary Rehabilitation Discharge Note: Edward Warren has been discharged from pulmonary rehab after successful completion of 19 exercise/education sessions. Unfortunately, Edward Warren has had a minor decline in his ability to exercise related to an arthritic knee and gout in his great toe. He is currently under medical care for his knee and has been encourage to seek care for the gout. He states he has had several "flare ups" but has never talked to the doctor about treatment for the gout. All of his personal goals remain unmet. He has yet to find a hobby to occupy his time since his untimely retirement r/t his pulmonary disease. He continues to smoke although he states he has "slowed down". He has not been able to establish a routine exercise schedule because of his arthritis and gout. He has also not lost any weight over his time in the program. Edward Warren did increase his stamina and strength as evidenced by his ability to walk an additional 116 feet on his discharge 6 minute walk test. Edward Warren request to continue his post discharge exercise in our maintenance program, but he will not start until his knee swelling and gout pain are under control.

## 2014-08-13 NOTE — Telephone Encounter (Signed)
Edward Warren from pulm rehab states she received order and no need to cb. She says thanks

## 2014-08-18 ENCOUNTER — Encounter (HOSPITAL_COMMUNITY): Payer: 59

## 2014-08-27 ENCOUNTER — Other Ambulatory Visit: Payer: Self-pay | Admitting: Family Medicine

## 2014-09-04 ENCOUNTER — Ambulatory Visit (INDEPENDENT_AMBULATORY_CARE_PROVIDER_SITE_OTHER): Payer: 59 | Admitting: Family Medicine

## 2014-09-04 ENCOUNTER — Encounter: Payer: Self-pay | Admitting: Family Medicine

## 2014-09-04 VITALS — BP 130/92 | HR 101 | Ht 71.0 in | Wt 227.0 lb

## 2014-09-04 DIAGNOSIS — I10 Essential (primary) hypertension: Secondary | ICD-10-CM

## 2014-09-04 DIAGNOSIS — F17200 Nicotine dependence, unspecified, uncomplicated: Secondary | ICD-10-CM

## 2014-09-04 DIAGNOSIS — Z720 Tobacco use: Secondary | ICD-10-CM | POA: Diagnosis not present

## 2014-09-04 DIAGNOSIS — J410 Simple chronic bronchitis: Secondary | ICD-10-CM | POA: Diagnosis not present

## 2014-09-04 NOTE — Progress Notes (Signed)
   Subjective:    Patient ID: Edward Warren, male    DOB: 1956/01/21, 59 y.o.   MRN: [id number]  HPI Hypertension- Pt denies chest pain, SOB, dizziness, or heart palpitations.  Taking meds as directed w/o problems.  Denies medication side effects.    Tob abuse - using the nicotine patch. He is on 21 mcg . He says it is going well.    Hsnt' worked since July 17th. Retired at the beginning of February.    COPD - HE is on Symbicort and Breo.  Says the pulmonary rehab was really helpful.  Feels like the breathing techniques are really helpful.  Will f/u later this month with pulmonologist.     Review of Systems     Objective:   Physical Exam  Constitutional: He is oriented to person, place, and time. He appears well-developed and well-nourished.  HENT:  Head: Normocephalic and atraumatic.  Cardiovascular: Normal rate, regular rhythm and normal heart sounds.   Pulmonary/Chest: Effort normal and breath sounds normal.  Expiratory wheeze diffusely.   Neurological: He is alert and oriented to person, place, and time.  Skin: Skin is warm and dry.  Psychiatric: He has a normal mood and affect. His behavior is normal.          Assessment & Plan:  HTN - Diastolic is still elevated.  He has pulm f/u in about a month. If BP still elevated, then we can adjust medication regimen. The last meal was here his blood pressure looks fantastic so hesitate to change his regimen based on today's reading. Make sure he is on track with low-salt diet and regular exercise.  Tobacco abuse-he's doing well nicotine patch for smoking cessation.  COPD - working on smoking cessation. Doing very well on current regimen and has appointment later this month with pulmonology.a

## 2014-09-11 ENCOUNTER — Ambulatory Visit (INDEPENDENT_AMBULATORY_CARE_PROVIDER_SITE_OTHER): Payer: 59 | Admitting: Sports Medicine

## 2014-09-11 ENCOUNTER — Encounter: Payer: Self-pay | Admitting: Sports Medicine

## 2014-09-11 VITALS — BP 137/89 | HR 82 | Ht 71.0 in | Wt 229.0 lb

## 2014-09-11 DIAGNOSIS — M1712 Unilateral primary osteoarthritis, left knee: Secondary | ICD-10-CM | POA: Diagnosis not present

## 2014-09-11 NOTE — Progress Notes (Signed)
  Subjective:    CC: Left knee swelling  HPI: Edward Warren is a pleasant 59 year old male with left knee osteoarthritis, he gets frequent effusions, and his last injection was in February of this year. As expected he has developed a recurrence of effusion, and desires drainage. Symptoms are moderate, persistent, no mechanical symptoms. He has not yet been approved for Orthovisc injections.  Past medical history, Surgical history, Family history not pertinant except as noted below, Social history, Allergies, and medications have been entered into the medical record, reviewed, and no changes needed.   Review of Systems: No fevers, chills, night sweats, weight loss, chest pain, or shortness of breath.   Objective:    General: Well Developed, well nourished, and in no acute distress.  Neuro: Alert and oriented x3, extra-ocular muscles intact, sensation grossly intact.  HEENT: Normocephalic, atraumatic, pupils equal round reactive to light, neck supple, no masses, no lymphadenopathy, thyroid nonpalpable.  Skin: Warm and dry, no rashes. Cardiac: Regular rate and rhythm, no murmurs rubs or gallops, no lower extremity edema.  Respiratory: Clear to auscultation bilaterally. Not using accessory muscles, speaking in full sentences. Left Knee: Visibly swollen with a palpable effusion and a fluid wave ROM normal in flexion and extension and lower leg rotation. Ligaments with solid consistent endpoints including ACL, PCL, LCL, MCL. Negative Mcmurray's and provocative meniscal tests. Non painful patellar compression. Patellar and quadriceps tendons unremarkable. Hamstring and quadriceps strength is normal.  Procedure: Real-time Ultrasound Guided aspiration of left knee Device: GE Logiq E  Verbal informed consent obtained.  Time-out conducted.  Noted no overlying erythema, induration, or other signs of local infection.  Skin prepped in a sterile fashion.  Local anesthesia: Topical Ethyl chloride.  With  sterile technique and under real time ultrasound guidance:  Advanced an 18-gauge needle into the suprapatellar recess, using gentle pressure I was able to aspirate approximately 15 mL of straw-colored fluid. Completed without difficulty  Pain immediately resolved suggesting accurate placement of the medication.  Advised to call if fevers/chills, erythema, induration, drainage, or persistent bleeding.  Images permanently stored and available for review in the ultrasound unit.  Impression: Technically successful ultrasound guided injection.  Impression and Recommendations:

## 2014-09-11 NOTE — Assessment & Plan Note (Signed)
Previous injection was in February, repeat aspiration today no injection. Small amount aspirated, we will get him set up for viscous supplementation, I will see him back once approved. I can do aspirations as often as desired, injections can only be done every 3 months.

## 2014-09-14 ENCOUNTER — Telehealth: Payer: Self-pay | Admitting: *Deleted

## 2014-09-14 ENCOUNTER — Ambulatory Visit: Payer: Self-pay | Admitting: Sports Medicine

## 2014-09-14 NOTE — Telephone Encounter (Signed)
Fax sent, confirmation page received and placed in scan.Edward Warren [place]

## 2014-09-18 ENCOUNTER — Ambulatory Visit: Payer: Self-pay | Admitting: Family Medicine

## 2014-09-25 ENCOUNTER — Telehealth: Payer: Self-pay | Admitting: Sports Medicine

## 2014-09-25 NOTE — Telephone Encounter (Signed)
Received PA for OrthoVisc injections. Completed form and faxed clinicals back to [facility] for approval.

## 2014-09-29 MED ORDER — SODIUM HYALURONATE (VISCOSUP) 25 MG/2.5ML IX SOSY: PREFILLED_SYRINGE | INTRA_ARTICULAR | Status: DC

## 2014-09-29 NOTE — Telephone Encounter (Signed)
Rx faxed

## 2014-09-29 NOTE — Telephone Encounter (Signed)
Received notification from [facility] that Rx was denied. Information given to Dr. Darene Lamer for possible appeal.

## 2014-09-29 NOTE — Telephone Encounter (Signed)
Edward Warren is preferred, lets just order that, rx in my box.

## 2014-10-01 ENCOUNTER — Ambulatory Visit: Payer: Self-pay | Admitting: Family Medicine

## 2014-10-08 NOTE — Telephone Encounter (Signed)
[employer]z approved. PA#: [id number]. Faxed written/signed Rx along with approval letter to [facility] for processing.  Valid dates on approval: 10/06/14-12/05/14. Patient notified.

## 2014-10-09 ENCOUNTER — Other Ambulatory Visit: Payer: Self-pay | Admitting: Family Medicine

## 2014-10-12 ENCOUNTER — Ambulatory Visit (INDEPENDENT_AMBULATORY_CARE_PROVIDER_SITE_OTHER): Payer: 59 | Admitting: Family Medicine

## 2014-10-12 ENCOUNTER — Encounter: Payer: Self-pay | Admitting: Family Medicine

## 2014-10-12 VITALS — BP 130/80 | HR 57 | Wt 228.0 lb

## 2014-10-12 DIAGNOSIS — H1013 Acute atopic conjunctivitis, bilateral: Secondary | ICD-10-CM | POA: Diagnosis not present

## 2014-10-12 DIAGNOSIS — I10 Essential (primary) hypertension: Secondary | ICD-10-CM | POA: Diagnosis not present

## 2014-10-12 DIAGNOSIS — Z720 Tobacco use: Secondary | ICD-10-CM

## 2014-10-12 MED ORDER — ALBUTEROL SULFATE HFA 108 (90 BASE) MCG/ACT IN AERS: INHALATION_SPRAY | RESPIRATORY_TRACT | Status: DC

## 2014-10-12 NOTE — Patient Instructions (Signed)
Zaditor for allergic eyes.

## 2014-10-12 NOTE — Progress Notes (Signed)
   Subjective:    Patient ID: Edward Warren, male    DOB: 10-21-1955, 59 y.o.   MRN: [id number]  HPI Hypertension- Pt denies chest pain, SOB, dizziness, or heart palpitations.  Taking meds as directed w/o problems.  Denies medication side effects.  This is a one-month follow-up. The last my saw him his diastolic pressure was still high. He really wanted to work on diet and exercise before adjusting his regimen.  pt reports that friday his R eye became red and swollen and this morning the L eye did the same he has been using clear eyes. Eyes feel itchey and has noticed small crust in the corners.    Tob abuse - Using the nicotine patch on and off. Hasn't been as consistant with quitting smoking.   Review of Systems     Objective:   Physical Exam  Constitutional: He is oriented to person, place, and time. He appears well-developed and well-nourished.  HENT:  Head: Normocephalic and atraumatic.  Eyes: EOM are normal. Pupils are equal, round, and reactive to light. Right eye exhibits no discharge. Left eye exhibits no discharge. No scleral icterus.  A little bit of injection of the sclera with some mild swelling of the lower lids bilaterally. No crusting or drainage.  Cardiovascular: Normal rate, regular rhythm and normal heart sounds.   Pulmonary/Chest: Effort normal and breath sounds normal.  Neurological: He is alert and oriented to person, place, and time.  Skin: Skin is warm and dry.  Psychiatric: He has a normal mood and affect. His behavior is normal.        Assessment & Plan:  Hypertension-well-controlled. We'll continue to take current regimen but will continue to monitor carefully.  Conjunctivitis, allergic  - Recommend trial of Zaditor.   Tobacco abuse- Continue to work on it daily.  Just encouraged him to continue to try to quit smoking.

## 2014-11-17 ENCOUNTER — Telehealth: Payer: Self-pay | Admitting: *Deleted

## 2014-11-17 NOTE — Telephone Encounter (Signed)
Pt called and stated that his lawyer requested his records last week and they have not received them. I checked on this and [employer] sent these on 11/16/14. He stated that the notes are needed by 11/24/14. OV notes printed and faxed to [employer] firm [phone].Audelia Hives [place]

## 2014-11-17 NOTE — Telephone Encounter (Signed)
Notes faxed, confirmation received.Edward Warren [place]

## 2014-12-02 ENCOUNTER — Ambulatory Visit (INDEPENDENT_AMBULATORY_CARE_PROVIDER_SITE_OTHER): Payer: 59 | Admitting: Family Medicine

## 2014-12-02 ENCOUNTER — Encounter: Payer: Self-pay | Admitting: Family Medicine

## 2014-12-02 VITALS — BP 145/96 | HR 77 | Wt 225.0 lb

## 2014-12-02 DIAGNOSIS — M1712 Unilateral primary osteoarthritis, left knee: Secondary | ICD-10-CM

## 2014-12-02 NOTE — Assessment & Plan Note (Signed)
Exacerbation of chronic problem. Moderate complexity. Status post aspiration and injection. Fluid not sent for analysis. Patient's insurance will not cover affordably viscous supplementation. Discussed with patient developing need for total joint replacement. He will return as needed.

## 2014-12-02 NOTE — Patient Instructions (Signed)
Thank you for coming in today. Call or go to the ER if you develop a large red swollen joint with extreme pain or oozing puss.  Starts thinking about knee replacement. Investigate Dr. Noemi Chapel and Dr. Percell Miller and Dr. Berenice Primas in [place].

## 2014-12-02 NOTE — Progress Notes (Signed)
Edward Warren is a 59 y.o. male who presents to [facility]  today for Left knee pain and swelling. Patient has a history of DJD of the left knee. He has intermittent exacerbations of pain. He notes that the last week he's had worsening pain and swelling. He has difficulty walking due to the pain. He denies any new injury radiating pain weakness or numbness fevers or chills. He takes meloxicam which helps some. He would like aspiration and injection if possible.  Patient last had aspiration and injection of his knees and cortical steroid to April 2016. He has attempted to obtain viscous supplementation however it will cost at least $1000 for his insurance.   Past Medical History  Diagnosis Date  . COPD (chronic obstructive pulmonary disease)     PFTS: 7-10: TEV1 ration of 62, FEV1 63% and FVC 80% (mod, class II)  . History of shingles   . Personal history of colonic adenomas 07/05/2012  . Hypertension   . Emphysema of lung   . 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 Colonoscopy  multiple   History  Substance Use Topics  . Smoking status: Current Every Day Smoker -- 0.10 packs/day for 42 years    Types: Cigarettes  . Smokeless tobacco: Never Used     Comment: Smokes 3-4 cigarettes a week 12/8  . Alcohol Use: 3.0 oz/week    5 Standard drinks or equivalent per week   ROS as above Medications: Current Outpatient Prescriptions  Medication Sig Dispense Refill  . albuterol (PROAIR HFA) 108 (90 BASE) MCG/ACT inhaler INHALE 2 PUFFS INTO THE LUNGS EVERY 6 (SIX) HOURS AS NEEDED. 8.5 each 2  . AMBULATORY NON FORMULARY MEDICATION Cane to use for ambulation 1 each 0  . BREO ELLIPTA 100-25 MCG/INH AEPB USE ONE INHALATION ONCE DAILY 60 each 0  . [facility] N[facility] STEP 1 21 MG/24HR patch PLACE 1 PATCH (21 MG TOTAL) ONTO THE SKIN DAILY. 28 patch 1  . HYDROcodone-acetaminophen (NORCO/VICODIN) 5-325 MG per  tablet Take 1-2 tablets by mouth every 4 (four) hours as needed for severe pain. Take with food. 12 tablet 0  . meloxicam (MOBIC) 15 MG tablet Take 1 tablet (15 mg total) by mouth daily. 90 tablet 3  . Sodium Hyaluronate (SUPARTZ) 25 MG/2.5ML SOSY Inject 1 syringe into the left knee weekly for 5 weeks 5 Syringe 0  . SPIRIVA HANDIHALER 18 MCG inhalation capsule INHALE ONE CAPSULE DAILY 30 capsule 1  . TRIBENZOR 40-5-25 MG TABS TAKE 1 TABLET BY MOUTH DAILY. 90 tablet 1   No current facility-administered medications for this visit.   Allergies  Allergen Reactions  . Prednisone Other (See Comments)    Nightmares and suicidal thoughts     Exam:  BP 145/96 mmHg  Pulse 77  Wt 225 lb (102.059 kg) Gen: Well NAD HEENT: EOMI,  MMM Lungs: Normal work of breathing. CTABL Heart: RRR no MRG Abd: NABS, Soft. Nondistended,  Left knee: Nontender left knee effusion. Range of motion 0-120 stable ligamentous exam nontender. Exts: Brisk capillary refill, warm and well perfused.   Procedure: Real-time Ultrasound Guided Injection of left knee  Device: GE Logiq E  Verbal informed consent obtained.  Time-out conducted.  Noted no overlying erythema, induration, or other signs of local infection.  Skin prepped in a sterile fashion.  Local anesthesia: Topical Ethyl chloride.  With sterile technique and under real  time ultrasound guidance: 18 gauge needle 15 mLof straw-colored fluid removed. Needle left in place syringe is exchanged, and 1 mL kenalog 80, 4 mL Marcaine injected easily.  Completed without difficulty  Pain immediately resolved suggesting accurate placement of the medication.  Advised to call if fevers/chills, erythema, induration, drainage, or persistent bleeding.  Images permanently stored and available for review in the ultrasound unit.  Impression: Technically successful ultrasound guided injection.   No results found for this or any previous visit (from the past 24  hour(s)). No results found.   Please see individual assessment and plan sections. This visit required moderate complexity and decision making.

## 2014-12-03 ENCOUNTER — Telehealth: Payer: Self-pay

## 2014-12-03 ENCOUNTER — Telehealth: Payer: Self-pay | Admitting: Pulmonary Disease

## 2014-12-03 NOTE — Telephone Encounter (Signed)
lmtcb x1 for Edward Warren.

## 2014-12-03 NOTE — Telephone Encounter (Signed)
Dr Patrick Jupiter will be doing patient's disability paperwork. He left his number if you needed to add information to his disability case.   [phone] opt 1

## 2014-12-04 NOTE — Telephone Encounter (Signed)
lmtcb x1 for [employer]

## 2014-12-04 NOTE — Telephone Encounter (Signed)
Spoke with [employer]. States that Dr. Patrick Jupiter will need to speak to RA directly to have these questions answered.  RA - please return this call. Thanks.

## 2014-12-04 NOTE — Telephone Encounter (Signed)
lmtcb x2 for Edward Warren.

## 2014-12-04 NOTE — Telephone Encounter (Signed)
Need a direct line where I can reach this doctor Was placed on hold for several mins

## 2014-12-07 NOTE — Telephone Encounter (Signed)
Spoke with Edward Warren at Dr. [place] office, states that Dr. Patrick Jupiter would have reached out to Dr. Elsworth Soho if he needed any particular info, but this pt's paperwork has been filled out already.    Forwarding to RA to make aware.  Nothing further needed.

## 2014-12-07 NOTE — Telephone Encounter (Signed)
lmtcb X2 for [employer]

## 2014-12-09 ENCOUNTER — Telehealth: Payer: Self-pay | Admitting: Pulmonary Disease

## 2014-12-09 NOTE — Telephone Encounter (Signed)
Discussed disability with dr Patrick Jupiter [phone] -independent peer review Advised - Avoid extremes of temp, chemicals, odors etc Functional capacity - moderate

## 2014-12-10 ENCOUNTER — Ambulatory Visit (INDEPENDENT_AMBULATORY_CARE_PROVIDER_SITE_OTHER): Payer: 59 | Admitting: Pulmonary Disease

## 2014-12-10 ENCOUNTER — Encounter: Payer: Self-pay | Admitting: Pulmonary Disease

## 2014-12-10 VITALS — BP 122/80 | HR 97 | Ht 71.0 in | Wt 218.0 lb

## 2014-12-10 DIAGNOSIS — Z720 Tobacco use: Secondary | ICD-10-CM

## 2014-12-10 DIAGNOSIS — F17200 Nicotine dependence, unspecified, uncomplicated: Secondary | ICD-10-CM

## 2014-12-10 MED ORDER — TIOTROPIUM BROMIDE MONOHYDRATE 18 MCG IN CAPS: 18.0000 ug | ORAL_CAPSULE | Freq: Every day | RESPIRATORY_TRACT | Status: DC

## 2014-12-10 MED ORDER — FLUTICASONE FUROATE-VILANTEROL 100-25 MCG/INH IN AEPB: 1.0000 | INHALATION_SPRAY | Freq: Every day | RESPIRATORY_TRACT | Status: DC

## 2014-12-10 NOTE — Assessment & Plan Note (Signed)
Ct nicotine patch Does not want chantix counselled

## 2014-12-10 NOTE — Patient Instructions (Signed)
Refills will be sent on breo & spiriva Try to QUIT smoking

## 2014-12-10 NOTE — Progress Notes (Signed)
   Subjective:    Patient ID: Edward Warren, male    DOB: [birth year], 59 y.o.   MRN: [id number]  HPI  59 year old smoker presents for FU of COPD- gold B.  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 He smoked about a pack per day since his teenage years-about 40 pack years He also goes to the [place] at [place], but wants to establish with me as his local pulmonologist.  He  had a COPD exacerbation and has been taken out of work from 7/20- 02/03/14. 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 This makes him more short of breath and makes him wheeze.     12/10/2014  Chief Complaint  Patient presents with  . Follow-up    patient still on same medications, still struggling with SOB, cough; no other complaints   Completed pulm rehab Continues to smoke 2-4 /d Discussed disability with dr Patrick Jupiter [phone] -independent review Advised - Avoid extremes of temp, chemicals, odors etc Functional capacity - moderate  He is maintained on a regimen of Spiriva and Breo.  He is on disability  He is getting knee injections for osteoarthritis & wonders if he needs a replacement  Significant tests/ events PFTs - 12/2013 -FEV1 69%, ratio 63, DLCO 40 Spirometry in June 2014 showed an FEV1 of 2.57-68% with a ratio of 67, and FVC of 82% suggestive of moderate stage II COPD.  PFTs  11/2009 showed a DLCO of 64% and an FEV1 of 63% suggestive of moderate impairment.  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 Charlett Blake. CT chest without contrast in 09/2012 and in 06/2011 did not show any pleural or interstitial abnormality.   Meds- Chantix -caused dreams and made him depressed .  Prednisone caused him suicidal thoughts   echo stress test in 2013 that showed mitral stenosis  Review of Systems neg for any significant sore throat, dysphagia, itching, sneezing, nasal congestion or excess/ purulent  secretions, fever, chills, sweats, unintended wt loss, pleuritic or exertional cp, hempoptysis, orthopnea pnd or change in chronic leg swelling. Also denies presyncope, palpitations, heartburn, abdominal pain, nausea, vomiting, diarrhea or change in bowel or urinary habits, dysuria,hematuria, rash, arthralgias, visual complaints, headache, numbness weakness or ataxia.     Objective:   Physical Exam  Gen. Pleasant, well-nourished, in no distress ENT - no lesions, no post nasal drip Neck: No JVD, no thyromegaly, no carotid bruits Lungs: no use of accessory muscles, no dullness to percussion, clear without rales or rhonchi  Cardiovascular: Rhythm regular, heart sounds  normal, no murmurs or gallops, no peripheral edema Musculoskeletal: No deformities, no cyanosis or clubbing        Assessment & Plan:

## 2014-12-10 NOTE — Assessment & Plan Note (Signed)
Refills will be sent on breo & spiriva Try to QUIT smoking Ct exercise program Good luck with disability program - I have spoken to independent reviewer & given my recommendations - Avoid extremes of temp, chemicals, odors etc Functional capacity - moderate

## 2014-12-28 IMAGING — CR DG KNEE COMPLETE 4+V*L*
4 series · 4 of 4 positions shown · non-contrast
Comparison: None.

CLINICAL DATA: Knee pain.

EXAM:
LEFT KNEE - COMPLETE 4+ VIEW

[view not recorded (1 of 4)]
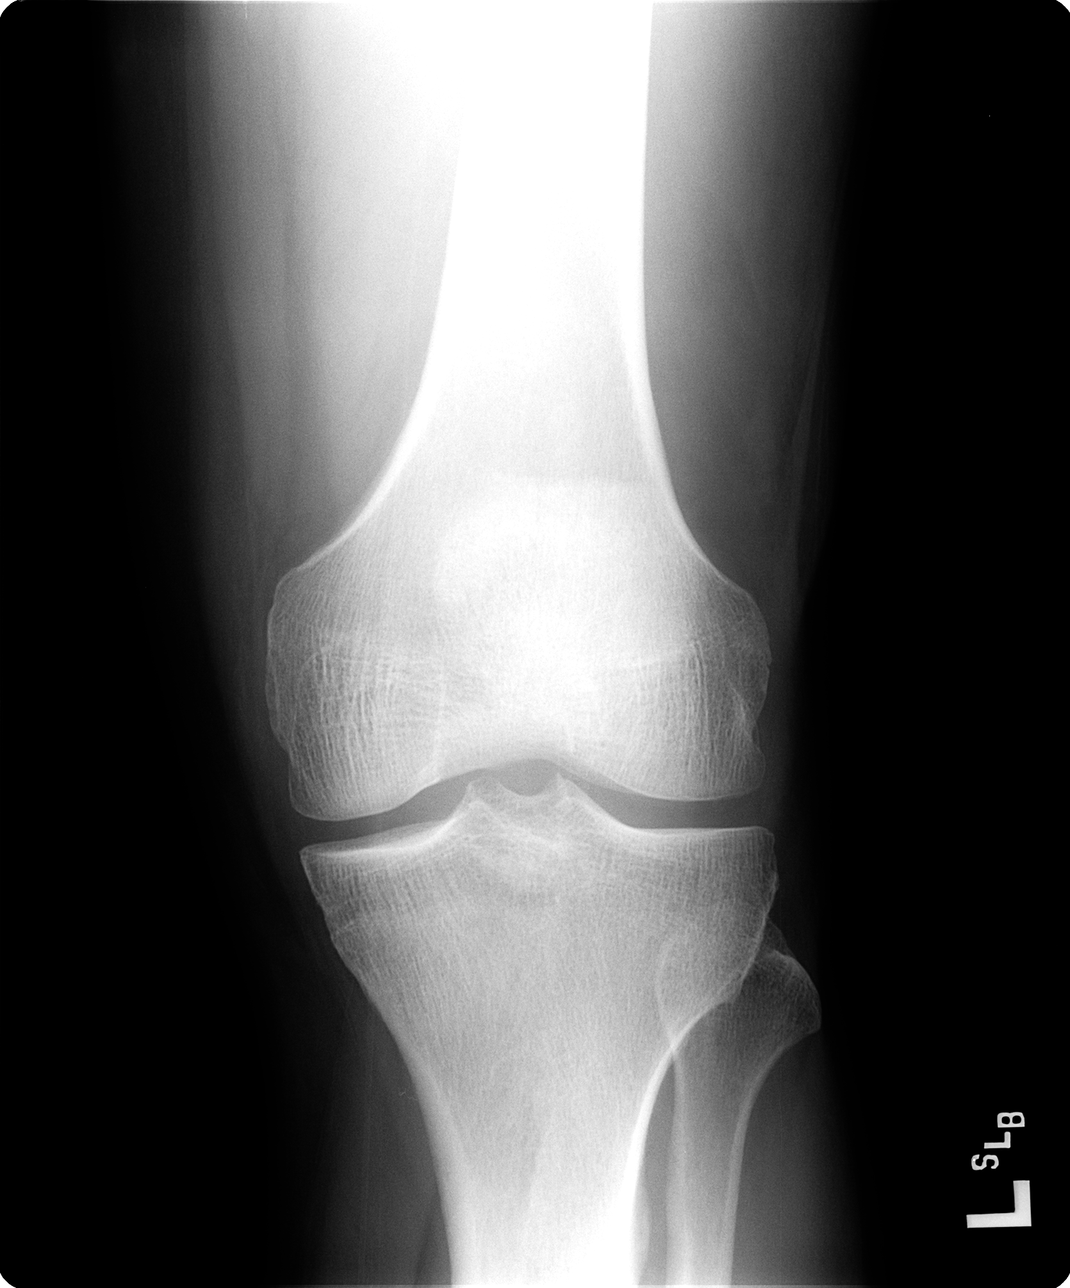

[view not recorded (2 of 4)]
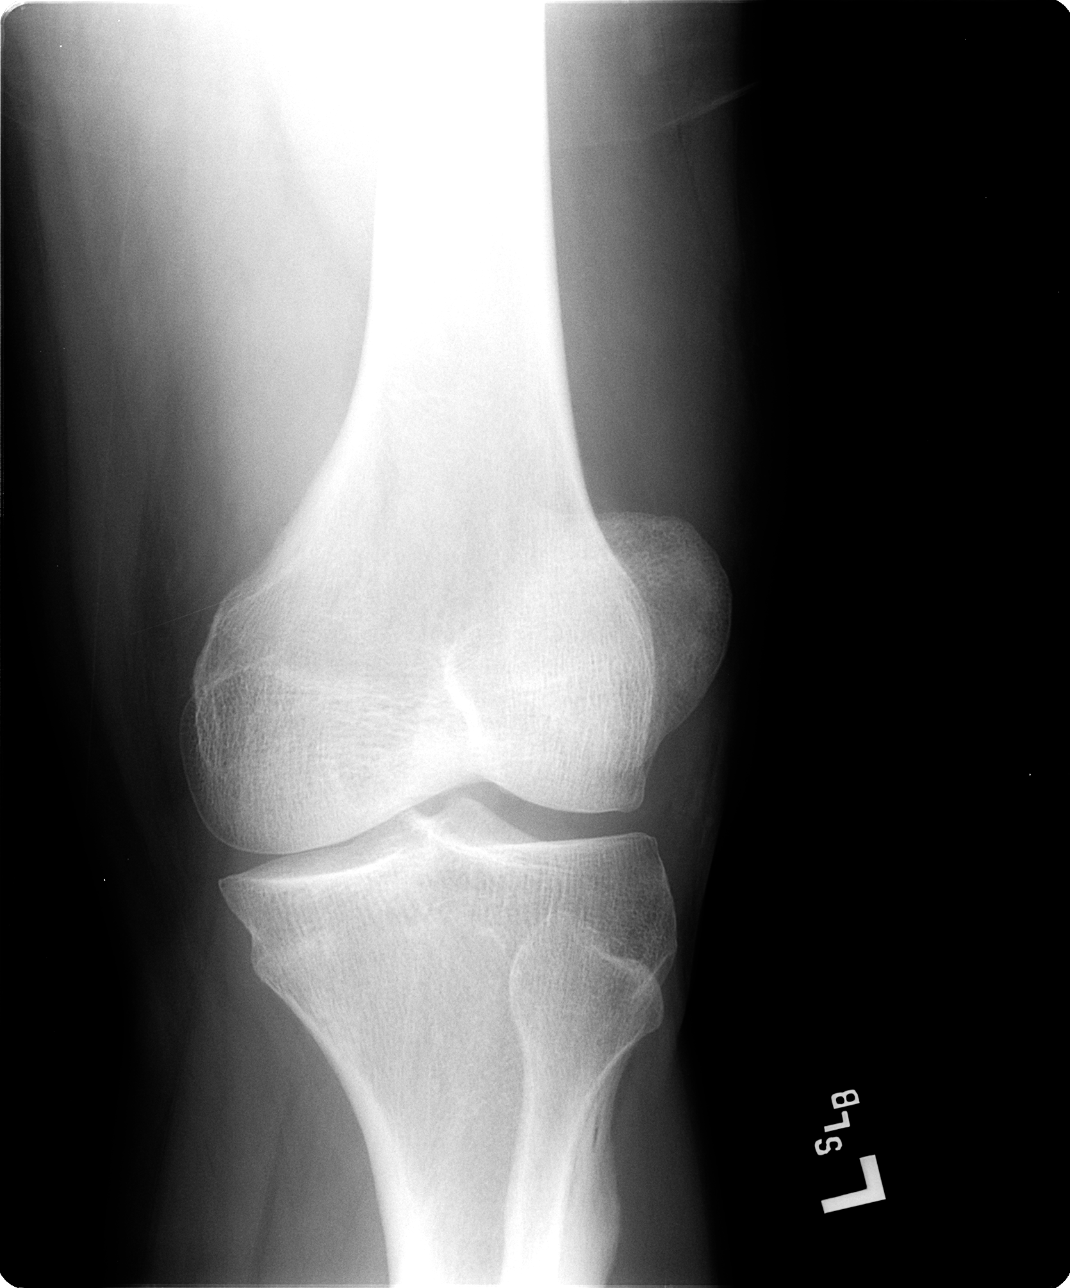

[view not recorded (3 of 4)]
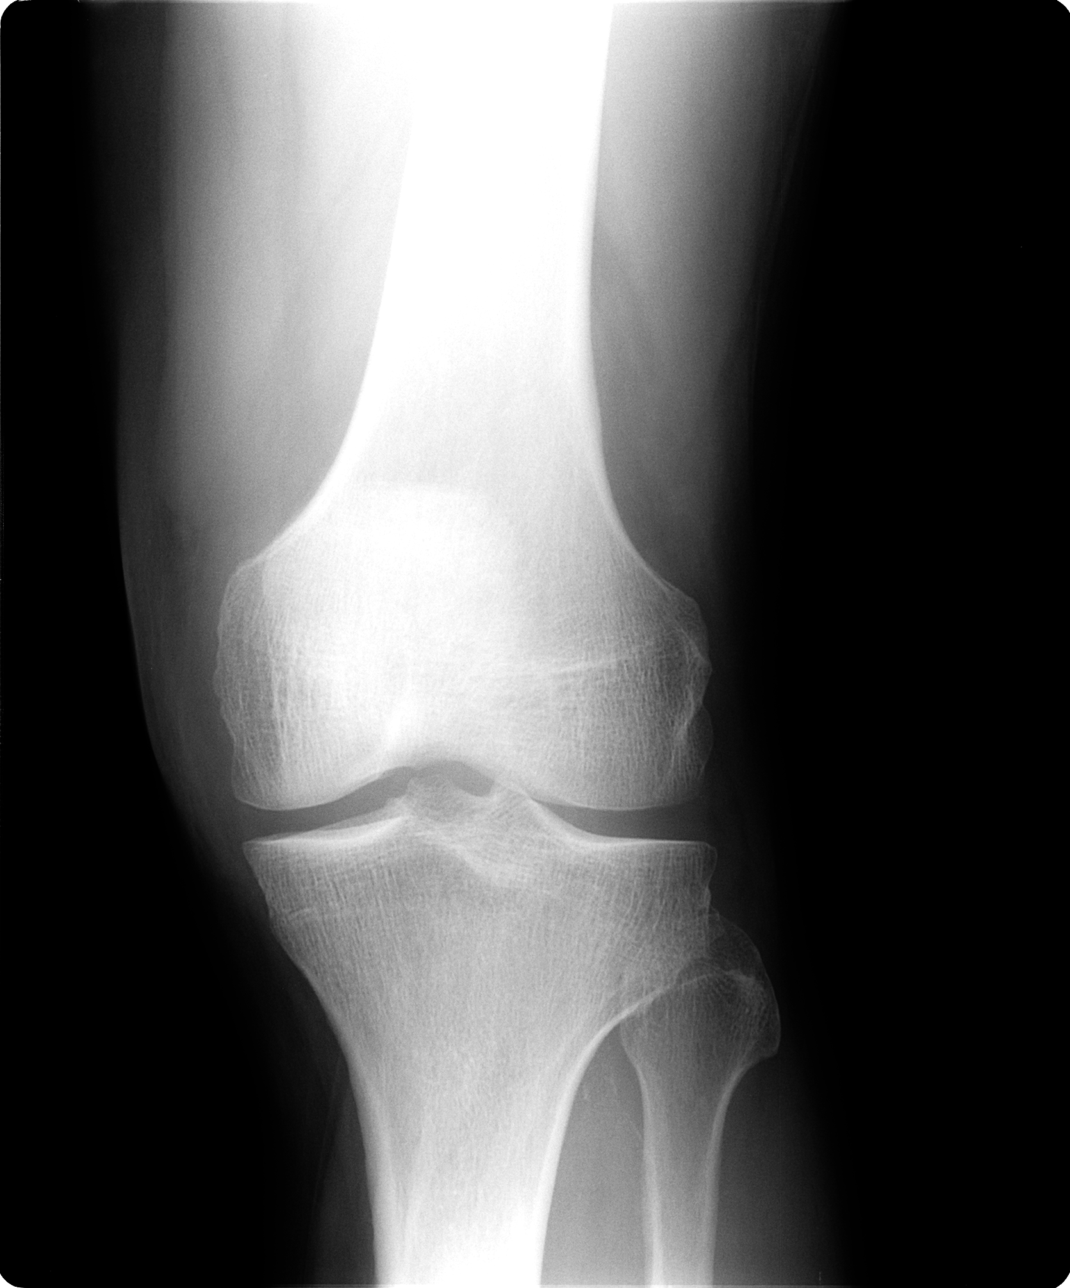

[view not recorded (4 of 4)]
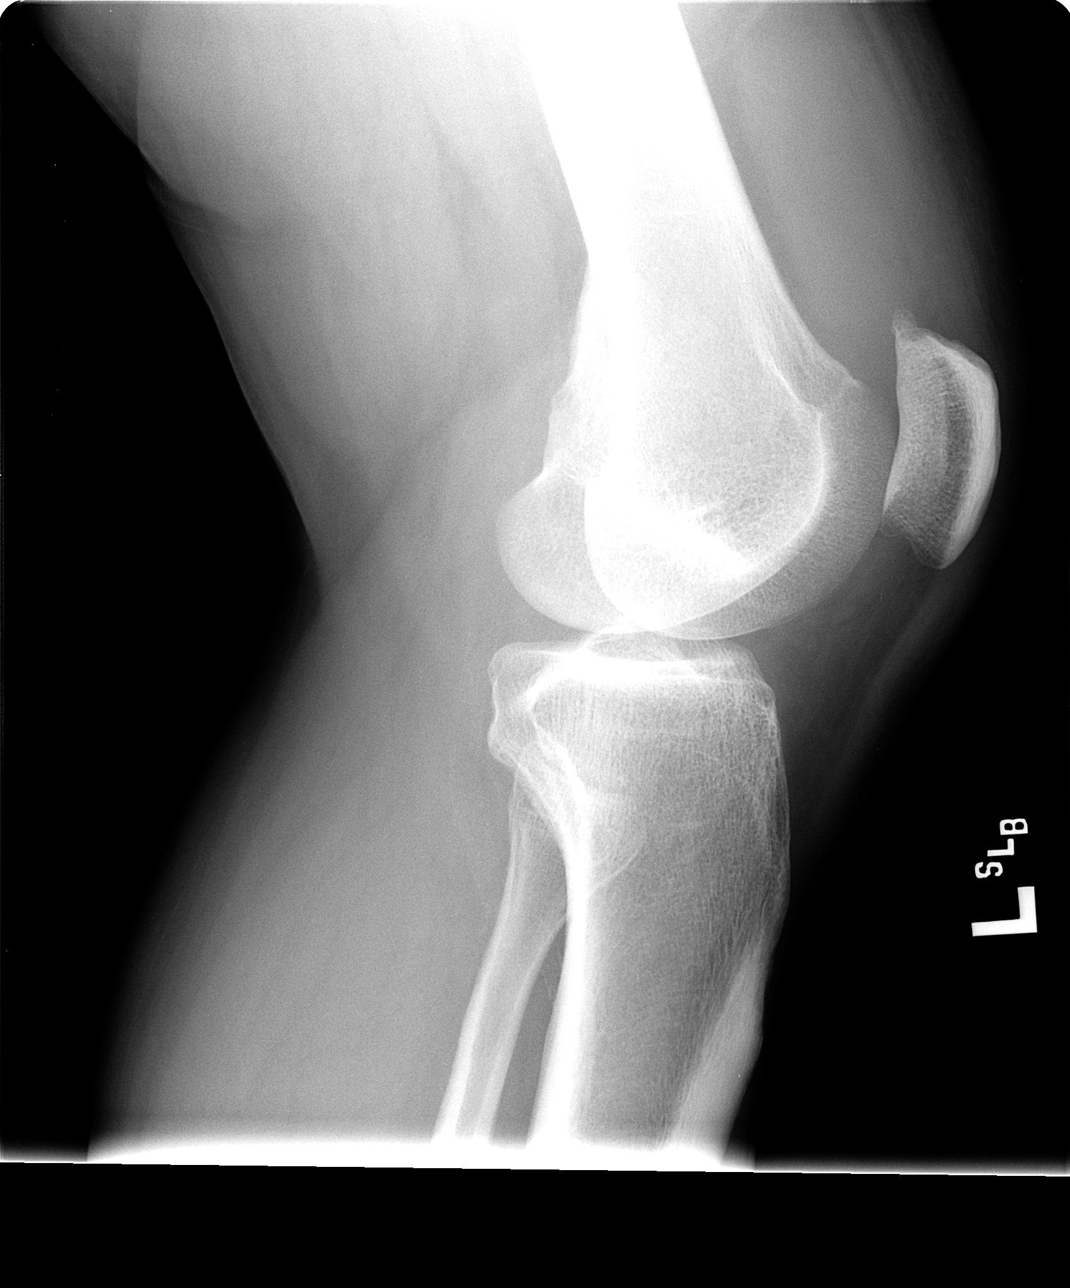

[4 of 4 positions shown; findings below may reference images not displayed]

FINDINGS: Knee joint effusion is noted. Patellofemoral degenerative change
present. No evidence of fracture or dislocation.
IMPRESSION: 1. Knee joint effusion.
2. Patellofemoral degenerative change.
3. No acute bony abnormality .

## 2015-02-08 ENCOUNTER — Other Ambulatory Visit: Payer: Self-pay | Admitting: Family Medicine

## 2015-03-08 ENCOUNTER — Encounter: Payer: Self-pay | Admitting: Family Medicine

## 2015-03-08 ENCOUNTER — Ambulatory Visit (INDEPENDENT_AMBULATORY_CARE_PROVIDER_SITE_OTHER): Payer: 59 | Admitting: Family Medicine

## 2015-03-08 VITALS — BP 121/88 | HR 88 | Wt 216.0 lb

## 2015-03-08 DIAGNOSIS — F17200 Nicotine dependence, unspecified, uncomplicated: Secondary | ICD-10-CM

## 2015-03-08 DIAGNOSIS — Z77090 Contact with and (suspected) exposure to asbestos: Secondary | ICD-10-CM | POA: Diagnosis not present

## 2015-03-08 DIAGNOSIS — I10 Essential (primary) hypertension: Secondary | ICD-10-CM | POA: Diagnosis not present

## 2015-03-08 DIAGNOSIS — Z23 Encounter for immunization: Secondary | ICD-10-CM | POA: Diagnosis not present

## 2015-03-08 DIAGNOSIS — J410 Simple chronic bronchitis: Secondary | ICD-10-CM

## 2015-03-08 MED ORDER — UMECLIDINIUM BROMIDE 62.5 MCG/INH IN AEPB: 1.0000 | INHALATION_SPRAY | Freq: Every day | RESPIRATORY_TRACT | Status: DC

## 2015-03-08 NOTE — Progress Notes (Signed)
Subjective:    Patient ID: Edward Warren, male    DOB: October 26, 1955, 59 y.o.   MRN: [id number]  HPI Hypertension- Pt denies chest pain, SOB, dizziness, or heart palpitations.  Taking meds as directed w/o problems.  Denies medication side effects.    Tob abuse - he is trying to cut back. Says the patches work but then pulls it off  COPD - he is unable to afford the Spiriva. He has a coupon card but is still costing him close to $80. He is using his preop irregularly.  Review of Systems  BP 121/88 mmHg  Pulse 88  Wt 216 lb (97.977 kg)  SpO2 100%    Allergies  Allergen Reactions  . Prednisone Other (See Comments)    Nightmares and suicidal thoughts    Past Medical History  Diagnosis Date  . COPD (chronic obstructive pulmonary disease) (HCC)     PFTS: 7-10: TEV1 ration of 62, FEV1 63% and FVC 80% (mod, class II)  . History of shingles   . Personal history of colonic adenomas 07/05/2012  . Hypertension   . Emphysema of lung ([place])   . Asbestosis ([place])     per patient dx by ct scan 2012    Past Surgical History  Procedure Laterality Date  . Colonoscopy  multiple    Social History   Social History  . Marital Status: Married    Spouse Name: N/A  . Number of Children: 1  . Years of Education: N/A   Occupational History  . disabled     [employer]   Social History Main Topics  . Smoking status: Current Every Day Smoker -- 0.10 packs/day for 42 years    Types: Cigarettes  . Smokeless tobacco: Never Used     Comment: Smokes 3-4 cigarettes a week 12/8  . Alcohol Use: 3.0 oz/week    5 Standard drinks or equivalent per week  . Drug Use: No  . Sexual Activity: Not on file   Other Topics Concern  . Not on file   Social History Narrative   No regular exercise.     Family History  Problem Relation Age of Onset  . Stroke Maternal Grandfather   . Breast cancer Maternal Aunt   . Cancer Maternal Uncle     throat  . Colon cancer Neg Hx   . COPD Mother   .  Heart attack Father     Outpatient Encounter Prescriptions as of 03/08/2015  Medication Sig  . albuterol (PROAIR HFA) 108 (90 BASE) MCG/ACT inhaler INHALE 2 PUFFS INTO THE LUNGS EVERY 6 (SIX) HOURS AS NEEDED.  [name] Kitchen AMBULATORY NON FORMULARY MEDICATION Cane to use for ambulation  . Fluticasone Furoate-Vilanterol (BREO ELLIPTA) 100-25 MCG/INH AEPB Take 1 puff by mouth daily.  [name] Kitchen HYDROcodone-acetaminophen (NORCO/VICODIN) 5-325 MG per tablet Take 1-2 tablets by mouth every 4 (four) hours as needed for severe pain. Take with food.  . meloxicam (MOBIC) 15 MG tablet Take 1 tablet (15 mg total) by mouth daily.  . Sodium Hyaluronate (SUPARTZ) 25 MG/2.5ML SOSY Inject 1 syringe into the left knee weekly for 5 weeks  . tiotropium (SPIRIVA HANDIHALER) 18 MCG inhalation capsule Place 1 capsule (18 mcg total) into inhaler and inhale daily.  Jabier Gauss 40-5-25 MG TABS TAKE 1 TABLET BY MOUTH DAILY.  [name] Kitchen Umeclidinium Bromide (INCRUSE ELLIPTA) 62.5 MCG/INH AEPB Inhale 1 Inhaler into the lungs daily. Patient will bring in coupon.  . [DISCONTINUED] [facility] NTS STEP 1 21 MG/24HR patch PLACE 1  PATCH (21 MG TOTAL) ONTO THE SKIN DAILY.   No facility-administered encounter medications on file as of 03/08/2015.          Objective:   Physical Exam  Constitutional: He is oriented to person, place, and time. He appears well-developed and well-nourished.  HENT:  Head: Normocephalic and atraumatic.  Cardiovascular: Normal rate, regular rhythm and normal heart sounds.   Pulmonary/Chest: Effort normal and breath sounds normal.  Neurological: He is alert and oriented to person, place, and time.  Skin: Skin is warm and dry.  Psychiatric: He has a normal mood and affect. His behavior is normal.          Assessment & Plan:  HTN - well controlled. Continue current regimen. Follow-up in 6 months.  Tob abuse - discussed ways to quit. We discussed he can also use Nicorette gum as a breakthrough while having the patch onset of  smoking. Discussed how to manage the gum and had a use it appropriately. Encouraged him to continue to work on cutting back.  COPD, history of asbestos exposure - will discontinue Spiriva because of cost and switch to Incruse which should be much more affordable. Coupon card given to the he can get free for one year.   Flu shot today.

## 2015-03-09 LAB — COMPLETE METABOLIC PANEL WITH GFR
ALBUMIN: 4.2 g/dL (ref 3.6–5.1)
ALK PHOS: 63 U/L (ref 40–115)
ALT: 30 U/L (ref 9–46)
AST: 29 U/L (ref 10–35)
BILIRUBIN TOTAL: 0.8 mg/dL (ref 0.2–1.2)
BUN: 15 mg/dL (ref 7–25)
CO2: 22 mmol/L (ref 20–31)
Calcium: 10.1 mg/dL (ref 8.6–10.3)
Chloride: 103 mmol/L (ref 98–110)
Creat: 1.01 mg/dL (ref 0.70–1.25)
GFR, EST NON AFRICAN AMERICAN: 80 mL/min (ref >=60)
GFR, Est African American: >89 mL/min (ref >=60)
Glucose, Bld: 101 mg/dL — ABNORMAL HIGH (ref 65–99)
Potassium: 3.8 mmol/L (ref 3.5–5.3)
SODIUM: 135 mmol/L (ref 135–146)
TOTAL PROTEIN: 6.9 g/dL (ref 6.1–8.1)

## 2015-03-09 LAB — LIPID PANEL
Cholesterol: 169 mg/dL (ref 125–200)
HDL: 86 mg/dL (ref >=40)
LDL Cholesterol: 63 mg/dL (ref <130)
TRIGLYCERIDES: 98 mg/dL (ref <150)
Total CHOL/HDL Ratio: 2 Ratio (ref <=5.0)
VLDL: 20 mg/dL (ref <30)

## 2015-03-15 ENCOUNTER — Ambulatory Visit (INDEPENDENT_AMBULATORY_CARE_PROVIDER_SITE_OTHER): Payer: 59 | Admitting: Family Medicine

## 2015-03-15 ENCOUNTER — Telehealth: Payer: Self-pay

## 2015-03-15 ENCOUNTER — Encounter: Payer: Self-pay | Admitting: Family Medicine

## 2015-03-15 VITALS — BP 152/97 | HR 96 | Wt 220.0 lb

## 2015-03-15 DIAGNOSIS — M1712 Unilateral primary osteoarthritis, left knee: Secondary | ICD-10-CM

## 2015-03-15 NOTE — Patient Instructions (Signed)
Thank you for coming in today. Call or go to the ER if you develop a large red swollen joint with extreme pain or oozing puss.  Return as needed for pain.

## 2015-03-15 NOTE — Telephone Encounter (Signed)
Please call the pharmacy.  The coupond card should make it free. The medicine was Incruse that I gave him so I am not sure aout the Proair. Can you call to clariry.

## 2015-03-15 NOTE — Telephone Encounter (Signed)
Pt is in today for a visit with Dr. Georgina Snell. He mentioned that you have rx'd PROAIR HFA and gave a discount card at his last office visit with you due to the cost of Spiriva which he was previously on. He stated that even with the discount card the [facility] was $300.00+ due to the rx not being covered by his insurance. Pt states that he has a "little bit more" spiriva that he will take until he runs out but would like to know if there is something more affordable the you can prescribe. Please Advise. Thank you.

## 2015-03-15 NOTE — Assessment & Plan Note (Signed)
Aspiration and injection today. Joint fluid Benign-appearing. Not cloudy consistent with DJD. Not sent for culture or cell analysis. Steroid injection today. Continue watchful waiting. Follow-up with PCP as needed.

## 2015-03-15 NOTE — Progress Notes (Signed)
Edward Warren is a 59 y.o. male who presents to [facility]  today for left knee pain. Patient has DJD of the left knee. In July he received steroid injection to the left knee which has done well until recently. He notes exacerbation of left knee pain associated with swelling. Pain is worse with activity. No radiating pain weakness or numbness fevers or chills. He would like repeat injection today if possible.   Past Medical History  Diagnosis Date  . COPD (chronic obstructive pulmonary disease) (HCC)     PFTS: 7-10: TEV1 ration of 62, FEV1 63% and FVC 80% (mod, class II)  . History of shingles   . Personal history of colonic adenomas 07/05/2012  . Hypertension   . Emphysema of lung ([place])   . 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 Colonoscopy  multiple   Social History  Substance Use Topics  . Smoking status: Current Every Day Smoker -- 0.10 packs/day for 42 years    Types: Cigarettes  . Smokeless tobacco: Never Used     Comment: Smokes 3-4 cigarettes a week 12/8  . Alcohol Use: 3.0 oz/week    5 Standard drinks or equivalent per week   family history includes Breast cancer in his maternal aunt; COPD in his mother; Cancer in his maternal uncle; Heart attack in his father; Stroke in his maternal grandfather. There is no history of Colon cancer.  ROS as above Medications: Current Outpatient Prescriptions  Medication Sig Dispense Refill  . AMBULATORY NON FORMULARY MEDICATION Cane to use for ambulation 1 each 0  . Fluticasone Furoate-Vilanterol (BREO ELLIPTA) 100-25 MCG/INH AEPB Take 1 puff by mouth daily. 60 each 5  . HYDROcodone-acetaminophen (NORCO/VICODIN) 5-325 MG per tablet Take 1-2 tablets by mouth every 4 (four) hours as needed for severe pain. Take with food. 12 tablet 0  . meloxicam (MOBIC) 15 MG tablet Take 1 tablet (15 mg total) by mouth daily. 90 tablet 3  . Sodium  Hyaluronate (SUPARTZ) 25 MG/2.5ML SOSY Inject 1 syringe into the left knee weekly for 5 weeks 5 Syringe 0  . tiotropium (SPIRIVA HANDIHALER) 18 MCG inhalation capsule Place 1 capsule (18 mcg total) into inhaler and inhale daily. 30 capsule 5  . TRIBENZOR 40-5-25 MG TABS TAKE 1 TABLET BY MOUTH DAILY. 90 tablet 1  . Umeclidinium Bromide (INCRUSE ELLIPTA) 62.5 MCG/INH AEPB Inhale 1 Inhaler into the lungs daily. Patient will bring in coupon. 30 each 11   No current facility-administered medications for this visit.   Allergies  Allergen Reactions  . Prednisone Other (See Comments)    Nightmares and suicidal thoughts     Exam:  BP 152/97 mmHg  Pulse 96  Wt 220 lb (99.791 kg) Gen: Well NAD Left knee moderate effusion. Range of motion 5-120. Stable ligamentous exam.   Procedure: Real-time Ultrasound Guided aspiration and Injection of left knee  Device: GE Logiq E  Images permanently stored and available for review in the ultrasound unit. Verbal informed consent obtained. Discussed risks and benefits of procedure. Warned about infection bleeding damage to structures skin hypopigmentation and fat atrophy among others. Patient expresses understanding and agreement Time-out conducted.  Noted no overlying erythema, induration, or other signs of local infection.  Skin prepped in a sterile fashion.  Local anesthesia: Ethel chloride and 2 mL of lidocaine without epinephrine With sterile technique and under real time ultrasound guidance:An  18-gauge needle was passed into the superior patellar space and 5 mL of a straw-colored yellowish liquid were removed. The syringe was exchanged and 80 mg of Depo-Medrol and 4 mL of Marcaine were injected Completed without difficulty  Pain immediately resolved suggesting accurate placement of the medication.  Advised to call if fevers/chills, erythema, induration, drainage, or persistent bleeding.  Images permanently stored and available for review in  the ultrasound unit.  Impression: Technically successful ultrasound guided injection.    No results found for this or any previous visit (from the past 24 hour(s)). No results found.   Please see individual assessment and plan sections.

## 2015-03-16 MED ORDER — ACLIDINIUM BROMIDE 400 MCG/ACT IN AEPB: 1.0000 | INHALATION_SPRAY | Freq: Two times a day (BID) | RESPIRATORY_TRACT | Status: DC

## 2015-03-16 NOTE — Telephone Encounter (Signed)
Pt notified and advised to contact his insurance. He verbalized understanding.

## 2015-03-16 NOTE — Telephone Encounter (Signed)
Called the pts pharmacy. They informed me that the coupon brings the pts copay down to $0 after the insurance has paid their portion. Being that the pts insurance does not cover this medication the coupon is not applicable. I do believe that the pt was confused about which medication it was. Should he have a proair inhaler on his medlist?

## 2015-03-16 NOTE — Telephone Encounter (Signed)
There are only 3 on the market,  Spiriva, Incruse, and tudorza. He may need to call his insurance to see which is best covered. If he is Medicare he may have hit his gout which might be also why it's not covered. I will go ahead and send over prescription for tudorza in its place.

## 2015-03-16 NOTE — Telephone Encounter (Signed)
Called and left detailed vm requesting a call back for clarity.

## 2015-03-17 ENCOUNTER — Telehealth: Payer: Self-pay

## 2015-03-17 NOTE — Telephone Encounter (Signed)
Called pt and informed him that Caprice Renshaw is not covered by his insurance. Pt states he understands and will continue using the current medication he has and will worry about the next step when he's due for a refill.

## 2015-03-30 ENCOUNTER — Other Ambulatory Visit: Payer: Self-pay | Admitting: Family Medicine

## 2015-06-10 ENCOUNTER — Ambulatory Visit: Payer: Self-pay | Admitting: Adult Health

## 2015-06-17 ENCOUNTER — Encounter: Payer: Self-pay | Admitting: Adult Health

## 2015-06-17 ENCOUNTER — Ambulatory Visit (INDEPENDENT_AMBULATORY_CARE_PROVIDER_SITE_OTHER): Payer: 59 | Admitting: Adult Health

## 2015-06-17 VITALS — BP 128/80 | HR 79 | Temp 97.9°F | Ht 71.0 in | Wt 222.0 lb

## 2015-06-17 DIAGNOSIS — F17200 Nicotine dependence, unspecified, uncomplicated: Secondary | ICD-10-CM

## 2015-06-17 DIAGNOSIS — J449 Chronic obstructive pulmonary disease, unspecified: Secondary | ICD-10-CM

## 2015-06-17 NOTE — Assessment & Plan Note (Signed)
Smoking cessation  

## 2015-06-17 NOTE — Progress Notes (Signed)
Subjective:    Patient ID: Edward Warren, male    DOB: 07-21-1955, 60 y.o.   MRN: [id number]  HPI 60 yo male smoker with COPD   TEST   Significant tests/ events PFTs - 12/2013 -FEV1 69%, ratio 63, DLCO 40 Spirometry in June 2014 showed an FEV1 of 2.57-68% with a ratio of 67, and FVC of 82% suggestive of moderate stage II COPD.  PFTs  11/2009 showed a DLCO of 64% and an FEV1 of 63% suggestive of moderate impairment.  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 Charlett Blake. CT chest without contrast in 09/2012 and in 06/2011 did not show any pleural or interstitial abnormality.    06/17/2015 Follow up : COPD  Pt returns for a 6 month follow up. Breathing is about the same, has good days and bad days.  Has some cough on/off , no discolored mucus .  Still smoking , discussed cessation.  Remains on BREO and Spiriva  Flu and PVX utd..      Past Medical History  Diagnosis Date  . COPD (chronic obstructive pulmonary disease) (HCC)     PFTS: 7-10: TEV1 ration of 62, FEV1 63% and FVC 80% (mod, class II)  . History of shingles   . Personal history of colonic adenomas 07/05/2012  . Hypertension   . Emphysema of lung ([place])   . Asbestosis [facility])     per patient dx by ct scan 2012   Current Outpatient Prescriptions on File Prior to Visit  Medication Sig Dispense Refill  . Aclidinium Bromide (TUDORZA PRESSAIR) 400 MCG/ACT AEPB Inhale 1 Inhaler into the lungs 2 (two) times daily. 1 each 3  . AMBULATORY NON FORMULARY MEDICATION Cane to use for ambulation 1 each 0  . Fluticasone Furoate-Vilanterol (BREO ELLIPTA) 100-25 MCG/INH AEPB Take 1 puff by mouth daily. 60 each 5  . HYDROcodone-acetaminophen (NORCO/VICODIN) 5-325 MG per tablet Take 1-2 tablets by mouth every 4 (four) hours as needed for severe pain. Take with food. 12 tablet 0  . meloxicam (MOBIC) 15 MG tablet Take 1 tablet (15 mg total) by mouth daily. 90 tablet 3  . Sodium Hyaluronate (SUPARTZ) 25 MG/2.5ML SOSY Inject 1 syringe into  the left knee weekly for 5 weeks 5 Syringe 0  . tiotropium (SPIRIVA HANDIHALER) 18 MCG inhalation capsule Place 1 capsule (18 mcg total) into inhaler and inhale daily. 30 capsule 5  . TRIBENZOR 40-5-25 MG TABS TAKE 1 TABLET BY MOUTH DAILY. 90 tablet 1   No current facility-administered medications on file prior to visit.      Review of Systems Constitutional:   No  weight loss, night sweats,  Fevers, chills, = fatigue, or  lassitude.  HEENT:   No headaches,  Difficulty swallowing,  Tooth/dental problems, or  Sore throat,                No sneezing, itching, ear ache, nasal congestion, post nasal drip,   CV:  No chest pain,  Orthopnea, PND, swelling in lower extremities, anasarca, dizziness, palpitations, syncope.   GI  No heartburn, indigestion, abdominal pain, nausea, vomiting, diarrhea, change in bowel habits, loss of appetite, bloody stools.   Resp:   No chest wall deformity  Skin: no rash or lesions.  GU: no dysuria, change in color of urine, no urgency or frequency.  No flank pain, no hematuria   MS:  No joint pain or swelling.  No decreased range of motion.  No back pain.  Psych:  No change in  mood or affect. No depression or anxiety.  No memory loss.         Objective:   Physical Exam   Filed Vitals:   06/17/15 1105  BP: 128/80  Pulse: 79  Temp: 97.9 F (36.6 C)  TempSrc: Oral  Height: 5\' 11"  (1.803 m)  Weight: 222 lb (100.699 kg)  SpO2: 98%   GEN: A/Ox3; pleasant , NAD,   HEENT:  [place]/AT,  EACs-clear, TMs-wnl, NOSE-clear, THROAT-clear, no lesions, no postnasal drip or exudate noted.   NECK:  Supple w/ fair ROM; no JVD; normal carotid impulses w/o bruits; no thyromegaly or nodules palpated; no lymphadenopathy.  RESP  Decreased BS in bases ; w/o, wheezes/ rales/ or rhonchi.no accessory muscle use, no dullness to percussion  CARD:  RRR, no m/r/g  , no peripheral edema, pulses intact, no cyanosis or clubbing.  GI:   Soft & nt; nml bowel sounds; no  organomegaly or masses detected.  Musco: Warm bil, no deformities or joint swelling noted.   Neuro: alert, no focal deficits noted.    Skin: Warm, no lesions or rashes       Assessment & Plan:

## 2015-06-17 NOTE — Patient Instructions (Signed)
Continue on current regimen .  Follow up with Dr. Alva  In 6 months and As needed   Work on not smoking  

## 2015-06-17 NOTE — Assessment & Plan Note (Signed)
Compensated on present regimen   Plan  Continue on current regimen .  Follow up with Dr. Elsworth Soho  In 6 months and As needed   Work on not smoking

## 2015-06-21 NOTE — Progress Notes (Signed)
Reviewed & agree with plan  

## 2015-07-08 ENCOUNTER — Ambulatory Visit (INDEPENDENT_AMBULATORY_CARE_PROVIDER_SITE_OTHER): Payer: 59 | Admitting: Family Medicine

## 2015-07-08 ENCOUNTER — Ambulatory Visit (INDEPENDENT_AMBULATORY_CARE_PROVIDER_SITE_OTHER): Payer: 59

## 2015-07-08 ENCOUNTER — Encounter: Payer: Self-pay | Admitting: Family Medicine

## 2015-07-08 VITALS — BP 133/80 | HR 94 | Wt 225.8 lb

## 2015-07-08 DIAGNOSIS — W458XXA Other foreign body or object entering through skin, initial encounter: Secondary | ICD-10-CM | POA: Diagnosis not present

## 2015-07-08 DIAGNOSIS — S61244A Puncture wound with foreign body of right ring finger without damage to nail, initial encounter: Secondary | ICD-10-CM

## 2015-07-08 DIAGNOSIS — M25441 Effusion, right hand: Secondary | ICD-10-CM

## 2015-07-08 MED ORDER — CEPHALEXIN 500 MG PO CAPS: 500.0000 mg | ORAL_CAPSULE | Freq: Three times a day (TID) | ORAL | Status: DC

## 2015-07-08 MED ORDER — HYDROCODONE-ACETAMINOPHEN 5-325 MG PO TABS: 1.0000 | ORAL_TABLET | Freq: Four times a day (QID) | ORAL | Status: DC | PRN

## 2015-07-08 NOTE — Progress Notes (Signed)
   Subjective:    Patient ID: Edward Warren, male    DOB: 22-Feb-1956, 60 y.o.   MRN: [id number]  HPI Says was lifting the edge of couch to move it onto the rug and felt a staple  puncture his finger. It has been very swollen since then and has been very tender. No drainage.    Review of Systems     Objective:   Physical Exam  Musculoskeletal:  Right hand, 4th finger between teh PIP and MTP he has a hard calloused nodule.    Psychiatric: He has a normal mood and affect. His behavior is normal.          Assessment & Plan:  Foreign body - patient tolerated well.  Metal staple approx 2 cm in length removed.    We discussed lancing the area and probing to see if we could find any metal in the finger. He agreed to the procedure. One about potential to damage surrounding areas including tendons etc.  Procedure for foreign body removal  The area was cleaned with Hibiclens. The area was anesthetized locally with 2 mL of 1% lidocaine without epinephrine. A #10 blade was used to lance across the calloused nodule. Forceps were used to probe the area. I was able to feel the metal inside. I was unable to remove with just the forceps and had to use hemostats to be able to grasp the metal enough to remove it. Once removed it was a bent staple approximately 2 cm in length. I did have him go down for x-ray just to confirm that there was no remaining foreign body. We discussed follow-up wound care. Troponin I ointment applied and gauze with tape and dressing on top. Because of the potential for infection I did go ahead and place him on Keflex for the next 7 days as well as gave him a small quantity of hydrocodone to use in case the pain worsened especially after the anesthesia wears off. He was able to fully flex his finger without any difficulty or pain immediately after the procedure.

## 2015-07-30 ENCOUNTER — Other Ambulatory Visit: Payer: Self-pay | Admitting: Sports Medicine

## 2015-10-05 ENCOUNTER — Other Ambulatory Visit: Payer: Self-pay | Admitting: Pulmonary Disease

## 2016-01-03 ENCOUNTER — Ambulatory Visit: Payer: Self-pay | Admitting: Family Medicine

## 2016-01-05 ENCOUNTER — Ambulatory Visit: Payer: Self-pay | Admitting: Family Medicine

## 2016-01-12 ENCOUNTER — Encounter: Payer: Self-pay | Admitting: Family Medicine

## 2016-01-12 ENCOUNTER — Other Ambulatory Visit: Payer: Self-pay | Admitting: Family Medicine

## 2016-01-12 ENCOUNTER — Ambulatory Visit (INDEPENDENT_AMBULATORY_CARE_PROVIDER_SITE_OTHER): Payer: 59 | Admitting: Family Medicine

## 2016-01-12 VITALS — BP 120/80 | HR 84 | Resp 15 | Ht 71.0 in | Wt 215.0 lb

## 2016-01-12 DIAGNOSIS — R1012 Left upper quadrant pain: Secondary | ICD-10-CM | POA: Diagnosis not present

## 2016-01-12 DIAGNOSIS — R1013 Epigastric pain: Secondary | ICD-10-CM | POA: Diagnosis not present

## 2016-01-12 DIAGNOSIS — Z23 Encounter for immunization: Secondary | ICD-10-CM | POA: Diagnosis not present

## 2016-01-12 LAB — CBC WITH DIFFERENTIAL/PLATELET
BASOS ABS: 41 {cells}/uL (ref 0–200)
BASOS PCT: 1 %
EOS ABS: 82 {cells}/uL (ref 15–500)
Eosinophils Relative: 2 %
HEMATOCRIT: 44.9 % (ref 38.5–50.0)
Hemoglobin: 15.3 g/dL (ref 13.2–17.1)
LYMPHS PCT: 37 %
Lymphs Abs: 1517 cells/uL (ref 850–3900)
MCH: 34.5 pg — AB (ref 27.0–33.0)
MCHC: 34.1 g/dL (ref 32.0–36.0)
MCV: 101.4 fL — AB (ref 80.0–100.0)
MONO ABS: 410 {cells}/uL (ref 200–950)
MONOS PCT: 10 %
MPV: 11.9 fL (ref 7.5–12.5)
Neutro Abs: 2050 cells/uL (ref 1500–7800)
Neutrophils Relative %: 50 %
PLATELETS: 199 10*3/uL (ref 140–400)
RBC: 4.43 MIL/uL (ref 4.20–5.80)
RDW: 14.1 % (ref 11.0–15.0)
WBC: 4.1 10*3/uL (ref 3.8–10.8)

## 2016-01-12 LAB — COMPLETE METABOLIC PANEL WITH GFR
ALT: 16 U/L (ref 9–46)
AST: 25 U/L (ref 10–35)
Albumin: 4.1 g/dL (ref 3.6–5.1)
Alkaline Phosphatase: 62 U/L (ref 40–115)
BILIRUBIN TOTAL: 0.7 mg/dL (ref 0.2–1.2)
BUN: 16 mg/dL (ref 7–25)
CALCIUM: 9.6 mg/dL (ref 8.6–10.3)
CO2: 21 mmol/L (ref 20–31)
CREATININE: 1.08 mg/dL (ref 0.70–1.25)
Chloride: 106 mmol/L (ref 98–110)
GFR, EST AFRICAN AMERICAN: 85 mL/min (ref >=60)
GFR, EST NON AFRICAN AMERICAN: 73 mL/min (ref >=60)
Glucose, Bld: 101 mg/dL — ABNORMAL HIGH (ref 65–99)
Potassium: 5.2 mmol/L (ref 3.5–5.3)
Sodium: 136 mmol/L (ref 135–146)
TOTAL PROTEIN: 6.9 g/dL (ref 6.1–8.1)

## 2016-01-12 LAB — AMYLASE: AMYLASE: 63 U/L (ref 0–105)

## 2016-01-12 LAB — LIPASE: LIPASE: 34 U/L (ref 7–60)

## 2016-01-12 MED ORDER — OMEPRAZOLE 40 MG PO CPDR: 40.0000 mg | DELAYED_RELEASE_CAPSULE | Freq: Every day | ORAL | 1 refills | Status: DC

## 2016-01-12 NOTE — Patient Instructions (Addendum)

## 2016-01-12 NOTE — Progress Notes (Addendum)
Subjective:    CC: Abd Pain  HPI:  Will get epigastric stomach cramping after eating certain foods and drinking alcohol. Avoiding spicy foods.  No diarrhea. No nausea or vomiting. He has had a little bit of bright red blood in the stool. He occasionally takes Pepto-Bismol but has not tried any antacids or acid blockers. He says the symptoms typically start about 30 minutes after eating. Particular triggers include alcohol and eggs.No real alleviating factors. He mostly points to the epigastric area above the bellybutton.  BP 120/80 (BP Location: Left Arm, Patient Position: Sitting, Cuff Size: Normal)   Pulse 84   Resp 15   Ht 5\' 11"  (1.803 m)   Wt 215 lb (97.5 kg)   SpO2 100%   BMI 29.99 kg/m     Allergies  Allergen Reactions  . Prednisone Other (See Comments)    Nightmares and suicidal thoughts    Past Medical History:  Diagnosis Date  . 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 COPD (chronic obstructive pulmonary disease) (HCC)    PFTS: 7-10: TEV1 ration of 62, FEV1 63% and FVC 80% (mod, class II)  . Emphysema of lung ([place])   . History of shingles   . Hypertension   . Personal history of colonic adenomas 07/05/2012    Past Surgical History:  Procedure Laterality Date  . COLONOSCOPY  multiple    Social History   Social History  . Marital status: Married    Spouse name: N/A  . Number of children: 1  . Years of education: N/A   Occupational History  . disabled     [employer]   Social History Main Topics  . Smoking status: Current Every Day Smoker    Packs/day: 0.10    Years: 42.00    Types: Cigarettes  . Smokeless tobacco: Never Used     Comment: Smokes 3-4 cigarettes a week 12/8  . Alcohol use 3.0 oz/week    5 Standard drinks or equivalent per week  . Drug use: No  . Sexual activity: Not on file   Other Topics Concern  . Not on file   Social History Narrative   No regular exercise.     Family History  Problem Relation Age of Onset   . Stroke Maternal Grandfather   . Breast cancer Maternal Aunt   . Cancer Maternal Uncle     throat  . Colon cancer Neg Hx   . COPD Mother   . Heart attack Father     Outpatient Encounter Prescriptions as of 01/12/2016  Medication Sig  . Aclidinium Bromide (TUDORZA PRESSAIR) 400 MCG/ACT AEPB Inhale 1 Inhaler into the lungs 2 (two) times daily.  . AMBULATORY NON FORMULARY MEDICATION Cane to use for ambulation  . BREO ELLIPTA 100-25 MCG/INH AEPB TAKE 1 PUFF BY MOUTH DAILY.  . meloxicam (MOBIC) 15 MG tablet TAKE 1 TABLET (15 MG TOTAL) BY MOUTH DAILY.  [name] Kitchen tiotropium (SPIRIVA HANDIHALER) 18 MCG inhalation capsule Place 1 capsule (18 mcg total) into inhaler and inhale daily.  Jabier Gauss 40-5-25 MG TABS TAKE 1 TABLET BY MOUTH DAILY.  [name] Kitchen omeprazole (PRILOSEC) 40 MG capsule Take 1 capsule (40 mg total) by mouth daily. Take 30 min before first meal of day.  . [DISCONTINUED] cephALEXin (KEFLEX) 500 MG capsule Take 1 capsule (500 mg total) by mouth 3 (three) times daily.  . [DISCONTINUED] HYDROcodone-acetaminophen (NORCO/VICODIN) 5-325 MG per tablet Take 1-2 tablets by mouth every 4 (four) hours as  needed for severe pain. Take with food.  . [DISCONTINUED] HYDROcodone-acetaminophen (NORCO/VICODIN) 5-325 MG tablet Take 1 tablet by mouth every 6 (six) hours as needed for moderate pain.  . [DISCONTINUED] Sodium Hyaluronate (SUPARTZ) 25 MG/2.5ML SOSY Inject 1 syringe into the left knee weekly for 5 weeks   No facility-administered encounter medications on file as of 01/12/2016.     Review of Systems: No fevers, chills, night sweats, weight loss, chest pain, or shortness of breath.   Objective:    General: Well Developed, well nourished, and in no acute distress.  Neuro: Alert and oriented x3, extra-ocular muscles intact, sensation grossly intact.  HEENT: Normocephalic, atraumatic  Skin: Warm and dry, no rashes. Cardiac: Regular rate and rhythm, no murmurs rubs or gallops, no lower extremity edema.   Respiratory: Clear to auscultation bilaterally. Not using accessory muscles, speaking in full sentences.   Impression and Recommendations:    Epigastric pain/left upper quadrant pain- Gastritis versus gastric ulcer versus pancreatitis. we'll evaluate for elevated liver enzymes and pancreatitis. We'll check him for H. pylori. He is fasting today and has not been on any antacids. We'll go ahead and start him on a PPI. reflux dietary measures.  Specically encouraged him to avoid alcohol.

## 2016-01-13 LAB — H. PYLORI BREATH TEST: H. pylori Breath Test: NOT DETECTED

## 2016-01-17 ENCOUNTER — Ambulatory Visit: Payer: Self-pay | Admitting: Sports Medicine

## 2016-01-19 ENCOUNTER — Telehealth: Payer: Self-pay | Admitting: *Deleted

## 2016-01-19 NOTE — Telephone Encounter (Signed)
Pt reports that after receiving the flu shot on 01/12/16 he began experiencing sxs by Friday. He stated he was in bed and feeling very fatigued has taking mucinex and has been coughing up some thick white mucus. Feels like he has been running a low grade fever wanted to know if something could be called in to his pharmacy for this. Will fwd to pcp for advice.Audelia Hives [place]

## 2016-01-20 ENCOUNTER — Ambulatory Visit (INDEPENDENT_AMBULATORY_CARE_PROVIDER_SITE_OTHER): Payer: 59 | Admitting: Pulmonary Disease

## 2016-01-20 ENCOUNTER — Encounter: Payer: Self-pay | Admitting: Pulmonary Disease

## 2016-01-20 VITALS — BP 114/80 | HR 86 | Ht 71.0 in | Wt 218.0 lb

## 2016-01-20 DIAGNOSIS — J441 Chronic obstructive pulmonary disease with (acute) exacerbation: Secondary | ICD-10-CM

## 2016-01-20 DIAGNOSIS — F17200 Nicotine dependence, unspecified, uncomplicated: Secondary | ICD-10-CM | POA: Diagnosis not present

## 2016-01-20 MED ORDER — AZITHROMYCIN 250 MG PO TABS: ORAL_TABLET | ORAL | 0 refills | Status: DC

## 2016-01-20 NOTE — Assessment & Plan Note (Signed)
Treat as flare  Since you are allergic to prednisone, we will give Unasyn antibiotic- Zpak Use albuterol inhaler 2-3 times daily until wheezing subsides  Stay on Breo and Spiriva

## 2016-01-20 NOTE — Telephone Encounter (Signed)
Needs appt to have chest checked. This is not from the flu shot.

## 2016-01-20 NOTE — Assessment & Plan Note (Signed)
Counseled about smoking cessation 

## 2016-01-20 NOTE — Telephone Encounter (Signed)
Pt notified of recommendation. He stated that he will see his pulmonlogist today and make an appointment if they do not help with the problem.

## 2016-01-20 NOTE — Progress Notes (Signed)
   Subjective:    Patient ID: Edward Warren, male    DOB: 1956-01-05, 60 y.o.   MRN: [id number]  HPI  60 year old smoker for FU of COPD- gold B.  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 He smoked about a pack per day since his teenage years-about 40 pack years He also goes to the [place] at [facility]    01/20/2016  Chief Complaint  Patient presents with  . Follow-up    Took flu shot, started getting sick, chest/ head congestion, chest tightness.     He took the flu shot 2 weeks ago and since then he states that he has been having a chest cold-cough productive of white to green mucus, he also reports a head cold with nasal discharge. Breathing has been worse with intermittent wheezing He reports allergy to prednisone in the form of suicidal thoughts  He went on short-term disability in 2015, then retired Completed pulm rehab   He is maintained on a regimen of Spiriva and Breo.    Significant tests/ events   PFTs - 12/2013 -FEV1 69%, ratio 63, DLCO 40 Spirometry in June 2014 showed an FEV1 of 2.57-68% with a ratio of 67, and FVC of 82% suggestive of moderate stage II COPD.  PFTs  11/2009 showed a DLCO of 64% and an FEV1 of 63% suggestive of moderate impairment.  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 Charlett Blake. CT chest without contrast in 09/2012 and in 06/2011 did not show any pleural or interstitial abnormality.   Meds- Chantix -caused dreams and made him depressed .  Prednisone caused him suicidal thoughts   echo stress test in 2013 that showed mitral stenosis  Review of Systems neg for any significant sore throat, dysphagia, itching, sneezing,  fever, chills, sweats, unintended wt loss, pleuritic or exertional cp, hempoptysis, orthopnea pnd or change in chronic leg swelling.   Also denies presyncope, palpitations, heartburn, abdominal pain, nausea, vomiting, diarrhea or change in bowel or urinary habits,  dysuria,hematuria, rash, arthralgias, visual complaints, headache, numbness weakness or ataxia.     Objective:   Physical Exam  Gen. Pleasant, well-nourished, in no distress ENT - no lesions, no post nasal drip Neck: No JVD, no thyromegaly, no carotid bruits Lungs: no use of accessory muscles, no dullness to percussion, fine scattered rhonchi  Cardiovascular: Rhythm regular, heart sounds  normal, no murmurs or gallops, no peripheral edema Musculoskeletal: No deformities, no cyanosis or clubbing        Assessment & Plan:

## 2016-01-20 NOTE — Patient Instructions (Signed)
Since you are allergic to prednisone, we will give Unasyn antibiotic- Zpak Use albuterol inhaler 2-3 times daily until wheezing subsides  Stay on Breo and Spiriva

## 2016-01-21 ENCOUNTER — Ambulatory Visit (INDEPENDENT_AMBULATORY_CARE_PROVIDER_SITE_OTHER): Payer: 59 | Admitting: Sports Medicine

## 2016-01-21 DIAGNOSIS — M1712 Unilateral primary osteoarthritis, left knee: Secondary | ICD-10-CM | POA: Diagnosis not present

## 2016-01-21 NOTE — Progress Notes (Signed)
  Subjective:    CC: Left knee pain  HPI: This is a pleasant 60 year old male, it's been months since he's had his last knee injection, he has a recurrence of pain, moderate, persistent, localized at the joint line at the left knee, no radiation, no mechanical symptoms.  Past medical history, Surgical history, Family history not pertinant except as noted below, Social history, Allergies, and medications have been entered into the medical record, reviewed, and no changes needed.   Review of Systems: No fevers, chills, night sweats, weight loss, chest pain, or shortness of breath.   Objective:    General: Well Developed, well nourished, and in no acute distress.  Neuro: Alert and oriented x3, extra-ocular muscles intact, sensation grossly intact.  HEENT: Normocephalic, atraumatic, pupils equal round reactive to light, neck supple, no masses, no lymphadenopathy, thyroid nonpalpable.  Skin: Warm and dry, no rashes. Cardiac: Regular rate and rhythm, no murmurs rubs or gallops, no lower extremity edema.  Respiratory: Clear to auscultation bilaterally. Not using accessory muscles, speaking in full sentences. Left Knee: Normal to inspection with no erythema or effusion or obvious bony abnormalities. Tender to palpation at the medial joint line ROM normal in flexion and extension and lower leg rotation. Ligaments with solid consistent endpoints including ACL, PCL, LCL, MCL. Negative Mcmurray's and provocative meniscal tests. Non painful patellar compression. Patellar and quadriceps tendons unremarkable. Hamstring and quadriceps strength is normal.  Procedure: Real-time Ultrasound Guided Injection of left knee Device: GE Logiq E  Verbal informed consent obtained.  Time-out conducted.  Noted no overlying erythema, induration, or other signs of local infection.  Skin prepped in a sterile fashion.  Local anesthesia: Topical Ethyl chloride.  With sterile technique and under real time ultrasound  guidance:  1 mL Kenalog 40, 2 mL lidocaine, 2 mL Marcaine injected easily. Completed without difficulty  Pain immediately resolved suggesting accurate placement of the medication.  Advised to call if fevers/chills, erythema, induration, drainage, or persistent bleeding.  Images permanently stored and available for review in the ultrasound unit.  Impression: Technically successful ultrasound guided injection.  Impression and Recommendations:    Osteoarthritis of left knee Injection as above, physical therapy, return as needed.  I spent 25 minutes with this patient, greater than 50% was face-to-face time counseling regarding the above diagnoses, this was separate from the time spent performing the procedure

## 2016-01-21 NOTE — Assessment & Plan Note (Signed)
Injection as above, physical therapy, return as needed.

## 2016-02-02 ENCOUNTER — Ambulatory Visit: Payer: 59 | Admitting: Rehabilitative and Restorative Service Providers"

## 2016-02-09 ENCOUNTER — Encounter: Payer: Self-pay | Admitting: Physical Therapy

## 2016-02-09 ENCOUNTER — Ambulatory Visit (INDEPENDENT_AMBULATORY_CARE_PROVIDER_SITE_OTHER): Payer: 59 | Admitting: Physical Therapy

## 2016-02-09 DIAGNOSIS — R262 Difficulty in walking, not elsewhere classified: Secondary | ICD-10-CM

## 2016-02-09 DIAGNOSIS — M6281 Muscle weakness (generalized): Secondary | ICD-10-CM

## 2016-02-09 DIAGNOSIS — M25562 Pain in left knee: Secondary | ICD-10-CM

## 2016-02-09 NOTE — Therapy (Signed)
[place], [place], [address] Phone: [phone]   Fax:  [phone]  Physical Therapy Evaluation  Patient Details  Name: Edward Warren MRN: [id number] Date of Birth: 03-11-1956 Referring Provider: Dr Dianah Field  Encounter Date: 02/09/2016      PT End of Session - 02/09/16 0929    Visit Number 1   Number of Visits 6   Date for PT Re-Evaluation 03/01/16   PT Start Time 0852   PT Stop Time 0930   PT Time Calculation (min) 38 min   Activity Tolerance Patient tolerated treatment well      Past Medical History:  Diagnosis Date  . Asbestosis ([place])    per patient dx by ct scan 2012  . COPD (chronic obstructive pulmonary disease) (HCC)    PFTS: 7-10: TEV1 ration of 62, FEV1 63% and FVC 80% (mod, class II)  . Emphysema of lung ([place])   . History of shingles   . Hypertension   . Personal history of colonic adenomas 07/05/2012    Past Surgical History:  Procedure Laterality Date  . COLONOSCOPY  multiple    There were no vitals filed for this visit.       Subjective Assessment - 02/09/16 0855    Subjective Pt reports he noticed Lt knee swelling about a yr ago, and he'd see the MD and have fluid drawn off every 3-4 months. He had an injection last week for the pain and MD suggested he try PT.  He states the knee will give at least 3-4 times a day, no falling however it gives some with pain.    How long can you sit comfortably? no limitations   How long can you walk comfortably? tolerates ~  30 minutes   Patient Stated Goals what ever we can do to loosen up the knee, go to the gym   Currently in Pain? Yes   Pain Score 4    Pain Location Knee   Pain Orientation Left   Pain Descriptors / Indicators Throbbing   Pain Type Chronic pain   Pain Onset More than a month ago   Pain Frequency Constant   Aggravating Factors  standing prolonged, stairs, squating, sometimes walking   Pain Relieving  Factors nothing            [facility] PT Assessment - 02/09/16 0001      Assessment   Medical Diagnosis OA Lt knee   Referring Provider Dr Dianah Field   Onset Date/Surgical Date 02/09/15   Hand Dominance Right   Next MD Visit not schedule   Prior Therapy none     Precautions   Precautions None   Required Braces or Orthoses --  will use ace wrap PRN      Balance Screen   Has the patient fallen in the past 6 months No   Has the patient had a decrease in activity level because of a fear of falling?  No   Is the patient reluctant to leave their home because of a fear of falling?  No     Home Environment   Living Environment Private residence   Living Arrangements Spouse/significant other   Home Layout Two level  alternates, has pain and has to go slower.      Prior Function   Level of Independence Independent   Vocation Retired   Leisure yard work when pain is ok, watch TV     Observation/Other Assessments   Focus on  Therapeutic Outcomes (FOTO)  61% limited     Functional Tests   Functional tests Squat;Single leg stance     Squat   Comments shift to Rt      Single Leg Stance   Comments Rt > 12 sec, Lt > 12 sec with pain, increased accessory ankle motion bilat.      Posture/Postural Control   Posture/Postural Control Postural limitations   Postural Limitations Weight shift right  due to pain     ROM / Strength   AROM / PROM / Strength AROM;Strength     AROM   AROM Assessment Site Knee   Right/Left Knee --  full ext bilat, flex Rt 136, Lt 130     Strength   Strength Assessment Site Hip;Knee;Ankle   Right/Left Hip Left   Left Hip Flexion 4+/5   Left Hip Extension 4/5  Rt WNL   Left Hip ABduction 4/5   Right/Left Knee Left  Rt WNL   Left Knee Flexion 4+/5  some pain   Left Knee Extension 4/5  some pain, fair eccentric quad strength    Right/Left Ankle --  bilat WNL except Lt plantarflexion 4/5     Flexibility   Soft Tissue Assessment /Muscle Length  yes   Hamstrings supine Rt 40, Lt 58   Quadriceps prone, Rt 124, Lt 108 ( knee joint pain )      Palpation   Patella mobility good    Palpation comment tenderness around the Lt knee                   [facility] Adult PT Treatment/Exercise - 02/09/16 0001      Exercises   Exercises Knee/Hip     Knee/Hip Exercises: Stretches   Quad Stretch Left;1 rep;30 seconds  prone with strap     Knee/Hip Exercises: Standing   Heel Raises Left;3 sets;10 reps     Knee/Hip Exercises: Supine   Straight Leg Raise with External Rotation Strengthening;Left;3 sets;10 reps     Knee/Hip Exercises: Sidelying   Hip ABduction Strengthening;Left;3 sets;10 reps  VC for form     Knee/Hip Exercises: Prone   Hip Extension Strengthening;Left;3 sets;10 reps                PT Education - 02/09/16 0924    Education provided Yes   Education Details HEP    Person(s) Educated Patient   Methods Explanation;Demonstration;Handout   Comprehension Returned demonstration;Verbalized understanding             PT Long Term Goals - 02/09/16 1057      PT LONG TERM GOAL #1   Title I with HEP to include gym workouts ( 03/01/16)    Time 3   Period Weeks   Status New     PT LONG TERM GOAL #2   Title increase Lt LE strength =/> 5-/5 ( 03/01/16)    Time 3   Period Weeks   Status New     PT LONG TERM GOAL #3   Title report =/> 50% reduction in Lt knee pain with walking ( 03/01/16)    Time 3   Period Weeks   Status New     PT LONG TERM GOAL #4   Title improve FOTO =/< 45% limited ( 03/01/16)    Time 3   Period Weeks   Status New               Plan - 02/09/16 1055    Clinical Impression Statement  49 you male presents with long h/o Lt knee pain, he has had multiple visits to MD to have fluid drawn off the knee over the last year and just recently had an injection. He has weakness in the Lt hip/knee and ankle.  ROM is limited however not sure if it is due to knee pain with flexion  or tight muscles.  He wishes to start walking program and exercise at the gym.    Rehab Potential Excellent   PT Frequency 2x / week   PT Duration 3 weeks   PT Treatment/Interventions Moist Heat;Ultrasound;Therapeutic exercise;Dry needling;Taping;Manual techniques;Neuromuscular re-education;Gait training;Cryotherapy;Electrical Stimulation;Patient/family education   PT Next Visit Plan progress Lt LE strengthening   Consulted and Agree with Plan of Care Patient      Patient will benefit from skilled therapeutic intervention in order to improve the following deficits and impairments:  Pain, Decreased strength, Decreased range of motion, Difficulty walking  Visit Diagnosis: Pain in left knee - Plan: PT plan of care cert/re-cert  Muscle weakness (generalized) - Plan: PT plan of care cert/re-cert  Difficulty in walking, not elsewhere classified - Plan: PT plan of care cert/re-cert     Problem List Patient Active Problem List   Diagnosis Date Noted  . Osteoarthritis of left knee 02/10/2014  . COPD (chronic obstructive pulmonary disease) ([place]) 10/23/2012  . Personal history of colonic adenomas 07/05/2012  . Severe mitral valve stenosis 07/11/2011  . Diastolic dysfunction [id number]  . HYPOGONADISM 07/01/2010  . MEIBOMIAN CYST 12/08/2009  . TOBACCO ABUSE 10/26/2009  . ALCOHOL ABUSE 02/08/2009  . H/O asbestos exposure 02/08/2009  . GERD 09/09/2008  . HYPERTENSION, BENIGN 08/05/2008    Jeral Pinch PT  02/09/2016, 11:17 AM  [facility] [place], [place], [address] Phone: [phone]   Fax:  [phone]  Name: QUINZELL FOLLETT MRN: [id number] Date of Birth: July 18, 1955

## 2016-02-09 NOTE — Patient Instructions (Addendum)
Straight Leg Raise: With External Leg Rotation  [place] [phone]    Lie on back with left leg straight, opposite leg bent. Rotate straight leg out and lift __10-12__ inches. Repeat __10__ times per set. Do __3__ sets per session. Do _1___ sessions per day.  Strengthening: Hip Abduction (Side-Lying) - don't let hip rock backwards and keep top leg back also     Tighten muscles on front of left thigh, then lift leg __10-12__ inches from surface, keeping knee locked.  Repeat _10___ times per set. Do __3__ sets per session. Do _1___ sessions per day.  Strengthening: Hip Extension (Prone)    Tighten muscles on front of left thigh, then lift leg _8-10___ inches from surface, keeping knee locked. Repeat __10__ times per set. Do __3__ sets per session. Do __1__ sessions per day.  Heel Raise: Unilateral (Standing) - can do next to a wall or counter for balance    Balance on left foot, then rise on ball of foot. Repeat __8-10__ times per set. Do __3__ sets per session. Do ___1_ sessions per day.  Quads / HF, Prone    Lie face down, knees together. Grasp one ankle with same-side hand. Use towel if needed to reach. Gently pull foot toward buttock. Hold _30__ seconds. Repeat __1_ times per session. Do _1__ sessions per day.  Copyright  VHI. All rights reserved.

## 2016-02-14 ENCOUNTER — Encounter: Payer: Self-pay | Admitting: Physical Therapy

## 2016-02-16 ENCOUNTER — Encounter: Payer: Self-pay | Admitting: Physical Therapy

## 2016-02-17 LAB — PULMONARY FUNCTION TEST

## 2016-02-22 ENCOUNTER — Ambulatory Visit (INDEPENDENT_AMBULATORY_CARE_PROVIDER_SITE_OTHER): Payer: 59 | Admitting: Physical Therapy

## 2016-02-22 DIAGNOSIS — M6281 Muscle weakness (generalized): Secondary | ICD-10-CM

## 2016-02-22 DIAGNOSIS — G8929 Other chronic pain: Secondary | ICD-10-CM

## 2016-02-22 DIAGNOSIS — M25562 Pain in left knee: Secondary | ICD-10-CM

## 2016-02-22 DIAGNOSIS — R262 Difficulty in walking, not elsewhere classified: Secondary | ICD-10-CM

## 2016-02-22 NOTE — Therapy (Signed)
[place], [place], [address] Phone: [phone]   Fax:  [phone]  Physical Therapy Treatment  Patient Details  Name: Edward Warren MRN: [id number] Date of Birth: 08/30/1955 Referring Provider: Dr. Helane Rima  Encounter Date: 02/22/2016      PT End of Session - 02/22/16 0901    Visit Number 2   Number of Visits 6   Date for PT Re-Evaluation 03/01/16   PT Start Time [id number]  pt arrived late   PT Stop Time 0928   PT Time Calculation (min) 34 min   Activity Tolerance Patient tolerated treatment well;No increased pain   Behavior During Therapy WFL for tasks assessed/performed      Past Medical History:  Diagnosis Date  . 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 COPD (chronic obstructive pulmonary disease) (HCC)    PFTS: 7-10: TEV1 ration of 62, FEV1 63% and FVC 80% (mod, class II)  . Emphysema of lung ([place])   . History of shingles   . Hypertension   . Personal history of colonic adenomas 07/05/2012    Past Surgical History:  Procedure Laterality Date  . COLONOSCOPY  multiple    There were no vitals filed for this visit.      Subjective Assessment - 02/22/16 0858    Subjective Pt was out of town for death in family.  He was unable to complete HEP due to lack of time.  He has done a lot of walking.  His main complaint is weakness and a soreness.    Currently in Pain? Yes   Pain Score 2    Pain Location Knee   Pain Orientation Left   Pain Descriptors / Indicators Sore   Aggravating Factors  stairs and squatting    Pain Relieving Factors exercise to loosen it up.             [facility] PT Assessment - 02/22/16 0001      Assessment   Medical Diagnosis OA Lt knee   Referring Provider Dr. Helane Rima   Onset Date/Surgical Date 02/09/15   Hand Dominance Right   Next MD Visit not schedule     Flexibility   Hamstrings Supine, Rt 80 deg, Lt 65   Quadriceps Lt 119  deg.           [facility] Adult PT Treatment/Exercise - 02/22/16 0001      Knee/Hip Exercises: Stretches   Passive Hamstring Stretch 30 seconds;Left;3 reps  RLE 1 rep x 45 sec    Quad Stretch Left;4 reps;30 seconds  1 rep on RLE x 45 sec   Piriformis Stretch Left;2 reps;30 seconds     Knee/Hip Exercises: Aerobic   Recumbent Bike L1: 5 min      Knee/Hip Exercises: Standing   Heel Raises Both;2 sets;10 reps     Knee/Hip Exercises: Supine   Straight Leg Raise with External Rotation Strengthening;Left;2 sets;10 reps  eccentric focus     Knee/Hip Exercises: Sidelying   Hip ABduction Left;2 sets;10 reps;Strengthening     Knee/Hip Exercises: Prone   Hip Extension Strengthening;2 sets;10 reps           PT Long Term Goals - 02/22/16 0930      PT LONG TERM GOAL #1   Title I with HEP to include gym workouts ( 03/01/16)    Time 3   Period Weeks   Status On-going     PT LONG TERM GOAL #  2   Title increase Lt LE strength =/> 5-/5 ( 03/01/16)    Time 3   Period Weeks   Status On-going     PT LONG TERM GOAL #3   Title report =/> 50% reduction in Lt knee pain with walking ( 03/01/16)    Time 3   Period Weeks   Status On-going     PT LONG TERM GOAL #4   Title improve FOTO =/< 45% limited ( 03/01/16)    Time 3   Period Weeks   Status On-going               Plan - 02/22/16 0931    Clinical Impression Statement Pt tolerated all exercises well, without any increase in pain - just fatigue.  Pt's flexibility has improved since eval. Pt is motivated to progress toward established goals and is progressing.    Rehab Potential Excellent   PT Frequency 2x / week   PT Duration 3 weeks   PT Treatment/Interventions Moist Heat;Ultrasound;Therapeutic exercise;Dry needling;Taping;Manual techniques;Neuromuscular re-education;Gait training;Cryotherapy;Electrical Stimulation;Patient/family education   PT Next Visit Plan progress Lt LE strengthening, add pool exercises to HEP.     Consulted and Agree with Plan of Care Patient      Patient will benefit from skilled therapeutic intervention in order to improve the following deficits and impairments:  Pain, Decreased strength, Decreased range of motion, Difficulty walking  Visit Diagnosis: Chronic pain of left knee  Muscle weakness (generalized)  Difficulty in walking, not elsewhere classified     Problem List Patient Active Problem List   Diagnosis Date Noted  . Osteoarthritis of left knee 02/10/2014  . COPD (chronic obstructive pulmonary disease) ([place]) 10/23/2012  . Personal history of colonic adenomas 07/05/2012  . Severe mitral valve stenosis 07/11/2011  . Diastolic dysfunction [id number]  . HYPOGONADISM 07/01/2010  . MEIBOMIAN CYST 12/08/2009  . TOBACCO ABUSE 10/26/2009  . ALCOHOL ABUSE 02/08/2009  . H/O asbestos exposure 02/08/2009  . GERD 09/09/2008  . HYPERTENSION, BENIGN 08/05/2008   [name], PTA 02/22/16 1:18 PM  [facility] [place], [place], [address] Phone: [phone]   Fax:  [phone]  Name: Edward Warren MRN: [id number] Date of Birth: 16-Aug-1955

## 2016-02-22 NOTE — Patient Instructions (Signed)
Calf Stretch    Place hands on wall at shoulder height. Keeping back leg straight, bend front leg, feet pointing forward, heels flat on floor. Lean forward slightly until stretch is felt in calf of back leg. Hold stretch _30__ seconds, breathing slowly in and out. Repeat stretch with other leg back. Do _2__ sessions per day. Variation: Use chair or table for support.

## 2016-02-24 ENCOUNTER — Ambulatory Visit (INDEPENDENT_AMBULATORY_CARE_PROVIDER_SITE_OTHER): Payer: 59 | Admitting: Physical Therapy

## 2016-02-24 DIAGNOSIS — R262 Difficulty in walking, not elsewhere classified: Secondary | ICD-10-CM

## 2016-02-24 DIAGNOSIS — G8929 Other chronic pain: Secondary | ICD-10-CM | POA: Diagnosis not present

## 2016-02-24 DIAGNOSIS — M25562 Pain in left knee: Secondary | ICD-10-CM | POA: Diagnosis not present

## 2016-02-24 DIAGNOSIS — M6281 Muscle weakness (generalized): Secondary | ICD-10-CM | POA: Diagnosis not present

## 2016-02-24 NOTE — Therapy (Signed)
[place], [place], [address] Phone: [phone]   Fax:  [phone]  Physical Therapy Treatment  Patient Details  Name: Edward Warren MRN: [id number] Date of Birth: 1955-11-21 Referring Provider: Dr. Helane Rima  Encounter Date: 02/24/2016      PT End of Session - 02/24/16 0858    Visit Number 3   Number of Visits 6   Date for PT Re-Evaluation 03/01/16   PT Start Time 0848   PT Stop Time 0931   PT Time Calculation (min) 43 min   Activity Tolerance Patient tolerated treatment well;No increased pain   Behavior During Therapy WFL for tasks assessed/performed      Past Medical History:  Diagnosis Date  . Asbestosis ([place])    per patient dx by ct scan 2012  . COPD (chronic obstructive pulmonary disease) (HCC)    PFTS: 7-10: TEV1 ration of 62, FEV1 63% and FVC 80% (mod, class II)  . Emphysema of lung ([place])   . History of shingles   . Hypertension   . Personal history of colonic adenomas 07/05/2012    Past Surgical History:  Procedure Laterality Date  . COLONOSCOPY  multiple    There were no vitals filed for this visit.      Subjective Assessment - 02/24/16 0931    Subjective Edward Warren states his Lt knee is feeling really loos now, but he can still feel something flapping around on inside of knee.  He did exercises yesterday, but hopes to return to gym soon.     Currently in Pain? No/denies            [facility] PT Assessment - 02/24/16 0001      Assessment   Medical Diagnosis OA Lt knee   Referring Provider Dr. Helane Rima   Onset Date/Surgical Date 02/09/15   Hand Dominance Right   Next MD Visit not scheduled           [facility] Adult PT Treatment/Exercise - 02/24/16 0001      Knee/Hip Exercises: Stretches   Passive Hamstring Stretch Right;Left;2 reps;30 seconds  standing    Quad Stretch Left;4 reps;30 seconds  Right, 3 reps 30 sec   Piriformis Stretch Right;Left;2 reps;30  seconds  seated   Gastroc Stretch Right;Left;2 reps;30 seconds     Knee/Hip Exercises: Aerobic   Recumbent Bike L3: 6.5 min      Knee/Hip Exercises: Machines for Strengthening   Cybex Knee Extension single leg 2 plates x 5 reps with LLE only, then BLE to ext and LLE to eccentric flex.   Then 10 reps with BLE to ext, LLE to flex.    Cybex Leg Press LLE only x 5 plates x 10 reps     Knee/Hip Exercises: Standing   Other Standing Knee Exercises split squats x 5 reps x 4 sets (mini).   Squats x 8 reps (VC for form)   Other Standing Knee Exercises Hip abd with yellow band x 2 sets of 10 reps.             PT Long Term Goals - 02/22/16 0930      PT LONG TERM GOAL #1   Title I with HEP to include gym workouts ( 03/01/16)    Time 3   Period Weeks   Status On-going     PT LONG TERM GOAL #2   Title increase Lt LE strength =/> 5-/5 ( 03/01/16)    Time 3   Period Weeks  Status On-going     PT LONG TERM GOAL #3   Title report =/> 50% reduction in Lt knee pain with walking ( 03/01/16)    Time 3   Period Weeks   Status On-going     PT LONG TERM GOAL #4   Title improve FOTO =/< 45% limited ( 03/01/16)    Time 3   Period Weeks   Status On-going               Plan - 02/24/16 0932    Clinical Impression Statement Pt tolerated all exercises well, without increase in pain.  Minor cues on form.  Incorporated equip exercises for improving return to gym program.  Pt progressing towards goals.    Rehab Potential Excellent   PT Frequency 2x / week   PT Duration 3 weeks   PT Treatment/Interventions Moist Heat;Ultrasound;Therapeutic exercise;Dry needling;Taping;Manual techniques;Neuromuscular re-education;Gait training;Cryotherapy;Electrical Stimulation;Patient/family education   PT Next Visit Plan progress Lt LE strengthening, add pool exercises to HEP.    Consulted and Agree with Plan of Care Patient      Patient will benefit from skilled therapeutic intervention in order to  improve the following deficits and impairments:  Pain, Decreased strength, Decreased range of motion, Difficulty walking  Visit Diagnosis: Chronic pain of left knee  Muscle weakness (generalized)  Difficulty in walking, not elsewhere classified     Problem List Patient Active Problem List   Diagnosis Date Noted  . Osteoarthritis of left knee 02/10/2014  . COPD (chronic obstructive pulmonary disease) ([place]) 10/23/2012  . Personal history of colonic adenomas 07/05/2012  . Severe mitral valve stenosis 07/11/2011  . Diastolic dysfunction [id number]  . HYPOGONADISM 07/01/2010  . MEIBOMIAN CYST 12/08/2009  . TOBACCO ABUSE 10/26/2009  . ALCOHOL ABUSE 02/08/2009  . H/O asbestos exposure 02/08/2009  . GERD 09/09/2008  . HYPERTENSION, BENIGN 08/05/2008   [name], PTA 02/24/16 1:25 PM  [facility] [place], [place], [address] Phone: [phone]   Fax:  [phone]  Name: Edward Warren MRN: [id number] Date of Birth: 04-30-1956

## 2016-02-29 ENCOUNTER — Ambulatory Visit (INDEPENDENT_AMBULATORY_CARE_PROVIDER_SITE_OTHER): Payer: 59 | Admitting: Physical Therapy

## 2016-02-29 DIAGNOSIS — G8929 Other chronic pain: Secondary | ICD-10-CM | POA: Diagnosis not present

## 2016-02-29 DIAGNOSIS — M6281 Muscle weakness (generalized): Secondary | ICD-10-CM

## 2016-02-29 DIAGNOSIS — R262 Difficulty in walking, not elsewhere classified: Secondary | ICD-10-CM | POA: Diagnosis not present

## 2016-02-29 DIAGNOSIS — M25562 Pain in left knee: Secondary | ICD-10-CM | POA: Diagnosis not present

## 2016-02-29 NOTE — Therapy (Addendum)
[place], [place], [address] Phone: [phone]   Fax:  [phone]  Physical Therapy Treatment  Patient Details  Name: Edward Warren MRN: [id number] Date of Birth: 04-Jun-1955 Referring Provider: Dr. Helane Rima  Encounter Date: 02/29/2016      PT End of Session - 02/29/16 0857    Visit Number 4   Number of Visits 6   Date for PT Re-Evaluation 03/01/16   PT Start Time 0851  pt arrived late   PT Stop Time 0920  pt had to leave early for another appt.    PT Time Calculation (min) 29 min      Past Medical History:  Diagnosis Date  . Asbestosis ([place])    per patient dx by ct scan 2012  . COPD (chronic obstructive pulmonary disease) (HCC)    PFTS: 7-10: TEV1 ration of 62, FEV1 63% and FVC 80% (mod, class II)  . Emphysema of lung ([place])   . History of shingles   . Hypertension   . Personal history of colonic adenomas 07/05/2012    Past Surgical History:  Procedure Laterality Date  . COLONOSCOPY  multiple    There were no vitals filed for this visit.      Subjective Assessment - 02/29/16 0858    Subjective Pt reports he felt pretty good when leaving last appt, but a few hours later his Lt knee felt weak.  His Lt knee kept buckling throughout day.  He has "taken it easy" and not performed any exercises.   He is interested in returning to MD and having imaging completed.    Currently in Pain? No/denies  "Just weak"    Pain Location Knee   Pain Orientation Left;Lateral;Lower            [facility] PT Assessment - 02/29/16 0001      Assessment   Medical Diagnosis OA Lt knee   Referring Provider Dr. Helane Rima   Onset Date/Surgical Date 02/09/15   Hand Dominance Right   Next MD Visit not scheduled     Strength   Left Hip Flexion --  5-/5   Left Hip Extension 4+/5   Left Hip ABduction --  5-/5   Right/Left Knee Left   Left Knee Flexion --  5-/5   Left Knee Extension 4+/5      Flexibility   Hamstrings Lt 68 deg    Quadriceps Lt 120           [facility] Adult PT Treatment/Exercise - 02/29/16 0001      Knee/Hip Exercises: Stretches   Passive Hamstring Stretch Right;Left;2 reps;30 seconds;4 reps  seated, supine   Quad Stretch Left;2 reps;30 seconds   Gastroc Stretch Right;Left;30 seconds;3 reps     Knee/Hip Exercises: Aerobic   Recumbent Bike L3: 4.5 min      Knee/Hip Exercises: Machines for Strengthening   Cybex Knee Extension  BLE to ext and LLE to eccentric flex.  10 reps with BLE to ext, LLE to flex.    Cybex Leg Press LLE only x 5 plates x 10 reps  VC for slow and controlled      Knee/Hip Exercises: Standing   Heel Raises Both;2 sets;10 reps          PT Long Term Goals - 02/22/16 0930      PT LONG TERM GOAL #1   Title I with HEP to include gym workouts ( 03/01/16)    Time 3   Period Weeks  Status On-going     PT LONG TERM GOAL #2   Title increase Lt LE strength =/> 5-/5 ( 03/01/16)    Time 3   Period Weeks   Status On-going     PT LONG TERM GOAL #3   Title report =/> 50% reduction in Lt knee pain with walking ( 03/01/16)    Time 3   Period Weeks   Status On-going     PT LONG TERM GOAL #4   Title improve FOTO =/< 45% limited ( 03/01/16)    Time 3   Period Weeks   Status On-going               Plan - 02/29/16 7902    Clinical Impression Statement Pt demonstrated improved strength in Lt hip.  He continues with decreased strength in Lt quad and decreased flexibility in Lt hamstring.  He tolerated treatment well, without any episodes of buckling.  Pt is progressing towards goals.    Rehab Potential Excellent   PT Frequency 2x / week   PT Duration 3 weeks   PT Treatment/Interventions Moist Heat;Ultrasound;Therapeutic exercise;Dry needling;Taping;Manual techniques;Neuromuscular re-education;Gait training;Cryotherapy;Electrical Stimulation;Patient/family education   PT Next Visit Plan Assess progress and need for  additional therapy sessions (end of POC). FOTO.    Consulted and Agree with Plan of Care Patient      Patient will benefit from skilled therapeutic intervention in order to improve the following deficits and impairments:  Pain, Decreased strength, Decreased range of motion, Difficulty walking  Visit Diagnosis: Chronic pain of left knee  Muscle weakness (generalized)  Difficulty in walking, not elsewhere classified     Problem List Patient Active Problem List   Diagnosis Date Noted  . Osteoarthritis of left knee 02/10/2014  . COPD (chronic obstructive pulmonary disease) ([place]) 10/23/2012  . Personal history of colonic adenomas 07/05/2012  . Severe mitral valve stenosis 07/11/2011  . Diastolic dysfunction 40/97/3532  . HYPOGONADISM 07/01/2010  . MEIBOMIAN CYST 12/08/2009  . TOBACCO ABUSE 10/26/2009  . ALCOHOL ABUSE 02/08/2009  . H/O asbestos exposure 02/08/2009  . GERD 09/09/2008  . HYPERTENSION, BENIGN 08/05/2008   [name], PTA 02/29/16 9:25 AM  [name]  [place], [place], [address] Phone: [phone]   Fax:  [phone]  Name: Edward Warren MRN: [id number] Date of Birth: 1955/09/12  PHYSICAL THERAPY DISCHARGE SUMMARY  Visits from Start of Care: 4  Current functional level related to goals / functional outcomes: unknown  Remaining deficits: unknown   Education / Equipment: HEP Plan:                                                    Patient goals were not met. Patient is being discharged due to not returning since the last visit.  ?????     Jeral Pinch, PT 04/17/16 3:35 PM

## 2016-03-02 ENCOUNTER — Encounter: Payer: Self-pay | Admitting: Physical Therapy

## 2016-03-06 ENCOUNTER — Other Ambulatory Visit: Payer: Self-pay | Admitting: Family Medicine

## 2016-03-06 DIAGNOSIS — R1013 Epigastric pain: Secondary | ICD-10-CM

## 2016-03-07 ENCOUNTER — Encounter: Payer: Self-pay | Admitting: Sports Medicine

## 2016-03-07 ENCOUNTER — Ambulatory Visit (INDEPENDENT_AMBULATORY_CARE_PROVIDER_SITE_OTHER): Payer: 59 | Admitting: Sports Medicine

## 2016-03-07 DIAGNOSIS — M1712 Unilateral primary osteoarthritis, left knee: Secondary | ICD-10-CM

## 2016-03-07 NOTE — Assessment & Plan Note (Signed)
Really has no pain after injection almost 6 weeks ago but has some buckling. He does well after physical therapy, he is desiring an MRI, I do suspect that his buckling is related to a meniscal tear which probably will not be solved without arthroscopy. MRI ordered.

## 2016-03-07 NOTE — Progress Notes (Signed)
  Subjective:    CC: Follow-up  HPI: This is a pleasant 60 year old male with known left knee osteoarthritis, we injected him about a month and a half ago and he has no pain, and has done extremely well after physical therapy. Unfortunately continues to have occasional buckling, catching sensations and desires an MRI, symptoms are moderate, persistent, no radiation.  Past medical history:  Negative.  See flowsheet/record as well for more information.  Surgical history: Negative.  See flowsheet/record as well for more information.  Family history: Negative.  See flowsheet/record as well for more information.  Social history: Negative.  See flowsheet/record as well for more information.  Allergies, and medications have been entered into the medical record, reviewed, and no changes needed.   Review of Systems: No fevers, chills, night sweats, weight loss, chest pain, or shortness of breath.   Objective:    General: Well Developed, well nourished, and in no acute distress.  Neuro: Alert and oriented x3, extra-ocular muscles intact, sensation grossly intact.  HEENT: Normocephalic, atraumatic, pupils equal round reactive to light, neck supple, no masses, no lymphadenopathy, thyroid nonpalpable.  Skin: Warm and dry, no rashes. Cardiac: Regular rate and rhythm, no murmurs rubs or gallops, no lower extremity edema.  Respiratory: Clear to auscultation bilaterally. Not using accessory muscles, speaking in full sentences. Left Knee: Normal to inspection with no erythema or effusion or obvious bony abnormalities. Palpation normal with no warmth or joint line tenderness or patellar tenderness or condyle tenderness. ROM normal in flexion and extension and lower leg rotation. Ligaments with solid consistent endpoints including ACL, PCL, LCL, MCL. Negative Mcmurray's and provocative meniscal tests. Non painful patellar compression. Patellar and quadriceps tendons unremarkable. Hamstring and quadriceps  strength is normal.  Impression and Recommendations:    Osteoarthritis of left knee Really has no pain after injection almost 6 weeks ago but has some buckling. He does well after physical therapy, he is desiring an MRI, I do suspect that his buckling is related to a meniscal tear which probably will not be solved without arthroscopy. MRI ordered.

## 2016-03-13 ENCOUNTER — Ambulatory Visit (INDEPENDENT_AMBULATORY_CARE_PROVIDER_SITE_OTHER): Payer: 59

## 2016-03-13 DIAGNOSIS — M23242 Derangement of anterior horn of lateral meniscus due to old tear or injury, left knee: Secondary | ICD-10-CM

## 2016-03-13 DIAGNOSIS — M1712 Unilateral primary osteoarthritis, left knee: Secondary | ICD-10-CM

## 2016-03-13 DIAGNOSIS — M23222 Derangement of posterior horn of medial meniscus due to old tear or injury, left knee: Secondary | ICD-10-CM | POA: Diagnosis not present

## 2016-03-26 ENCOUNTER — Other Ambulatory Visit: Payer: Self-pay | Admitting: Family Medicine

## 2016-04-03 ENCOUNTER — Other Ambulatory Visit: Payer: Self-pay

## 2016-04-03 DIAGNOSIS — R1013 Epigastric pain: Secondary | ICD-10-CM

## 2016-04-03 MED ORDER — OMEPRAZOLE 40 MG PO CPDR: 40.0000 mg | DELAYED_RELEASE_CAPSULE | Freq: Every day | ORAL | 1 refills | Status: DC

## 2016-04-05 ENCOUNTER — Telehealth: Payer: Self-pay | Admitting: Pulmonary Disease

## 2016-04-05 NOTE — Telephone Encounter (Signed)
Spoke to Dr. Gwynneth Munson -disability His lung function is reasonable-avoid fumes etc. at work

## 2016-04-06 ENCOUNTER — Other Ambulatory Visit: Payer: Self-pay | Admitting: Pulmonary Disease

## 2016-04-06 ENCOUNTER — Other Ambulatory Visit: Payer: Self-pay

## 2016-04-06 DIAGNOSIS — R1013 Epigastric pain: Secondary | ICD-10-CM

## 2016-04-06 MED ORDER — OMEPRAZOLE 40 MG PO CPDR: 40.0000 mg | DELAYED_RELEASE_CAPSULE | Freq: Every day | ORAL | 1 refills | Status: DC

## 2016-04-28 ENCOUNTER — Encounter: Payer: Self-pay | Admitting: Adult Health

## 2016-04-28 ENCOUNTER — Ambulatory Visit (INDEPENDENT_AMBULATORY_CARE_PROVIDER_SITE_OTHER): Payer: 59 | Admitting: Adult Health

## 2016-04-28 DIAGNOSIS — J449 Chronic obstructive pulmonary disease, unspecified: Secondary | ICD-10-CM | POA: Diagnosis not present

## 2016-04-28 NOTE — Progress Notes (Signed)
Subjective:    Patient ID: Edward Warren, male    DOB: February 22, 1956, 60 y.o.   MRN: [id number]  HPI 60 yo male smoker with COPD   TEST   Significant tests/ events PFTs - 12/2013 -FEV1 69%, ratio 63, DLCO 40 Spirometry in June 2014 showed an FEV1 of 2.57-68% with a ratio of 67, and FVC of 82% suggestive of moderate stage II COPD.  PFTs  11/2009 showed a DLCO of 64% and an FEV1 of 63% suggestive of moderate impairment.  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 Charlett Blake. CT chest without contrast in 09/2012 and in 06/2011 did not show any pleural or interstitial abnormality.    04/28/2016 Follow up : ER follow up /COPD  Pt returns for a follow up from recent ER visit at [facility] in [place].  He was seen for Chest pain. Careeverywhere notes reviewed . Troponin neg . CXR w/ no acute findings. EKG NSR.  He is feeling much better , feels like it was muscular. Sx have resolved.   Breathing is about the same, has good days and bad days.  Has some cough on/off , no discolored mucus .  Still smoking , discussed cessation.  Remains on BREO and Spiriva  Flu and PVX utd..  Discussed Prevnar 13 , he wants to hold off today.       Past Medical History:  Diagnosis Date  . 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 COPD (chronic obstructive pulmonary disease) (HCC)    PFTS: 7-10: TEV1 ration of 62, FEV1 63% and FVC 80% (mod, class II)  . Emphysema of lung ([place])   . History of shingles   . Hypertension   . Personal history of colonic adenomas 07/05/2012   Current Outpatient Prescriptions on File Prior to Visit  Medication Sig Dispense Refill  . BREO ELLIPTA 100-25 MCG/INH AEPB INHALE 1 PUFF BY MOUTH DAILY AS DIRECTED 60 each 5  . meloxicam (MOBIC) 15 MG tablet TAKE 1 TABLET (15 MG TOTAL) BY MOUTH DAILY. 90 tablet 3  . Olmesartan-Amlodipine-HCTZ 40-5-25 MG TABS TAKE 1 TABLET BY MOUTH DAILY. 90 tablet 1  . omeprazole (PRILOSEC) 40 MG capsule Take 1 capsule (40 mg total) by  mouth daily. Take 30 min before first meal of day. 90 capsule 1  . PROAIR HFA 108 (90 Base) MCG/ACT inhaler INHALE 2 PUFFS INTO THE LUNGS EVERY 6 (SIX) HOURS AS NEEDED. 18 g 3  . tiotropium (SPIRIVA HANDIHALER) 18 MCG inhalation capsule Place 1 capsule (18 mcg total) into inhaler and inhale daily. 30 capsule 5  . AMBULATORY NON FORMULARY MEDICATION Cane to use for ambulation 1 each 0   No current facility-administered medications on file prior to visit.       Review of Systems Constitutional:   No  weight loss, night sweats,  Fevers, chills, = fatigue, or  lassitude.  HEENT:   No headaches,  Difficulty swallowing,  Tooth/dental problems, or  Sore throat,                No sneezing, itching, ear ache, nasal congestion, post nasal drip,   CV:  No chest pain,  Orthopnea, PND, swelling in lower extremities, anasarca, dizziness, palpitations, syncope.   GI  No heartburn, indigestion, abdominal pain, nausea, vomiting, diarrhea, change in bowel habits, loss of appetite, bloody stools.   Resp:   No chest wall deformity  Skin: no rash or lesions.  GU: no dysuria, change in color  of urine, no urgency or frequency.  No flank pain, no hematuria   MS:  No joint pain or swelling.  No decreased range of motion.  No back pain.  Psych:  No change in mood or affect. No depression or anxiety.  No memory loss.         Objective:   Physical Exam   Vitals:   04/28/16 0922  BP: 140/80  Pulse: 81  Temp: 97.8 F (36.6 C)  TempSrc: Oral  SpO2: 98%  Weight: 223 lb (101.2 kg)  Height: 5\' 11"  (1.803 m)   GEN: A/Ox3; pleasant , NAD,    HEENT:  [place]/AT,  EACs-clear, TMs-wnl, NOSE-clear, THROAT-clear, no lesions, no postnasal drip or exudate noted.   NECK:  Supple w/ fair ROM; no JVD; normal carotid impulses w/o bruits; no thyromegaly or nodules palpated; no lymphadenopathy.    RESP  Decreased BS in bases ; w/o, wheezes/ rales/ or rhonchi. no accessory muscle use, no dullness to  percussion  CARD:  RRR, no m/r/g  , no peripheral edema, pulses intact, no cyanosis or clubbing.  GI:   Soft & nt; nml bowel sounds; no organomegaly or masses detected.   Musco: Warm bil, no deformities or joint swelling noted.   Neuro: alert, no focal deficits noted.    Skin: Warm, no lesions or rashes      Narjis Mira NP-C  [facility]  04/28/2016

## 2016-04-28 NOTE — Assessment & Plan Note (Signed)
Doing well w/ no flare  Smoking cessation   Plan  Patient Instructions  Continue on current regimen .  Follow up with Dr. Elsworth Soho  In 6 months and As needed   Work on not smoking

## 2016-04-28 NOTE — Patient Instructions (Signed)
Continue on current regimen .  Follow up with Dr. Elsworth Soho  In 6 months and As needed   Work on not smoking

## 2016-05-27 NOTE — Progress Notes (Signed)
Reviewed & agree with plan  

## 2016-06-13 ENCOUNTER — Telehealth: Payer: Self-pay | Admitting: Family Medicine

## 2016-06-13 NOTE — Telephone Encounter (Signed)
Spoke w/pt's wife and informed her that Dr. Madilyn Fireman does have his forms however due to the inclement weather last week and the fact that our office was closed for 2 days Dr. Madilyn Fireman was unable to complete his forms in a timely manner. I asked her when did he need to have this in to his lawyer she stated that she was unsure and that she would have him call and give [place] a call back to let us know.Maryruth Eve, Lahoma Crocker

## 2016-06-13 NOTE — Telephone Encounter (Signed)
Patient's wife called request to know if paper work for her husband had been completed yet because they are on a deadline and have to get the paper work to his attorney. Wife stated she dropped it off on Monday 06/05/16 I adv her that the office was closed 1/17 and 1/18 so that would have a lot to do with it not being completed. Will send a phone note. Thanks

## 2016-06-16 ENCOUNTER — Telehealth: Payer: Self-pay | Admitting: *Deleted

## 2016-06-16 NOTE — Telephone Encounter (Signed)
Copied and sent forms to scan ( immediate scan). Called and left message fo patient to let him know forms are ready to be picked up ([employer] )

## 2016-06-27 ENCOUNTER — Telehealth: Payer: Self-pay | Admitting: *Deleted

## 2016-06-27 NOTE — Telephone Encounter (Signed)
Returning pt's call.Edward Warren

## 2016-07-05 NOTE — Telephone Encounter (Signed)
Completed form faxed, confirmation received, original placed up front, copy made for scan.Edward Warren, Edward Warren

## 2016-07-05 NOTE — Telephone Encounter (Signed)
Called and lvm informed pt that form was completed and faxed original is up front for him to p/u.[name] KitchenMarland KitchenAudelia Hives [place]

## 2016-08-21 ENCOUNTER — Other Ambulatory Visit: Payer: Self-pay | Admitting: Sports Medicine

## 2016-10-11 ENCOUNTER — Other Ambulatory Visit: Payer: Self-pay | Admitting: Sports Medicine

## 2016-10-11 ENCOUNTER — Other Ambulatory Visit: Payer: Self-pay | Admitting: Pulmonary Disease

## 2017-01-29 IMAGING — MR MR KNEE*L* W/O CM
5 series · 39 of 40 positions shown · non-contrast
Comparison: None.

CLINICAL DATA: Left knee pain and swelling for 3-4 years

EXAM:
MRI OF THE LEFT KNEE WITHOUT CONTRAST
TECHNIQUE: Multiplanar, multisequence MR imaging of the knee was performed. No
intravenous contrast was administered.

[Series 3: PD fat-sat · axial · 3.0mm · 0.33mm/px · z∈[-30,+89]mm · 8 of 37 slices shown (1 of 3)]
[im 1/37]
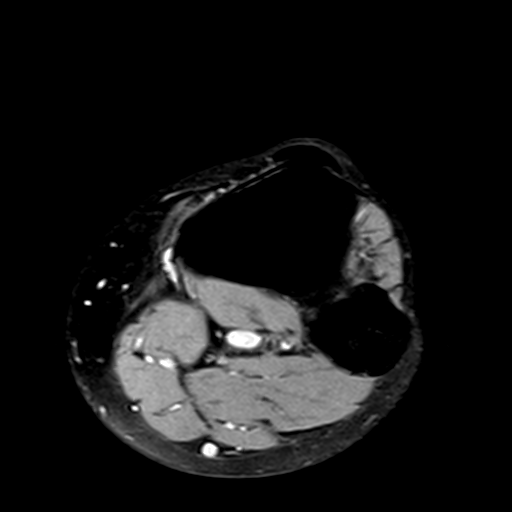
[im 6/37]
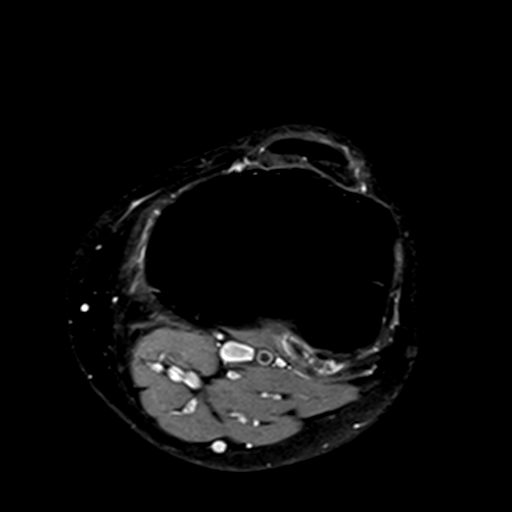
[im 11/37]
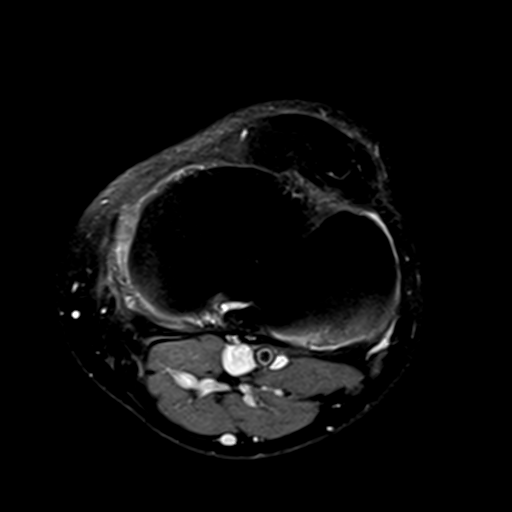
[im 16/37]
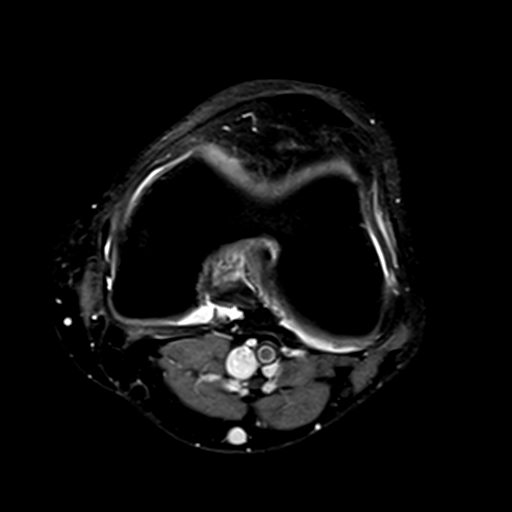
[im 21/37]
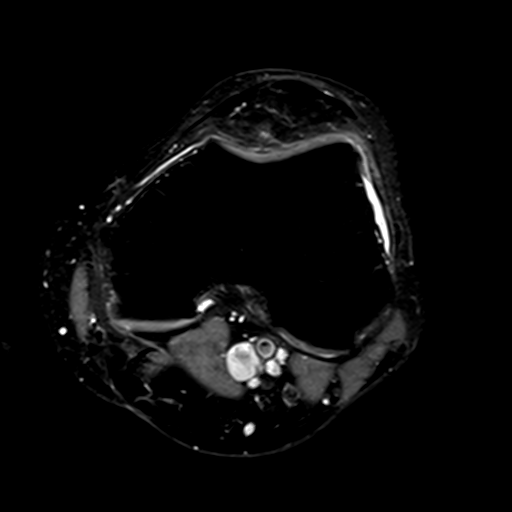
[im 26/37]
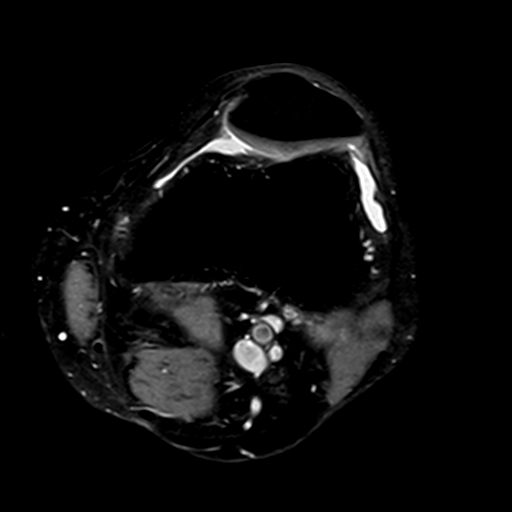
[im 31/37]
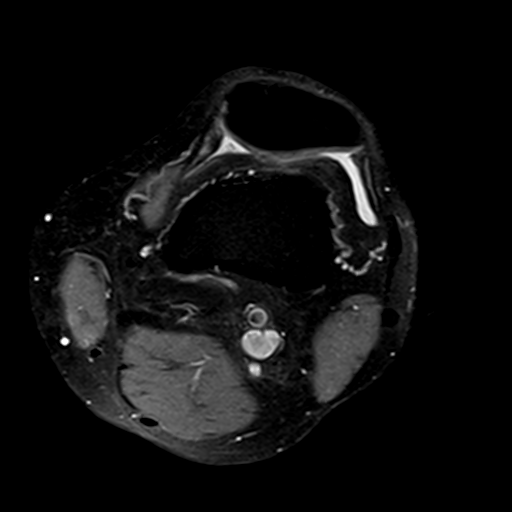
[im 37/37]
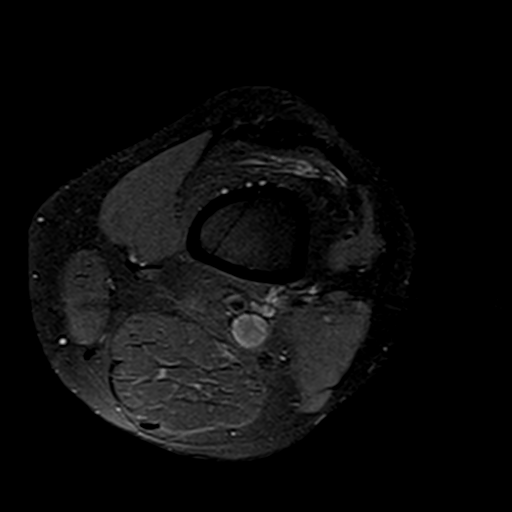

[Series 4: T1 · coronal · 3.0mm · 0.50mm/px · 6 of 33 slices shown]
[im 1/33]
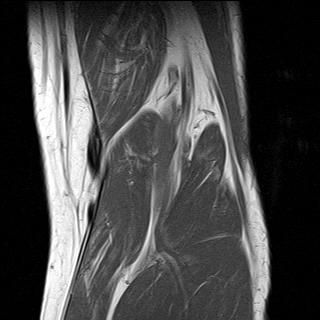
[im 6/33]
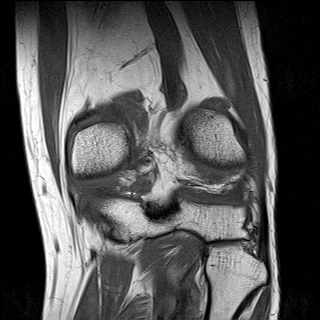
[im 11/33]
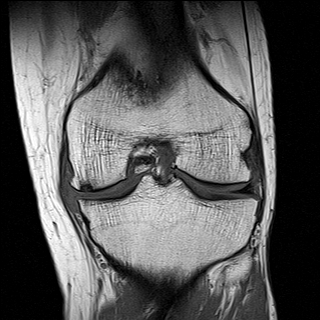
[im 17/33]
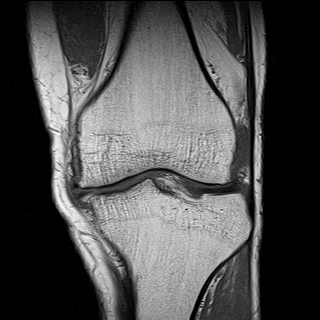
[im 22/33]
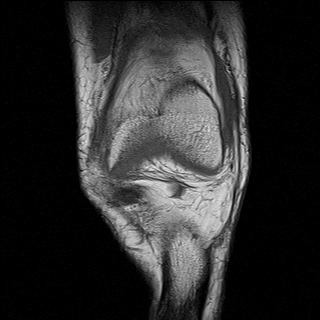
[im 27/33]
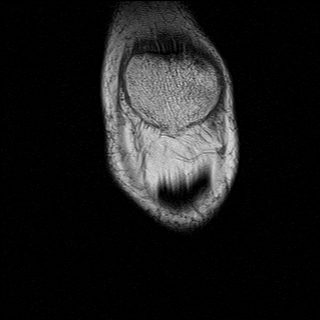

[Series 5: T2 fat-sat · coronal · 3.0mm · 0.50mm/px · 8 of 33 slices shown]
[im 1/33]
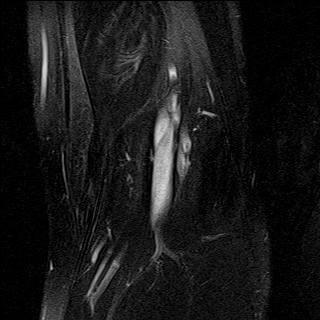
[im 5/33]
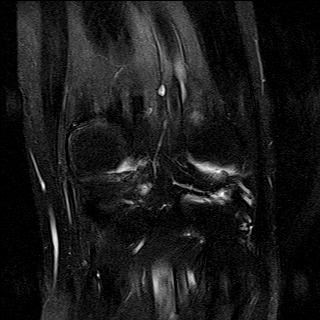
[im 10/33]
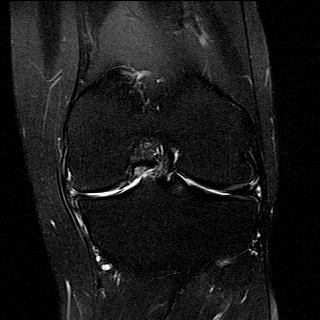
[im 14/33]
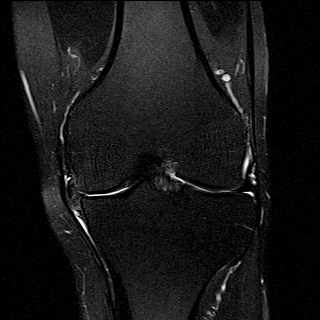
[im 19/33]
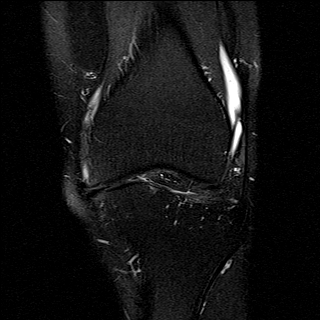
[im 23/33]
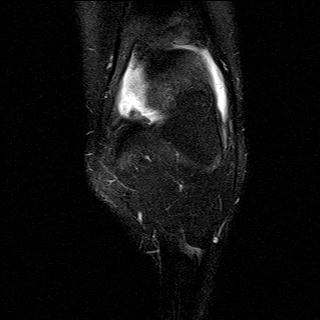
[im 28/33]
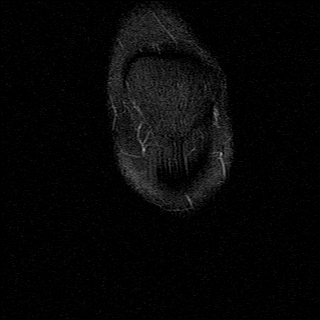
[im 33/33]
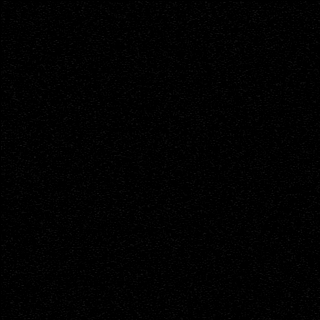

[Series 6: PD fat-sat · coronal · 3.0mm · 0.62mm/px · 8 of 33 slices shown (2 of 3)]
[im 1/33]
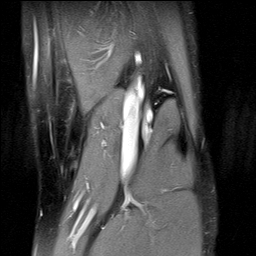
[im 5/33]
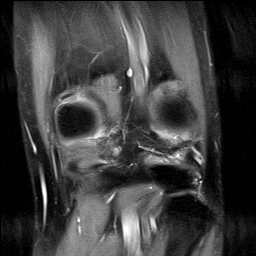
[im 10/33]
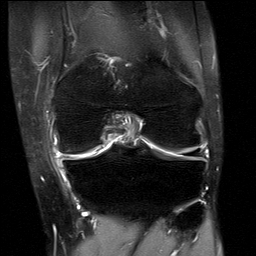
[im 14/33]
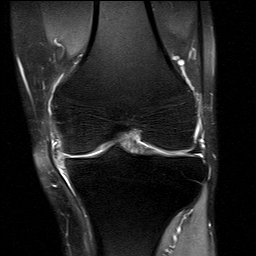
[im 19/33]
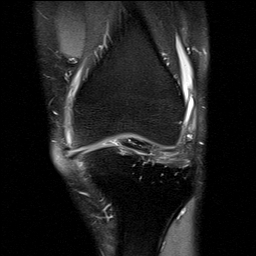
[im 23/33]
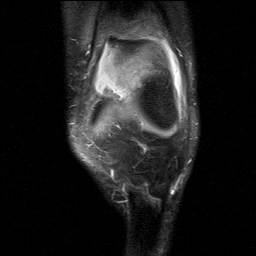
[im 28/33]
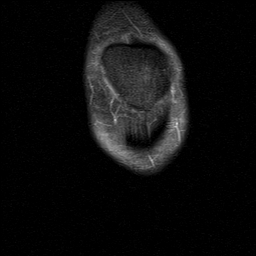
[im 33/33]
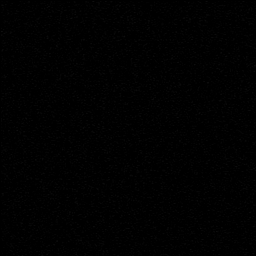

[Series 7: PD fat-sat · sagittal · 3.0mm · 0.62mm/px · 9 of 37 slices shown (3 of 3)]
[im 1/37]
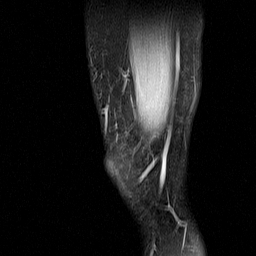
[im 5/37]
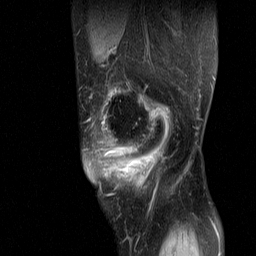
[im 10/37]
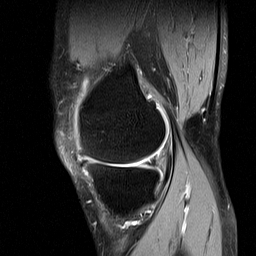
[im 14/37]
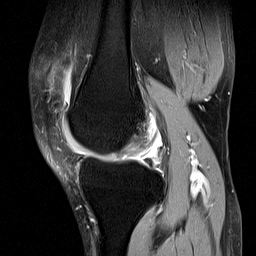
[im 19/37]
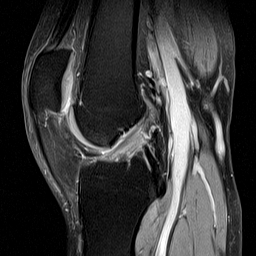
[im 23/37]
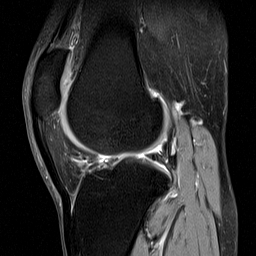
[im 28/37]
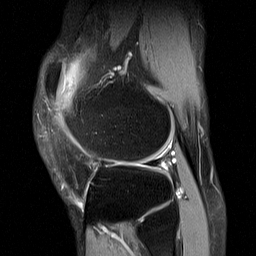
[im 32/37]
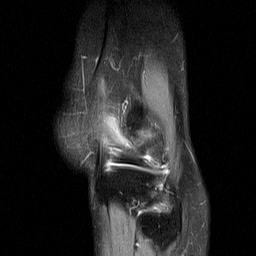
[im 37/37]
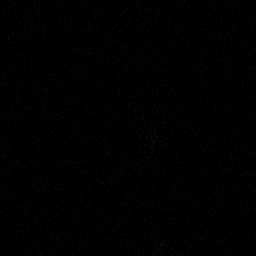

[39 of 40 positions shown; findings below may reference images not displayed]

FINDINGS: MENISCI

Medial meniscus: Maceration of the body of the medial meniscus
extending into the posterior horn.

Lateral meniscus: Small tear of the free edge of the anterior horn
of the lateral meniscus.

LIGAMENTS

Cruciates:  Intact ACL and PCL.

Collaterals: Medial collateral ligament is intact. Lateral
collateral ligament complex is intact.

CARTILAGE

Patellofemoral: Mild partial-thickness cartilage loss of the lateral
patellofemoral compartment.

Medial: Full-thickness cartilage loss of the medial femoral condyle
and medial tibial plateau with mild subchondral reactive marrow
changes in the medial femoral condyle.

Lateral: Partial-thickness cartilage loss of the lateral
femorotibial compartment.

Joint:  No joint effusion.  Normal Hoffa's fat.

Popliteal Fossa:  No Baker cyst.  Intact popliteus tendon.

Extensor Mechanism:  Intact quadriceps tendon patellar tendon.

Bones:  No marrow signal abnormality.  No fracture or dislocation.

Other: No fluid collection or hematoma.
IMPRESSION: 1. Maceration of the body of the medial meniscus extending into the
posterior horn.
2. Small tear of the free edge of the anterior horn of the lateral
meniscus.
3. Tricompartmental cartilage abnormalities as described above

## 2017-02-06 ENCOUNTER — Encounter: Payer: Self-pay | Admitting: Family Medicine

## 2017-02-06 ENCOUNTER — Ambulatory Visit (INDEPENDENT_AMBULATORY_CARE_PROVIDER_SITE_OTHER): Payer: 59 | Admitting: Family Medicine

## 2017-02-06 VITALS — BP 126/78 | HR 82 | Ht 74.0 in | Wt 230.0 lb

## 2017-02-06 DIAGNOSIS — F3289 Other specified depressive episodes: Secondary | ICD-10-CM

## 2017-02-06 DIAGNOSIS — Z720 Tobacco use: Secondary | ICD-10-CM | POA: Diagnosis not present

## 2017-02-06 DIAGNOSIS — I2584 Coronary atherosclerosis due to calcified coronary lesion: Secondary | ICD-10-CM

## 2017-02-06 DIAGNOSIS — Z0000 Encounter for general adult medical examination without abnormal findings: Secondary | ICD-10-CM

## 2017-02-06 DIAGNOSIS — Z1159 Encounter for screening for other viral diseases: Secondary | ICD-10-CM | POA: Diagnosis not present

## 2017-02-06 DIAGNOSIS — I2510 Atherosclerotic heart disease of native coronary artery without angina pectoris: Secondary | ICD-10-CM | POA: Diagnosis not present

## 2017-02-06 NOTE — Patient Instructions (Addendum)

## 2017-02-06 NOTE — Progress Notes (Signed)
Subjective:    Patient ID: Edward Warren, male    DOB: 1955-09-18, 61 y.o.   MRN: [id number]  HPI  61 year old male here today for complete physical exam.he has no specific concerns today but doesn't really wants to get updated labs today. He is not exercising currently. He says over the last couple of months because of the heat he has not been working out. He does have an eye exam scheduled for this fall. He does have his pulmonary follow-up scheduled for December.  He would also like to have a carotid ultrasound done as he is worried about the risk for peripheral vascular disease.  Tobacco abuse-he is still currently smoking about a half a pack per day.  He reports that he's been under a lot of stress lately. He has filed for disability through work. It's been going on for most of the year.. They do have a mediation meeting later this week. He is also not been working out because of the heat and has gained some weight.  Review of Systems Comprehensive  ROS is negative.   BP 126/78   Pulse 82   Ht 6\' 2"  (1.88 m)   Wt 230 lb (104.3 kg)   SpO2 100%   BMI 29.53 kg/m     Allergies  Allergen Reactions  . Prednisone Other (See Comments)    Nightmares and suicidal thoughts    Past Medical History:  Diagnosis Date  . 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 COPD (chronic obstructive pulmonary disease) (HCC)    PFTS: 7-10: TEV1 ration of 62, FEV1 63% and FVC 80% (mod, class II)  . Emphysema of lung ([place])   . History of shingles   . Hypertension   . Personal history of colonic adenomas 07/05/2012    Past Surgical History:  Procedure Laterality Date  . COLONOSCOPY  multiple    Social History   Social History  . Marital status: Married    Spouse name: N/A  . Number of children: 1  . Years of education: N/A   Occupational History  . disabled     [employer]   Social History Main Topics  . Smoking status: Current Every Day Smoker    Packs/day: 0.10     Years: 42.00    Types: Cigarettes  . Smokeless tobacco: Never Used     Comment: Smokes 3-4 cigarettes a week 12/8  . Alcohol use 3.0 oz/week    5 Standard drinks or equivalent per week  . Drug use: No  . Sexual activity: Not on file   Other Topics Concern  . Not on file   Social History Narrative   No regular exercise.     Family History  Problem Relation Age of Onset  . Stroke Maternal Grandfather   . Breast cancer Maternal Aunt   . Cancer Maternal Uncle        throat  . Colon cancer Neg Hx   . COPD Mother   . Heart attack Father     Outpatient Encounter Prescriptions as of 02/06/2017  Medication Sig  . AMBULATORY NON FORMULARY MEDICATION Cane to use for ambulation  . BREO ELLIPTA 100-25 MCG/INH AEPB INHALE 1 PUFF BY MOUTH DAILY AS DIRECTED  . meloxicam (MOBIC) 15 MG tablet TAKE 1 TABLET (15 MG TOTAL) BY MOUTH DAILY.  [name] Kitchen Olmesartan-Amlodipine-HCTZ 40-5-25 MG TABS TAKE 1 TABLET BY MOUTH DAILY.  [name] Kitchen omeprazole (PRILOSEC) 40 MG capsule Take 1 capsule (40  mg total) by mouth daily. Take 30 min before first meal of day.  [name] Kitchen PROAIR HFA 108 (90 Base) MCG/ACT inhaler INHALE 2 PUFFS INTO THE LUNGS EVERY 6 (SIX) HOURS AS NEEDED.  [name] Kitchen tiotropium (SPIRIVA HANDIHALER) 18 MCG inhalation capsule Place 1 capsule (18 mcg total) into inhaler and inhale daily.   No facility-administered encounter medications on file as of 02/06/2017.          Objective:   Physical Exam        Assessment & Plan:  Keep up a regular exercise program and make sure you are eating a healthy diet Try to eat 4 servings of dairy a day, or if you are lactose intolerant take a calcium with vitamin D daily.  Your vaccines are up to date.  Did encourage him to get back into his regular exercise routine. Will screen for hepatitis C. Screen for diabetes. Will screen for prostate cancer.  Known coronary artery calcification-we'll schedule for carotid Dopplers.  Tobacco abuse-encourage cession. He does have some  patches home and says they actually do work well. He is at the point where he really would like to quit smoking.  Positive depression screen- he feels he is doing OK.  He is awaiting a determination for his disability and that has been stressful. It's been pushed off for almost 9 months now. He family has a mediation eating later this week. And he hasn't been working out regularly and has gained some weight and that has made him fbit more down than usue of treatme or therapy at this point and says he'll let me know if he feels like his symptoms are not improving or if they get worse.

## 2017-02-07 LAB — COMPLETE METABOLIC PANEL WITH GFR
AG RATIO: 1.7 (calc) (ref 1.0–2.5)
ALBUMIN MSPROF: 4.3 g/dL (ref 3.6–5.1)
ALKALINE PHOSPHATASE (APISO): 75 U/L (ref 40–115)
ALT: 19 U/L (ref 9–46)
AST: 20 U/L (ref 10–35)
BILIRUBIN TOTAL: 0.7 mg/dL (ref 0.2–1.2)
BUN: 20 mg/dL (ref 7–25)
CHLORIDE: 105 mmol/L (ref 98–110)
CO2: 21 mmol/L (ref 20–32)
Calcium: 9.6 mg/dL (ref 8.6–10.3)
Creat: 1.17 mg/dL (ref 0.70–1.25)
GFR, EST AFRICAN AMERICAN: 76 mL/min/{1.73_m2} (ref >=60)
GFR, Est Non African American: 66 mL/min/{1.73_m2} (ref >=60)
GLOBULIN: 2.6 g/dL (ref 1.9–3.7)
GLUCOSE: 119 mg/dL — AB (ref 65–99)
POTASSIUM: 3.8 mmol/L (ref 3.5–5.3)
SODIUM: 137 mmol/L (ref 135–146)
TOTAL PROTEIN: 6.9 g/dL (ref 6.1–8.1)

## 2017-02-07 LAB — HEPATITIS C ANTIBODY
Hepatitis C Ab: NONREACTIVE
SIGNAL TO CUT-OFF: 0.14 (ref <1.00)

## 2017-02-07 LAB — LIPID PANEL W/REFLEX DIRECT LDL
Cholesterol: 159 mg/dL
HDL: 60 mg/dL
LDL Cholesterol (Calc): 70 mg/dL
Non-HDL Cholesterol (Calc): 99 mg/dL
Total CHOL/HDL Ratio: 2.7 (calc)
Triglycerides: 248 mg/dL — ABNORMAL HIGH

## 2017-02-07 LAB — HEMOGLOBIN A1C
EAG (MMOL/L): 5.4 (calc)
Hgb A1c MFr Bld: 5 % of total Hgb (ref <5.7)
MEAN PLASMA GLUCOSE: 97 (calc)

## 2017-02-07 LAB — PSA: PSA: 0.8 ng/mL

## 2017-02-13 ENCOUNTER — Ambulatory Visit (HOSPITAL_BASED_OUTPATIENT_CLINIC_OR_DEPARTMENT_OTHER)
Admission: RE | Admit: 2017-02-13 | Discharge: 2017-02-13 | Disposition: A | Discharge status: Home / Self Care | Payer: 59 | Source: Ambulatory Visit | Attending: Family Medicine | Admitting: Family Medicine

## 2017-02-13 DIAGNOSIS — I6523 Occlusion and stenosis of bilateral carotid arteries: Secondary | ICD-10-CM | POA: Insufficient documentation

## 2017-02-13 DIAGNOSIS — I2584 Coronary atherosclerosis due to calcified coronary lesion: Secondary | ICD-10-CM

## 2017-02-13 DIAGNOSIS — I2510 Atherosclerotic heart disease of native coronary artery without angina pectoris: Secondary | ICD-10-CM

## 2017-03-29 ENCOUNTER — Telehealth: Payer: Self-pay | Admitting: Pulmonary Disease

## 2017-03-29 MED ORDER — AZITHROMYCIN 250 MG PO TABS: ORAL_TABLET | ORAL | 0 refills | Status: AC

## 2017-03-29 NOTE — Telephone Encounter (Signed)
Pt's wife calling back asking when they can expect to hear what Dr. Elsworth Soho has decided for pt.-tr

## 2017-03-29 NOTE — Telephone Encounter (Signed)
RA - please advise. Thanks! 

## 2017-03-29 NOTE — Telephone Encounter (Signed)
Pt is of RA's recommendations and voiced his understanding. Rx has been sent to preferred pharmacy.  Nothing further needed.

## 2017-03-29 NOTE — Telephone Encounter (Signed)
Spoke with pt's wife, she states pt has chest tightness, coughing, and chest tightness and states the Zpak helped him last time. The mucus he is coughing up is white with a shade of yellow. Denies fever but feels SOB. RA please advise.   [facility]

## 2017-03-29 NOTE — Telephone Encounter (Signed)
Wife Edward Warren [phone]) called back about the status of this message; was told medication would be entered and will receive a phone call; still haven't heard anything

## 2017-03-29 NOTE — Telephone Encounter (Signed)
lmtcb x1 for pt. 

## 2017-03-29 NOTE — Telephone Encounter (Signed)
z-pak 

## 2017-04-11 ENCOUNTER — Other Ambulatory Visit: Payer: Self-pay | Admitting: Pulmonary Disease

## 2017-04-11 ENCOUNTER — Other Ambulatory Visit: Payer: Self-pay | Admitting: Sports Medicine

## 2017-07-23 ENCOUNTER — Other Ambulatory Visit: Payer: Self-pay

## 2017-07-23 MED ORDER — FLUTICASONE FUROATE-VILANTEROL 100-25 MCG/INH IN AEPB: INHALATION_SPRAY | RESPIRATORY_TRACT | 0 refills | Status: DC

## 2017-08-15 ENCOUNTER — Other Ambulatory Visit: Payer: Self-pay | Admitting: Family Medicine

## 2017-08-15 DIAGNOSIS — R1013 Epigastric pain: Secondary | ICD-10-CM

## 2017-09-10 ENCOUNTER — Ambulatory Visit (INDEPENDENT_AMBULATORY_CARE_PROVIDER_SITE_OTHER): Payer: 59 | Admitting: Family Medicine

## 2017-09-10 ENCOUNTER — Telehealth: Payer: Self-pay | Admitting: Emergency Medicine

## 2017-09-10 ENCOUNTER — Encounter: Payer: Self-pay | Admitting: Family Medicine

## 2017-09-10 ENCOUNTER — Other Ambulatory Visit: Payer: Self-pay

## 2017-09-10 VITALS — BP 136/86 | HR 104 | Temp 97.7°F | Resp 18 | Wt 230.0 lb

## 2017-09-10 DIAGNOSIS — M25462 Effusion, left knee: Secondary | ICD-10-CM | POA: Diagnosis not present

## 2017-09-10 MED ORDER — COLCHICINE 0.6 MG PO TABS: 0.6000 mg | ORAL_TABLET | Freq: Every day | ORAL | 2 refills | Status: DC

## 2017-09-10 NOTE — Telephone Encounter (Signed)
Rx today costs #395; can there be more cost effective substitute?

## 2017-09-10 NOTE — Progress Notes (Addendum)
Edward Warren is a 62 y.o. male who presents to [place]: [place] today for left knee pain.   Patient has a history of knee pain and degenerative joint disease. On Saturday, his knee began hurting and swelling up. He was not doing anything specific at the time to elicit this pain. He states the pain is worse with flexion of his knee and weight bearing.  He has had similar symptoms in the past.  He is not sure if he is ever been diagnosed with gout.  He has not tried any medications yet.  No fevers or chills.  He cannot recall any injury.   Past Medical History:  Diagnosis Date  . Asbestosis ([place])    per patient dx by ct scan 2012  . COPD (chronic obstructive pulmonary disease) (HCC)    PFTS: 7-10: TEV1 ration of 62, FEV1 63% and FVC 80% (mod, class II)  . Emphysema of lung ([place])   . History of shingles   . Hypertension   . Personal history of colonic adenomas 07/05/2012   Past Surgical History:  Procedure Laterality Date  . COLONOSCOPY  multiple   Social History   Tobacco Use  . Smoking status: Current Every Day Smoker    Packs/day: 0.10    Years: 42.00    Pack years: 4.20    Types: Cigarettes  . Smokeless tobacco: Never Used  . Tobacco comment: Smokes 3-4 cigarettes a week 12/8  Substance Use Topics  . Alcohol use: Yes    Alcohol/week: 3.0 oz    Types: 5 Standard drinks or equivalent per week   family history includes Breast cancer in his maternal aunt; COPD in his mother; Cancer in his maternal uncle; Heart attack in his father; Stroke in his maternal grandfather.  ROS as above:  Medications: Current Outpatient Medications  Medication Sig Dispense Refill  . AMBULATORY NON FORMULARY MEDICATION Cane to use for ambulation 1 each 0  . colchicine 0.6 MG tablet Take 1 tablet (0.6 mg total) by mouth daily. 30 tablet 2  . fluticasone furoate-vilanterol  (BREO ELLIPTA) 100-25 MCG/INH AEPB INHALE 1 PUFF BY MOUTH DAILY AS DIRECTED 60 each 0  . meloxicam (MOBIC) 15 MG tablet TAKE 1 TABLET (15 MG TOTAL) BY MOUTH DAILY. 90 tablet 2  . Olmesartan-Amlodipine-HCTZ 40-5-25 MG TABS TAKE 1 TABLET BY MOUTH DAILY. 90 tablet 1  . omeprazole (PRILOSEC) 40 MG capsule TAKE 1 CAPSULE (40 MG TOTAL) BY MOUTH DAILY. TAKE 30 MIN BEFORE FIRST MEAL OF DAY. 90 capsule 3  . PROAIR HFA 108 (90 Base) MCG/ACT inhaler INHALE 2 PUFFS INTO THE LUNGS EVERY 6 (SIX) HOURS AS NEEDED. 18 g 3  . tiotropium (SPIRIVA HANDIHALER) 18 MCG inhalation capsule Place 1 capsule (18 mcg total) into inhaler and inhale daily. 30 capsule 5   No current facility-administered medications for this visit.    Allergies  Allergen Reactions  . Prednisone Other (See Comments)    Nightmares and suicidal thoughts    Health Maintenance Health Maintenance  Topic Date Due  . HIV Screening  11/30/1970  . INFLUENZA VACCINE  02/06/2018 (Originally 12/20/2017)  . COLONOSCOPY  11/28/2017  . TETANUS/TDAP  04/18/2021  . Hepatitis C Screening  Completed     Exam:  BP 136/86 (BP Location: Left Arm, Patient Position: Sitting, Cuff Size: Large)   Pulse (!) 104   Temp 97.7 F (36.5 C)   Resp 18   Wt 230 lb (104.3  kg)   SpO2 98%   BMI 29.53 kg/m  Gen: Well NAD HEENT: EOMI,  MMM Lungs: Normal work of breathing. CTABL Heart: RRR no MRG Abd: NABS, Soft. Nondistended, Nontender Exts: Brisk capillary refill, warm and well perfused. ,  Left Knee:  Patient's knee has large knee effusion.  No erythema or masses present. ROM limited by pain. Tender to palpation.  Ligamentous stability intact  Procedure: Real-time Ultrasound Guided aspiration and injection of left knee Device: GE Logiq E   Images permanently stored and available for review in the ultrasound unit. Verbal informed consent obtained.  Discussed risks and benefits of procedure. Warned about infection bleeding damage to structures skin  hypopigmentation and fat atrophy among others. Patient expresses understanding and agreement Time-out conducted.   Noted no overlying erythema, induration, or other signs of local infection.   Skin prepped in a sterile fashion.   Local anesthesia: Topical Ethyl chloride.   With sterile technique and under real time ultrasound guidance:  2 mL of lidocaine injected subcutaneously and into the knee joint easily.   The skin was then cleaned with chlorhexedine again and an 18-gauge needle was used to access the effusion. 9ml of yellow cloudy fluid was aspirated. The syringe and exchanged and 80mg  of kenalog and 75ml marcaine was injection Completed without difficulty   Pain immediately resolved suggesting accurate placement of the medication.   Advised to call if fevers/chills, erythema, induration, drainage, or persistent bleeding.   Images permanently stored and available for review in the ultrasound unit.  Impression: Technically successful ultrasound guided injection.    EXAM: MRI OF THE LEFT KNEE WITHOUT CONTRAST  TECHNIQUE: Multiplanar, multisequence MR imaging of the knee was performed. No intravenous contrast was administered.  COMPARISON:  None.  FINDINGS: MENISCI  Medial meniscus: Maceration of the body of the medial meniscus extending into the posterior horn.  Lateral meniscus: Small tear of the free edge of the anterior horn of the lateral meniscus.  LIGAMENTS  Cruciates:  Intact ACL and PCL.  Collaterals: Medial collateral ligament is intact. Lateral collateral ligament complex is intact.  CARTILAGE  Patellofemoral: Mild partial-thickness cartilage loss of the lateral patellofemoral compartment.  Medial: Full-thickness cartilage loss of the medial femoral condyle and medial tibial plateau with mild subchondral reactive marrow changes in the medial femoral condyle.  Lateral: Partial-thickness cartilage loss of the lateral femorotibial  compartment.  Joint:  No joint effusion.  Normal Hoffa's fat.  Popliteal Fossa:  No Baker cyst.  Intact popliteus tendon.  Extensor Mechanism:  Intact quadriceps tendon patellar tendon.  Bones:  No marrow signal abnormality.  No fracture or dislocation.  Other: No fluid collection or hematoma.  IMPRESSION: 1. Maceration of the body of the medial meniscus extending into the posterior horn. 2. Small tear of the free edge of the anterior horn of the lateral meniscus. 3. Tricompartmental cartilage abnormalities as described above   Electronically Signed   By: Kathreen Devoid   On: 03/13/2016 12:10 I personally (independently) visualized and performed the interpretation of the images attached in this note.      Assessment and Plan: 62 y.o. male with knee effusion.   Patient has a large knee effusion. Patient has a history of alcohol abuse making gout likely. We drained the knee and did a steroid injection. We are sending the fluid off to check for gout crystals and culture. Gout suspicion is high enough to preemptively treat with colchicine. Patient should experience pain relief from the steroid in 2-3 weeks. He  should return if the fluid returns or the knee pain worsens.    Recheck PRN.    Orders Placed This Encounter  Procedures  . Anaerobic and Aerobic Culture    Left knee  . Synovial cell count + diff, w/ crystals    Left knee fluid   Meds ordered this encounter  Medications  . colchicine 0.6 MG tablet    Sig: Take 1 tablet (0.6 mg total) by mouth daily.    Dispense:  30 tablet    Refill:  2     Discussed warning signs or symptoms. Please see discharge instructions. Patient expresses understanding.  This patient was seen and interviewed and examined independently by myself. I significantly edited the note.  Lynne Leader, MD

## 2017-09-10 NOTE — Telephone Encounter (Signed)
Left instructions for [email or web address] to find cochicine for $66; asked her to call if this is still not acceptable or if she needs our help.

## 2017-09-10 NOTE — Telephone Encounter (Signed)
The max cost of this medicine with the Good Rx coupon should be $66.59. Will this be acceptable?

## 2017-09-10 NOTE — Patient Instructions (Signed)
Thank you for coming in today. Call or go to the ER if you develop a large red swollen joint with extreme pain or oozing puss.  Use colchicine daily for possible gout.  I will get results to you ASAP.  Recheck with me in 1 month for recheck.,    Knee Effusion Knee effusion means that you have extra fluid in your knee. This can cause pain. Your knee may be more difficult to bend and move. Follow these instructions at home:  Use crutches as told by your doctor.  Wear a knee brace as told by your doctor.  Apply ice to the swollen area: ? Put ice in a plastic bag. ? Place a towel between your skin and the bag. ? Leave the ice on for 20 minutes, 2-3 times per day.  Keep your knee raised (elevated) when you are sitting or lying down.  Take medicines only as told by your doctor.  Do any rehabilitation or strengthening exercises as told by your doctor.  Rest your knee as told by your doctor. You may start doing your normal activities again when your doctor says it is okay.  Keep all follow-up visits as told by your doctor. This is important. Contact a doctor if:  You continue to have pain in your knee. Get help right away if:  You have increased swelling or redness of your knee.  You have severe pain in your knee.  You have a fever. This information is not intended to replace advice given to you by your health care provider. Make sure you discuss any questions you have with your health care provider. Document Released: 06/10/2010 Document Revised: 10/14/2015 Document Reviewed: 12/22/2013 Elsevier Interactive Patient Education  2018 [employer].

## 2017-09-11 ENCOUNTER — Encounter: Payer: Self-pay | Admitting: Family Medicine

## 2017-09-11 DIAGNOSIS — M109 Gout, unspecified: Secondary | ICD-10-CM | POA: Insufficient documentation

## 2017-09-11 LAB — SYNOVIAL CELL COUNT + DIFF, W/ CRYSTALS
Basophils, %: 0 %
Eosinophils-Synovial: 0 % (ref 0–2)
Lymphocytes-Synovial Fld: 4 % (ref 0–74)
Monocyte/Macrophage: 1 % (ref 0–69)
Neutrophil, Synovial: 95 % — ABNORMAL HIGH (ref 0–24)
SYNOVIOCYTES, %: 0 % (ref 0–15)
WBC, Synovial: 21250 cells/uL — ABNORMAL HIGH (ref <150)

## 2017-09-16 LAB — ANAEROBIC AND AEROBIC CULTURE
MICRO NUMBER: 90491379
MICRO NUMBER:: 90491380
SPECIMEN QUALITY:: ADEQUATE
SPECIMEN QUALITY:: ADEQUATE

## 2017-09-24 ENCOUNTER — Other Ambulatory Visit: Payer: Self-pay | Admitting: Sports Medicine

## 2017-09-25 ENCOUNTER — Ambulatory Visit (INDEPENDENT_AMBULATORY_CARE_PROVIDER_SITE_OTHER): Payer: 59 | Admitting: Family Medicine

## 2017-09-25 ENCOUNTER — Encounter: Payer: Self-pay | Admitting: Family Medicine

## 2017-09-25 VITALS — BP 131/83 | HR 90 | Ht 74.0 in | Wt 230.0 lb

## 2017-09-25 DIAGNOSIS — J449 Chronic obstructive pulmonary disease, unspecified: Secondary | ICD-10-CM | POA: Diagnosis not present

## 2017-09-25 DIAGNOSIS — M1A0690 Idiopathic chronic gout, unspecified knee, without tophus (tophi): Secondary | ICD-10-CM | POA: Diagnosis not present

## 2017-09-25 DIAGNOSIS — I10 Essential (primary) hypertension: Secondary | ICD-10-CM | POA: Diagnosis not present

## 2017-09-25 MED ORDER — ALLOPURINOL 100 MG PO TABS: 100.0000 mg | ORAL_TABLET | Freq: Every day | ORAL | 1 refills | Status: DC

## 2017-09-25 NOTE — Patient Instructions (Signed)
Plan to recheck uric acid in about 2 months. Can call around that time and we will send a lab slip to the labs.   Take the colchine and the allopurinol once a day.

## 2017-09-25 NOTE — Progress Notes (Signed)
Subjective:    CC: 6 month f/u  HPI:  Hypertension- Pt denies chest pain, SOB, dizziness, or heart palpitations.  Taking meds as directed w/o problems.  Denies medication side effects.  He is currently on Tribenzor.  F/U COPD -He is using Breo daily.  And albuterol as needed.  On average he grabs his albuterol about 2 to 3 days/week.  The spring pollen has aggravated his breathing just slightly but no recent flares or exacerbations.  New dx of gout -currently came in with a swollen inflamed knee and saw 1 of our sports medicine providers who did a urinalysis which did show crystals.  Rarely they put him on colchicine but wanted him to follow-up with me to discuss starting a treatment regimen for gout.  Past medical history, Surgical history, Family history not pertinant except as noted below, Social history, Allergies, and medications have been entered into the medical record, reviewed, and corrections made.   Review of Systems: No fevers, chills, night sweats, weight loss, chest pain, or shortness of breath.   Objective:    General: Well Developed, well nourished, and in no acute distress.  Neuro: Alert and oriented x3, extra-ocular muscles intact, sensation grossly intact.  HEENT: Normocephalic, atraumatic  Skin: Warm and dry, no rashes. Cardiac: Regular rate and rhythm, no murmurs rubs or gallops, no lower extremity edema.  Respiratory: Clear to auscultation bilaterally. Not using accessory muscles, speaking in full sentences.   Impression and Recommendations:    HTN - Well controlled. Continue current regimen. Follow up in  6 months.  Due for BMP.  COPD -stable.  Continue current regimen.  We will continue to monitor.  Gout -discussed the need to start allopurinol 100 mg daily.  Check baseline uric acid today.  Discussed goal of getting uric acid level less than 6.  Due to recheck renal function.  Stressed the importance of alcohol cessation.  Take allopurinol and colchicine daily  x6 months and at that point we can drop off the colchicine.

## 2017-09-26 LAB — BASIC METABOLIC PANEL WITH GFR
BUN: 18 mg/dL (ref 7–25)
CALCIUM: 9.8 mg/dL (ref 8.6–10.3)
CO2: 18 mmol/L — AB (ref 20–32)
CREATININE: 1.22 mg/dL (ref 0.70–1.25)
Chloride: 109 mmol/L (ref 98–110)
GFR, EST NON AFRICAN AMERICAN: 63 mL/min/{1.73_m2} (ref >=60)
GFR, Est African American: 73 mL/min/{1.73_m2} (ref >=60)
Glucose, Bld: 110 mg/dL — ABNORMAL HIGH (ref 65–99)
Potassium: 4.3 mmol/L (ref 3.5–5.3)
Sodium: 143 mmol/L (ref 135–146)

## 2017-09-26 LAB — URIC ACID: Uric Acid, Serum: 10 mg/dL — ABNORMAL HIGH (ref 4.0–8.0)

## 2017-10-02 NOTE — Telephone Encounter (Signed)
Pt was able to get Rx. No further questions.

## 2017-10-08 ENCOUNTER — Ambulatory Visit (INDEPENDENT_AMBULATORY_CARE_PROVIDER_SITE_OTHER): Payer: 59 | Admitting: Family Medicine

## 2017-10-08 ENCOUNTER — Encounter: Payer: Self-pay | Admitting: Family Medicine

## 2017-10-08 VITALS — BP 156/98 | HR 95 | Ht 71.0 in | Wt 230.0 lb

## 2017-10-08 DIAGNOSIS — I10 Essential (primary) hypertension: Secondary | ICD-10-CM

## 2017-10-08 DIAGNOSIS — M1A0690 Idiopathic chronic gout, unspecified knee, without tophus (tophi): Secondary | ICD-10-CM

## 2017-10-08 NOTE — Progress Notes (Signed)
Edward Warren is a 62 y.o. male who presents to [place] today for left knee pain.  [name] was seen on April 22 for left knee pain and effusion thought to be due to gout flare.  Aspiration and injection revealed uric acid crystals.  He had significant improvement with steroid injection and colchicine.  Uric acid was checked recently and found to be elevated at 10 and he was recently started on allopurinol.  He is been taking allopurinol now for about 2 weeks and feeling pretty well.  Notes his knee pain is almost completely resolved.    ROS:  As above  Exam:  BP (!) 156/98   Pulse 95   Ht 5\' 11"  (1.803 m)   Wt 230 lb (104.3 kg)   BMI 32.08 kg/m  General: Well Developed, well nourished, and in no acute distress.  Neuro/Psych: Alert and oriented x3, extra-ocular muscles intact, able to move all 4 extremities, sensation grossly intact. Skin: Warm and dry, no rashes noted.  Respiratory: Not using accessory muscles, speaking in full sentences, trachea midline.  Cardiovascular: Pulses palpable, no extremity edema. Abdomen: Does not appear distended. MSK: Left knee: No significant effusion. Nontender. Normal range of motion. Retropatellar crepitations on extension. Stable ligamentous exam.  Lab Results  Component Value Date   LABURIC 10.0 (H) 09/25/2017     Assessment and Plan: 62 y.o. male with  Gout: Because of left knee pain and effusion.  Plan to continue allopurinol and colchicine prophylaxis.  Recheck uric acid level in about 1 month and adjust allopurinol as needed.  Continue colchicine prophylaxis for about 3 months.  After 3 months of colchicine prophylaxis will switch to as needed colchicine if doing well..  Follow-up elevated blood pressure with PCP.  I spent 15 minutes with this patient, greater than 50% was face-to-face time counseling regarding ddx and treatment plan.   Orders Placed This Encounter  Procedures    . Uric acid   No orders of the defined types were placed in this encounter.   Historical information moved to improve visibility of documentation.  Past Medical History:  Diagnosis Date  . Asbestosis ([place])    per patient dx by ct scan 2012  . COPD (chronic obstructive pulmonary disease) (HCC)    PFTS: 7-10: TEV1 ration of 62, FEV1 63% and FVC 80% (mod, class II)  . Emphysema of lung ([place])   . History of shingles   . Hypertension   . Personal history of colonic adenomas 07/05/2012   Past Surgical History:  Procedure Laterality Date  . COLONOSCOPY  multiple   Social History   Tobacco Use  . Smoking status: Current Every Day Smoker    Packs/day: 0.10    Years: 42.00    Pack years: 4.20    Types: Cigarettes  . Smokeless tobacco: Never Used  . Tobacco comment: Smokes 3-4 cigarettes a week 12/8  Substance Use Topics  . Alcohol use: Yes    Alcohol/week: 3.0 oz    Types: 5 Standard drinks or equivalent per week   family history includes Breast cancer in his maternal aunt; COPD in his mother; Cancer in his maternal uncle; Heart attack in his father; Stroke in his maternal grandfather.  Medications: Current Outpatient Medications  Medication Sig Dispense Refill  . allopurinol (ZYLOPRIM) 100 MG tablet Take 1 tablet (100 mg total) by mouth daily. 90 tablet 1  . colchicine 0.6 MG tablet Take 1 tablet (0.6 mg total) by mouth daily.  30 tablet 2  . fluticasone furoate-vilanterol (BREO ELLIPTA) 100-25 MCG/INH AEPB INHALE 1 PUFF BY MOUTH DAILY AS DIRECTED 60 each 0  . meloxicam (MOBIC) 15 MG tablet TAKE 1 TABLET (15 MG TOTAL) BY MOUTH DAILY. 90 tablet 2  . Olmesartan-Amlodipine-HCTZ 40-5-25 MG TABS TAKE 1 TABLET BY MOUTH DAILY. 90 tablet 1  . omeprazole (PRILOSEC) 40 MG capsule TAKE 1 CAPSULE (40 MG TOTAL) BY MOUTH DAILY. TAKE 30 MIN BEFORE FIRST MEAL OF DAY. 90 capsule 3  . PROAIR HFA 108 (90 Base) MCG/ACT inhaler INHALE 2 PUFFS INTO THE LUNGS EVERY 6 (SIX) HOURS AS NEEDED. 18 g 3    No current facility-administered medications for this visit.    Allergies  Allergen Reactions  . Prednisone Other (See Comments)    Nightmares and suicidal thoughts      Discussed warning signs or symptoms. Please see discharge instructions. Patient expresses understanding.

## 2017-10-08 NOTE — Patient Instructions (Addendum)
Thank you for coming in today. Continue both colchicine and allopurinol.  Get uric acid rechecked in about 1 month.  I will get results to you and adjust the medicine as needed.  After about 3 months we should be able to stop the colchicine and just use it as needed for gout flairs.   Food play a big impact for gout.  Reduce alcohol and red meat and seafood.   Low-Purine Diet Purines are compounds that affect the level of uric acid in your body. A low-purine diet is a diet that is low in purines. Eating a low-purine diet can prevent the level of uric acid in your body from getting too high and causing gout or kidney stones or both. What do I need to know about this diet?  Choose low-purine foods. Examples of low-purine foods are listed in the next section.  Drink plenty of fluids, especially water. Fluids can help remove uric acid from your body. Try to drink 8-16 cups (1.9-3.8 L) a day.  Limit foods high in fat, especially saturated fat, as fat makes it harder for the body to get rid of uric acid. Foods high in saturated fat include pizza, cheese, ice cream, whole milk, fried foods, and gravies. Choose foods that are lower in fat and lean sources of protein. Use olive oil when cooking as it contains healthy fats that are not high in saturated fat.  Limit alcohol. Alcohol interferes with the elimination of uric acid from your body. If you are having a gout attack, avoid all alcohol.  Keep in mind that different people's bodies react differently to different foods. You will probably learn over time which foods do or do not affect you. If you discover that a food tends to cause your gout to flare up, avoid eating that food. You can more freely enjoy foods that do not cause problems. If you have any questions about a food item, talk to your dietitian or health care provider. Which foods are low, moderate, and high in purines? The following is a list of foods that are low, moderate, and high in  purines. You can eat any amount of the foods that are low in purines. You may be able to have small amounts of foods that are moderate in purines. Ask your health care provider how much of a food moderate in purines you can have. Avoid foods high in purines. Grains  Foods low in purines: Enriched white bread, pasta, rice, cake, cornbread, popcorn.  Foods moderate in purines: Whole-grain breads and cereals, wheat germ, bran, oatmeal. Uncooked oatmeal. Dry wheat bran or wheat germ.  Foods high in purines: Pancakes, [place] toast, biscuits, muffins. Vegetables  Foods low in purines: All vegetables, except those that are moderate in purines.  Foods moderate in purines: Asparagus, cauliflower, spinach, mushrooms, green peas. Fruits  All fruits are low in purines. Meats and other Protein Foods  Foods low in purines: Eggs, nuts, peanut butter.  Foods moderate in purines: 80-90% lean beef, lamb, veal, pork, poultry, fish, eggs, peanut butter, nuts. Crab, lobster, oysters, and shrimp. Cooked dried beans, peas, and lentils.  Foods high in purines: Anchovies, sardines, herring, mussels, tuna, codfish, scallops, trout, and haddock. Berniece Salines. Organ meats (such as liver or kidney). Tripe. Game meat. Goose. Sweetbreads. Dairy  All dairy foods are low in purines. Low-fat and fat-free dairy products are best because they are low in saturated fat. Beverages  Drinks low in purines: Water, carbonated beverages, tea, coffee, cocoa.  Drinks moderate  in purines: Soft drinks and other drinks sweetened with high-fructose corn syrup. Juices. To find whether a food or drink is sweetened with high-fructose corn syrup, look at the ingredients list.  Drinks high in purines: Alcoholic beverages (such as beer). Condiments  Foods low in purines: Salt, herbs, olives, pickles, relishes, vinegar.  Foods moderate in purines: Butter, margarine, oils, mayonnaise. Fats and Oils  Foods low in purines: All types, except  gravies and sauces made with meat.  Foods high in purines: Gravies and sauces made with meat. Other Foods  Foods low in purines: Sugars, sweets, gelatin. Cake. Soups made without meat.  Foods moderate in purines: Meat-based or fish-based soups, broths, or bouillons. Foods and drinks sweetened with high-fructose corn syrup.  Foods high in purines: High-fat desserts (such as ice cream, cookies, cakes, pies, doughnuts, and chocolate). Contact your dietitian for more information on foods that are not listed here. This information is not intended to replace advice given to you by your health care provider. Make sure you discuss any questions you have with your health care provider. Document Released: 09/02/2010 Document Revised: 10/14/2015 Document Reviewed: 04/14/2013 Elsevier Interactive Patient Education  2017 [employer].

## 2017-12-10 LAB — URIC ACID: Uric Acid, Serum: 6.7 mg/dL (ref 4.0–8.0)

## 2017-12-11 ENCOUNTER — Other Ambulatory Visit: Payer: Self-pay | Admitting: Family Medicine

## 2017-12-11 MED ORDER — ALLOPURINOL 300 MG PO TABS: 300.0000 mg | ORAL_TABLET | Freq: Every day | ORAL | 1 refills | Status: DC

## 2018-02-11 ENCOUNTER — Ambulatory Visit (INDEPENDENT_AMBULATORY_CARE_PROVIDER_SITE_OTHER): Payer: 59 | Admitting: Sports Medicine

## 2018-02-11 ENCOUNTER — Encounter: Payer: Self-pay | Admitting: Sports Medicine

## 2018-02-11 DIAGNOSIS — M1712 Unilateral primary osteoarthritis, left knee: Secondary | ICD-10-CM

## 2018-02-11 NOTE — Progress Notes (Signed)
Subjective:    CC: Left knee swelling  HPI: This is a pleasant 62 year old male with a history of gout, most recent uric acid level was above 6.  For the past few days has had increasing swelling and pain in his left knee, moderate, persistent without radiation, no trauma, no mechanical symptoms.  I reviewed the past medical history, family history, social history, surgical history, and allergies today and no changes were needed.  Please see the problem list section below in epic for further details.  Past Medical History: Past Medical History:  Diagnosis Date  . Asbestosis ([place])    per patient dx by ct scan 2012  . COPD (chronic obstructive pulmonary disease) (H[facility])    PFTS: 7-10: TEV1 ration of 62, FEV1 63% and FVC 80% (mod, class II)  . Emphysema of lung ([place])   . History of shingles   . Hypertension   . Personal history of colonic adenomas 07/05/2012   Past Surgical History: Past Surgical History:  Procedure Laterality Date  . COLONOSCOPY  multiple   Social History: Social History   Socioeconomic History  . Marital status: Married    Spouse name: Not on file  . Number of children: 1  . Years of education: Not on file  . Highest education level: Not on file  Occupational History  . Occupation: disabled    Comment: Designer, jewellery  . Financial resource strain: Not on file  . Food insecurity:    Worry: Not on file    Inability: Not on file  . Transportation needs:    Medical: Not on file    Non-medical: Not on file  Tobacco Use  . Smoking status: Current Every Day Smoker    Packs/day: 0.10    Years: 42.00    Pack years: 4.20    Types: Cigarettes  . Smokeless tobacco: Never Used  . Tobacco comment: Smokes 3-4 cigarettes a week 12/8  Substance and Sexual Activity  . Alcohol use: Yes    Alcohol/week: 5.0 standard drinks    Types: 5 Standard drinks or equivalent per week  . Drug use: No  . Sexual activity: Not on file  Lifestyle  . Physical  activity:    Days per week: Not on file    Minutes per session: Not on file  . Stress: Not on file  Relationships  . Social connections:    Talks on phone: Not on file    Gets together: Not on file    Attends religious service: Not on file    Active member of club or organization: Not on file    Attends meetings of clubs or organizations: Not on file    Relationship status: Not on file  Other Topics Concern  . Not on file  Social History Narrative   No regular exercise.    Family History: Family History  Problem Relation Age of Onset  . Stroke Maternal Grandfather   . Breast cancer Maternal Aunt   . Cancer Maternal Uncle        throat  . Colon cancer Neg Hx   . COPD Mother   . Heart attack Father    Allergies: Allergies  Allergen Reactions  . Prednisone Other (See Comments)    Nightmares and suicidal thoughts   Medications: See med rec.  Review of Systems: No fevers, chills, night sweats, weight loss, chest pain, or shortness of breath.   Objective:    General: Well Developed, well nourished, and in no acute  distress.  Neuro: Alert and oriented x3, extra-ocular muscles intact, sensation grossly intact.  HEENT: Normocephalic, atraumatic, pupils equal round reactive to light, neck supple, no masses, no lymphadenopathy, thyroid nonpalpable.  Skin: Warm and dry, no rashes. Cardiac: Regular rate and rhythm, no murmurs rubs or gallops, no lower extremity edema.  Respiratory: Clear to auscultation bilaterally. Not using accessory muscles, speaking in full sentences. Left knee: Swollen, tense effusion.  Procedure: Real-time Ultrasound Guided aspiration/injection of left knee Device: GE Logiq E  Verbal informed consent obtained.  Time-out conducted.  Noted no overlying erythema, induration, or other signs of local infection.  Skin prepped in a sterile fashion.  Local anesthesia: Topical Ethyl chloride.  With sterile technique and under real time ultrasound guidance: I  advanced an 18-gauge needle into the suprapatellar recess and aspirated 73 cc of cloudy, straw-colored fluid, syringe switched and 1 cc kenalog 40, 2 cc lidocaine, 2 cc bupivacaine injected easily. Completed without difficulty  Pain immediately resolved suggesting accurate placement of the medication.  Advised to call if fevers/chills, erythema, induration, drainage, or persistent bleeding.  Images permanently stored and available for review in the ultrasound unit.  Impression: Technically successful ultrasound guided injection.  Impression and Recommendations:    Primary and gouty osteoarthritis of left knee 73 cc aspiration with injection. Strap with compressive dressing. Allopurinol was increased a month or so ago, rechecking uric acid levels in a couple of weeks when the flare resolves. Goal = less than 6. ___________________________________________ Gwen Her. Dianah Field, M.D., ABFM., CAQSM. Primary Care and [place] Instructor of [place] [employer] [facility] [employer]

## 2018-02-11 NOTE — Assessment & Plan Note (Signed)
73 cc aspiration with injection. Strap with compressive dressing. Allopurinol was increased a month or so ago, rechecking uric acid levels in a couple of weeks when the flare resolves. Goal = less than 6.

## 2018-03-18 ENCOUNTER — Encounter: Payer: Self-pay | Admitting: Internal Medicine

## 2018-03-28 ENCOUNTER — Encounter: Payer: Self-pay | Admitting: Family Medicine

## 2018-03-28 ENCOUNTER — Ambulatory Visit (INDEPENDENT_AMBULATORY_CARE_PROVIDER_SITE_OTHER): Payer: 59 | Admitting: Family Medicine

## 2018-03-28 VITALS — BP 152/80 | HR 92 | Ht 70.0 in | Wt 235.0 lb

## 2018-03-28 DIAGNOSIS — J449 Chronic obstructive pulmonary disease, unspecified: Secondary | ICD-10-CM | POA: Diagnosis not present

## 2018-03-28 DIAGNOSIS — I10 Essential (primary) hypertension: Secondary | ICD-10-CM | POA: Diagnosis not present

## 2018-03-28 DIAGNOSIS — M1A09X0 Idiopathic chronic gout, multiple sites, without tophus (tophi): Secondary | ICD-10-CM | POA: Diagnosis not present

## 2018-03-28 DIAGNOSIS — N529 Male erectile dysfunction, unspecified: Secondary | ICD-10-CM

## 2018-03-28 DIAGNOSIS — Z23 Encounter for immunization: Secondary | ICD-10-CM | POA: Diagnosis not present

## 2018-03-28 MED ORDER — AMBULATORY NON FORMULARY MEDICATION: 0 refills | Status: DC

## 2018-03-28 MED ORDER — ALBUTEROL SULFATE (2.5 MG/3ML) 0.083% IN NEBU: 2.5000 mg | INHALATION_SOLUTION | Freq: Four times a day (QID) | RESPIRATORY_TRACT | 12 refills | Status: DC | PRN

## 2018-03-28 MED ORDER — SILDENAFIL CITRATE 20 MG PO TABS: 40.0000 mg | ORAL_TABLET | Freq: Every day | ORAL | 2 refills | Status: DC | PRN

## 2018-03-28 MED ORDER — OLMESARTAN-AMLODIPINE-HCTZ 40-5-25 MG PO TABS: 1.0000 | ORAL_TABLET | Freq: Every day | ORAL | 1 refills | Status: DC

## 2018-03-28 NOTE — Patient Instructions (Signed)
Follow up in 2 weeks for nurse BP check

## 2018-03-28 NOTE — Progress Notes (Signed)
Subjective:    CC:   HPI:  Hypertension- Pt denies chest pain, SOB, dizziness, or heart palpitations.  Taking meds as directed w/o problems.  Denies medication side effects.    COPD-using Breo and albuterol.  He would actually like to get a prescription for nebulizer machine to use at home.  Prescription printed for nebulizer machine.    Gout-he was seen earlier this year for a severe episode of gout in his knee.  His allopurinol was increased to 300 mg in July and he is due to repeat his uric acid level.  Last level was almost at goal at 6.7.  He c/o of ED sxs.  He rates his confidence in having a reaction as low.  His erections are only hard enough to penetrate about half of the time.  He just feels like as he is gotten older it is become more difficult and he is interested in taking medication to help with this.  Past medical history, Surgical history, Family history not pertinant except as noted below, Social history, Allergies, and medications have been entered into the medical record, reviewed, and corrections made.   Review of Systems: No fevers, chills, night sweats, weight loss, chest pain, or shortness of breath.   Objective:    General: Well Developed, well nourished, and in no acute distress.  Neuro: Alert and oriented x3, extra-ocular muscles intact, sensation grossly intact.  HEENT: Normocephalic, atraumatic  Skin: Warm and dry, no rashes. Cardiac: Regular rate and rhythm, no murmurs rubs or gallops, no lower extremity edema.  Respiratory: Clear to auscultation bilaterally. Not using accessory muscles, speaking in full sentences.   Impression and Recommendations:    HTN -for CMP and lipid level.  We will get this up-to-date.  Gout  -he is doing much better overall and has not had any recent flares.  Is been tolerating the allopurinol 300 mg daily well.  Due to repeat uric acid so we can make sure that his level is less than 6.0.  COPD - Can try to get it filled through  his pharmacy if is not covered by his insurance through them and please just give [place] a call back and we can [facility]  ED-  Will try Viagra.  Discussed side effects.  Up immediately if any chest pain lightheadedness or dizziness.  ED questionnaire score of 14 which is significant.

## 2018-04-03 LAB — COMPLETE METABOLIC PANEL WITH GFR
AG RATIO: 1.6 (calc) (ref 1.0–2.5)
ALBUMIN MSPROF: 4.4 g/dL (ref 3.6–5.1)
ALT: 20 U/L (ref 9–46)
AST: 21 U/L (ref 10–35)
Alkaline phosphatase (APISO): 80 U/L (ref 40–115)
BILIRUBIN TOTAL: 0.8 mg/dL (ref 0.2–1.2)
BUN: 12 mg/dL (ref 7–25)
CALCIUM: 9.8 mg/dL (ref 8.6–10.3)
CHLORIDE: 104 mmol/L (ref 98–110)
CO2: 25 mmol/L (ref 20–32)
Creat: 1.11 mg/dL (ref 0.70–1.25)
GFR, EST AFRICAN AMERICAN: 81 mL/min/{1.73_m2} (ref >=60)
GFR, EST NON AFRICAN AMERICAN: 70 mL/min/{1.73_m2} (ref >=60)
Globulin: 2.7 g/dL (calc) (ref 1.9–3.7)
Glucose, Bld: 111 mg/dL — ABNORMAL HIGH (ref 65–99)
POTASSIUM: 3.8 mmol/L (ref 3.5–5.3)
Sodium: 139 mmol/L (ref 135–146)
TOTAL PROTEIN: 7.1 g/dL (ref 6.1–8.1)

## 2018-04-03 LAB — LIPID PANEL
CHOL/HDL RATIO: 2.3 (calc) (ref <5.0)
Cholesterol: 167 mg/dL (ref <200)
HDL: 74 mg/dL (ref >40)
LDL CHOLESTEROL (CALC): 72 mg/dL
NON-HDL CHOLESTEROL (CALC): 93 mg/dL (ref <130)
TRIGLYCERIDES: 125 mg/dL (ref <150)

## 2018-04-03 LAB — URIC ACID: URIC ACID, SERUM: 9.9 mg/dL — AB (ref 4.0–8.0)

## 2018-04-11 ENCOUNTER — Ambulatory Visit (INDEPENDENT_AMBULATORY_CARE_PROVIDER_SITE_OTHER): Payer: 59 | Admitting: Family Medicine

## 2018-04-11 VITALS — BP 144/86 | HR 96 | Ht 70.0 in | Wt 231.0 lb

## 2018-04-11 DIAGNOSIS — I10 Essential (primary) hypertension: Secondary | ICD-10-CM

## 2018-04-11 MED ORDER — OLMESARTAN-AMLODIPINE-HCTZ 40-10-25 MG PO TABS: 1.0000 | ORAL_TABLET | Freq: Every day | ORAL | 2 refills | Status: DC

## 2018-04-11 NOTE — Progress Notes (Signed)
Agree with documentation as above. Rx sent.  Beatrice Lecher, MD

## 2018-04-11 NOTE — Progress Notes (Signed)
Patient is here today for a blood pressure check. Patient states he has lost weight since last visit. Last visit was on 03/28/2018 his weight was 235lb today his weight is 231lb. Patient states he is currently taking Olmesartan-amplidipine-HCTZ 40-5-25mg  daily. Patient's blood pressure today was 177/98 pulse 80. I allowed patient to sit for about ten minutes and rechecked blood pressure. Second blood pressure reading was 144/86.  Allopurinol was discussed with patient as he had questions about the medication. Low purine diet information was printed and given to patient as well. Patient also states he is no longer taking the Viagra that was prescribed for him at his last visit. Patient states he feels as though the mediation did not work well for him. Patient also states he believes the medication caused him to have dry mouth at night when taken.   I discussed everything with patient's provider, and patient's Olmesartan-amplodipine-HCTZ will be increased to 40/10/25. Medication will be sent to pharmacy. Patient was advised to make another nurse visit in 2-3 weeks for another blood pressure check. Patient verbalized understanding. No further questions or concerns at this time. Casilda Carls, CMA.

## 2018-04-25 ENCOUNTER — Ambulatory Visit: Payer: Self-pay

## 2018-05-02 ENCOUNTER — Ambulatory Visit (INDEPENDENT_AMBULATORY_CARE_PROVIDER_SITE_OTHER): Payer: 59 | Admitting: Family Medicine

## 2018-05-02 VITALS — BP 134/80 | HR 69 | Temp 98.4°F | Wt 234.0 lb

## 2018-05-02 DIAGNOSIS — I10 Essential (primary) hypertension: Secondary | ICD-10-CM | POA: Diagnosis not present

## 2018-05-02 NOTE — Progress Notes (Signed)
Hypertension-blood pressure much better today.  He had increased his Tribenzor dose.  Ultimately like to see the systolic less than 979 but it is a big improvement.  We will continue with current regimen and keep regular follow-up.  Beatrice Lecher, MD

## 2018-05-02 NOTE — Progress Notes (Signed)
Patient in today for BP check. BP at last visit was144/86. Patient reports he is taking his new medication now for about 1 1/2 weeks.. BP in the office today is134/80. Pt denies headaches, blurred vision, chest pain and shortness of breath. Pt is to continue with current medications, Provider will be notified . If any changes Pt will be called and advised.

## 2018-06-25 ENCOUNTER — Other Ambulatory Visit: Payer: Self-pay | Admitting: Sports Medicine

## 2018-07-17 ENCOUNTER — Other Ambulatory Visit: Payer: Self-pay | Admitting: Family Medicine

## 2018-07-29 ENCOUNTER — Encounter: Payer: Self-pay | Admitting: Internal Medicine

## 2018-08-05 ENCOUNTER — Ambulatory Visit (AMBULATORY_SURGERY_CENTER): Payer: Self-pay | Admitting: *Deleted

## 2018-08-05 ENCOUNTER — Encounter: Payer: Self-pay | Admitting: Internal Medicine

## 2018-08-05 ENCOUNTER — Other Ambulatory Visit: Payer: Self-pay

## 2018-08-05 VITALS — Temp 97.5°F | Ht 71.0 in | Wt 237.0 lb

## 2018-08-05 DIAGNOSIS — Z860100 Personal history of colon polyps, unspecified: Secondary | ICD-10-CM

## 2018-08-05 NOTE — Progress Notes (Signed)
Patient denies any allergies to eggs or soy. Patient denies any problems with anesthesia/sedation. Patient denies any oxygen use at home. Patient denies taking any diet/weight loss medications or blood thinners. EMMI education offered, pt declined.  

## 2018-08-14 ENCOUNTER — Encounter: Payer: Self-pay | Admitting: Internal Medicine

## 2018-08-16 ENCOUNTER — Other Ambulatory Visit: Payer: Self-pay

## 2018-08-16 ENCOUNTER — Ambulatory Visit (INDEPENDENT_AMBULATORY_CARE_PROVIDER_SITE_OTHER): Payer: 59 | Admitting: Sports Medicine

## 2018-08-16 DIAGNOSIS — M1712 Unilateral primary osteoarthritis, left knee: Secondary | ICD-10-CM | POA: Diagnosis not present

## 2018-08-16 NOTE — Assessment & Plan Note (Signed)
Aspiration and injection. Fluid was cloudy suggesting gout flare. He has had some scotch lately, when he returns we can recheck his uric acid levels, he does claim he is taking his allopurinol as directed, previous uric acid levels were in the nines, goal is less than 6.

## 2018-08-16 NOTE — Progress Notes (Signed)
Subjective:    CC: Left knee swelling  HPI: Edward Warren is a pleasant 63 year old male, he has osteoarthritis and gout, traditionally in his left knee.  We last did an aspiration of over 70 mL of fluid, as well as an injection sometime last year.  He is taking his allopurinol however uric acid levels at his last check were over 9.  More recently he had some scotch, and developed swelling and pain in his left knee, severe, persistent, localized without radiation.  I reviewed the past medical history, family history, social history, surgical history, and allergies today and no changes were needed.  Please see the problem list section below in epic for further details.  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[place])    per patient dx by ct scan 2012   COPD (chronic obstructive pulmonary disease) (HCC)    PFTS: 7-10: TEV1 ration of 62, FEV1 63% and FVC 80% (mod, class II)   Emphysema of lung (HCC)    History of shingles    Hypertension    Personal history of colonic adenomas 07/05/2012   Past Surgical History: Past Surgical History:  Procedure Laterality Date   COLONOSCOPY  multiple   Social History: Social History   Socioeconomic History   Marital status: Married    Spouse name: Not on file   Number of children: 1   Years of education: Not on file   Highest education level: Not on file  Occupational History   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   Food insecurity:    Worry: Not on file    Inability: Not on file   Transportation needs:    Medical: Not on file    Non-medical: Not on file  Tobacco Use   Smoking status: Current Every Day Smoker    Packs/day: 0.50    Years: 42.00    Pack years: 21.00    Types: Cigarettes   Smokeless tobacco: Never Used   Tobacco comment: Smokes 3-4 cigarettes a week 12/8  Substance and Sexual Activity   Alcohol use: Yes   Alcohol/week: 3.0 standard drinks    Types: 3 Standard drinks or equivalent per week   Drug use: No   Sexual activity: Not on file  Lifestyle   Physical activity:    Days per week: Not on file    Minutes per session: Not on file   Stress: Not on file  Relationships   Social connections:    Talks on phone: Not on file    Gets together: Not on file    Attends religious service: Not on file    Active member of club or organization: Not on file    Attends meetings of clubs or organizations: Not on file    Relationship status: Not on file  Other Topics Concern   Not on file  Social History Narrative   No regular exercise.    Family History: Family History  Problem Relation Age of Onset   COPD Mother    Heart attack Father    Stroke Maternal Grandfather    Breast cancer Maternal Aunt    Cancer Maternal Uncle        throat   Colon cancer Neg Hx    Esophageal cancer Neg Hx    Colon polyps Neg Hx    Rectal cancer Neg Hx    Stomach cancer Neg Hx  Allergies: Allergies  Allergen Reactions   Prednisone Other (See Comments)    Nightmares and suicidal thoughts   Medications: See med rec.  Review of Systems: No fevers, chills, night sweats, weight loss, chest pain, or shortness of breath.   Objective:    General: Well Developed, well nourished, and in no acute distress.  Neuro: Alert and oriented x3, extra-ocular muscles intact, sensation grossly intact.  HEENT: Normocephalic, atraumatic, pupils equal round reactive to light, neck supple, no masses, no lymphadenopathy, thyroid nonpalpable.  Skin: Warm and dry, no rashes. Cardiac: Regular rate and rhythm, no murmurs rubs or gallops, no lower extremity edema.  Respiratory: Clear to auscultation bilaterally. Not using accessory muscles, speaking in full sentences. Left knee: Swollen, effusion with a palpable fluid wave. Palpation normal with no warmth or joint line tenderness or patellar tenderness or condyle  tenderness. ROM normal in flexion and extension and lower leg rotation. Ligaments with solid consistent endpoints including ACL, PCL, LCL, MCL. Negative Mcmurray's and provocative meniscal tests. Non painful patellar compression. Patellar and quadriceps tendons unremarkable. Hamstring and quadriceps strength is normal.  Procedure: Real-time Ultrasound Guided aspiration/injection of left knee Device: GE Logiq E  Verbal informed consent obtained.  Time-out conducted.  Noted no overlying erythema, induration, or other signs of local infection.  Skin prepped in a sterile fashion.  Local anesthesia: Topical Ethyl chloride.  With sterile technique and under real time ultrasound guidance:  Using 18-gauge needle advanced into the suprapatellar recess, aspirated 33 cc of cloudy, straw-colored fluid, syringe switched and 1 cc Kenalog 40, 2 cc lidocaine, 2 cc bupivacaine injected easily Completed without difficulty  Pain immediately resolved suggesting accurate placement of the medication.  Advised to call if fevers/chills, erythema, induration, drainage, or persistent bleeding.  Images permanently stored and available for review in the ultrasound unit.  Impression: Technically successful ultrasound guided injection.  Impression and Recommendations:    Primary and gouty osteoarthritis of left knee Aspiration and injection. Fluid was cloudy suggesting gout flare. He has had some scotch lately, when he returns we can recheck his uric acid levels, he does claim he is taking his allopurinol as directed, previous uric acid levels were in the nines, goal is less than 6.   ___________________________________________ Gwen Her. Dianah Field, M.D., ABFM., CAQSM. Primary Care and Sports Medicine [facility]  Adjunct Professor of [place] of [facility] of Medicine

## 2018-09-16 ENCOUNTER — Ambulatory Visit: Payer: Self-pay | Admitting: Sports Medicine

## 2018-09-16 ENCOUNTER — Ambulatory Visit: Payer: Self-pay | Admitting: Family Medicine

## 2018-09-23 ENCOUNTER — Telehealth: Payer: Self-pay | Admitting: *Deleted

## 2018-09-23 NOTE — Telephone Encounter (Signed)
Pt. Was unable to reschedule today and stated "I will call back tomorrow" to reschedule.

## 2018-09-25 ENCOUNTER — Other Ambulatory Visit: Payer: Self-pay | Admitting: Family Medicine

## 2018-09-25 DIAGNOSIS — R1013 Epigastric pain: Secondary | ICD-10-CM

## 2018-09-26 ENCOUNTER — Ambulatory Visit: Payer: Self-pay | Admitting: Family Medicine

## 2018-10-09 ENCOUNTER — Other Ambulatory Visit: Payer: Self-pay | Admitting: Pulmonary Disease

## 2018-10-11 ENCOUNTER — Encounter: Payer: Self-pay | Admitting: Family Medicine

## 2018-10-11 ENCOUNTER — Ambulatory Visit (INDEPENDENT_AMBULATORY_CARE_PROVIDER_SITE_OTHER): Payer: 59 | Admitting: Family Medicine

## 2018-10-11 VITALS — BP 132/74 | HR 89 | Ht 70.0 in

## 2018-10-11 DIAGNOSIS — M1A0690 Idiopathic chronic gout, unspecified knee, without tophus (tophi): Secondary | ICD-10-CM | POA: Diagnosis not present

## 2018-10-11 DIAGNOSIS — Z1211 Encounter for screening for malignant neoplasm of colon: Secondary | ICD-10-CM | POA: Diagnosis not present

## 2018-10-11 DIAGNOSIS — I10 Essential (primary) hypertension: Secondary | ICD-10-CM | POA: Diagnosis not present

## 2018-10-11 DIAGNOSIS — J449 Chronic obstructive pulmonary disease, unspecified: Secondary | ICD-10-CM | POA: Diagnosis not present

## 2018-10-11 MED ORDER — ALLOPURINOL 300 MG PO TABS: 300.0000 mg | ORAL_TABLET | Freq: Every day | ORAL | 3 refills | Status: DC

## 2018-10-11 MED ORDER — FLUTICASONE FUROATE-VILANTEROL 100-25 MCG/INH IN AEPB: INHALATION_SPRAY | RESPIRATORY_TRACT | 11 refills | Status: DC

## 2018-10-11 MED ORDER — OLMESARTAN-AMLODIPINE-HCTZ 40-10-25 MG PO TABS: 1.0000 | ORAL_TABLET | Freq: Every day | ORAL | 1 refills | Status: DC

## 2018-10-11 NOTE — Progress Notes (Signed)
Virtual Visit via Video Note  I connected with Edward Warren on 10/11/18 at  4:20 PM EDT by a video enabled telemedicine application and verified that I am speaking with the correct person using two identifiers.   I discussed the limitations of evaluation and management by telemedicine and the availability of in person appointments. The patient expressed understanding and agreed to proceed.  Subjective:    CC: 28-month follow-up.  HPI:  Hypertension- Pt denies chest pain, SOB, dizziness, or heart palpitations.  Taking meds as directed w/o problems.  Denies medication side effects.    F/U COPD - Pt is asking for refill on Breo he usually gets this from his pulmonologist, but is ok if Dr. Madilyn Fireman refills for this or something that is compatible to this.  Denies  any recent flares or exacerbations.  F/U Gout -last year his dose was adjusted because of a severe gout flare.  Though on his last uric acid level it was quite elevated at 9.9 it was not at goal.  We called him and encouraged him to make sure that he was taking his allopurinol consistently.  Says he is been taking it every day since then.   Past medical history, Surgical history, Family history not pertinant except as noted below, Social history, Allergies, and medications have been entered into the medical record, reviewed, and corrections made.   Review of Systems: No fevers, chills, night sweats, weight loss, chest pain, or shortness of breath.   Objective:    General: Speaking clearly in complete sentences without any shortness of breath.  Alert and oriented x3.  Normal judgment. No apparent acute distress.    Impression and Recommendations:    Hypertension-blood pressure looks fantastic.  Just continue to monitor periodically at home.  He is due for labs so he will plan to go to the lab probably next week to have those checked.  COPD-no recent flares or exacerbations.  He would like me to take over writing his  prescription for his Brio he says is actually pretty well covered on his insurance plan.  New prescription sent to pharmacy.  Gout-last flare was about a month ago.  He is taking his allopurinol daily so definitely will check his uric acid level so that we can adjust his dose if needed.      I discussed the assessment and treatment plan with the patient. The patient was provided an opportunity to ask questions and all were answered. The patient agreed with the plan and demonstrated an understanding of the instructions.   The patient was advised to call back or seek an in-person evaluation if the symptoms worsen or if the condition fails to improve as anticipated.   Beatrice Lecher, MD

## 2018-10-11 NOTE — Progress Notes (Signed)
Pt is asking for refill on Breo he usually gets this from his pulmonologist, but is ok if Dr. Madilyn Fireman refills for this or something that is compatible to this.  Maryruth Eve, [place]ahoma Crocker, CMA

## 2018-10-30 ENCOUNTER — Ambulatory Visit (INDEPENDENT_AMBULATORY_CARE_PROVIDER_SITE_OTHER): Payer: 59 | Admitting: Sports Medicine

## 2018-10-30 ENCOUNTER — Encounter: Payer: Self-pay | Admitting: Sports Medicine

## 2018-10-30 DIAGNOSIS — M109 Gout, unspecified: Secondary | ICD-10-CM | POA: Insufficient documentation

## 2018-10-30 DIAGNOSIS — M10071 Idiopathic gout, right ankle and foot: Secondary | ICD-10-CM | POA: Diagnosis not present

## 2018-10-30 NOTE — Progress Notes (Signed)
Subjective:    CC: Right foot swelling  HPI: Edward Warren is a pleasant 63 year old male, recently he had a bit of whiskey, as well as some spaghetti with meat balls, he then rapidly developed swelling and pain in his right ankle, upper foot.  It has progressed to the point where he has difficulty bearing weight.  His most recent uric acid levels were 9.9, he does endorse compliance with allopurinol 300 mg daily.  Symptoms are severe, localized without radiation.  I reviewed the past medical history, family history, social history, surgical history, and allergies today and no changes were needed.  Please see the problem list section below in epic for further details.  Past Medical History: Past Medical History:  Diagnosis Date  . 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 COPD (chronic obstructive pulmonary disease) (HCC)    PFTS: 7-10: TEV1 ration of 62, FEV1 63% and FVC 80% (mod, class II)  . Emphysema of lung ([place])   . History of shingles   . Hypertension   . Personal history of colonic adenomas 07/05/2012   Past Surgical History: Past Surgical History:  Procedure Laterality Date  . COLONOSCOPY  multiple   Social History: Social History   Socioeconomic History  . Marital status: Married    Spouse name: Not on file  . Number of children: 1  . Years of education: Not on file  . Highest education level: Not on file  Occupational History  . Occupation: disabled    Comment: Designer, jewellery  . Financial resource strain: Not on file  . Food insecurity:    Worry: Not on file    Inability: Not on file  . Transportation needs:    Medical: Not on file    Non-medical: Not on file  Tobacco Use  . Smoking status: Current Every Day Smoker    Packs/day: 0.50    Years: 42.00    Pack years: 21.00    Types: Cigarettes  . Smokeless tobacco: Never Used  . Tobacco comment: Smokes 3-4 cigarettes a week 12/8  Substance and Sexual Activity  . Alcohol use: Yes   Alcohol/week: 3.0 standard drinks    Types: 3 Standard drinks or equivalent per week  . Drug use: No  . Sexual activity: Not on file  Lifestyle  . Physical activity:    Days per week: Not on file    Minutes per session: Not on file  . Stress: Not on file  Relationships  . Social connections:    Talks on phone: Not on file    Gets together: Not on file    Attends religious service: Not on file    Active member of club or organization: Not on file    Attends meetings of clubs or organizations: Not on file    Relationship status: Not on file  Other Topics Concern  . Not on file  Social History Narrative   No regular exercise.    Family History: Family History  Problem Relation Age of Onset  . COPD Mother   . Heart attack Father   . Stroke Maternal Grandfather   . Breast cancer Maternal Aunt   . Cancer Maternal Uncle        throat  . Colon cancer Neg Hx   . Esophageal cancer Neg Hx   . Colon polyps Neg Hx   . Rectal cancer Neg Hx   . Stomach cancer Neg Hx    Allergies: Allergies  Allergen Reactions  . Prednisone Other (See Comments)    Nightmares and suicidal thoughts   Medications: See med rec.  Review of Systems: No fevers, chills, night sweats, weight loss, chest pain, or shortness of breath.   Objective:    General: Well Developed, well nourished, and in no acute distress.  Neuro: Alert and oriented x3, extra-ocular muscles intact, sensation grossly intact.  HEENT: Normocephalic, atraumatic, pupils equal round reactive to light, neck supple, no masses, no lymphadenopathy, thyroid nonpalpable.  Skin: Warm and dry, no rashes. Cardiac: Regular rate and rhythm, no murmurs rubs or gallops, no lower extremity edema.  Respiratory: Clear to auscultation bilaterally. Not using accessory muscles, speaking in full sentences. Right foot: Visibly swollen, hot, warm, red.  Tender to palpation over the proximal, lateral midfoot.  Procedure: Real-time Ultrasound Guided  injection of the right calcaneocuboid joint Device: GE Logiq E  Verbal informed consent obtained.  Time-out conducted.  Noted no overlying erythema, induration, or other signs of local infection.  Skin prepped in a sterile fashion.  Local anesthesia: Topical Ethyl chloride.  With sterile technique and under real time ultrasound guidance:  Noted significant intertarsal effusions most prominent at the calcaneocuboid joint from a lateral approach, I guided a 25-gauge needle into the joint and injected 1 cc Kenalog 40, 1 cc lidocaine. Completed without difficulty  Pain immediately resolved suggesting accurate placement of the medication.  Advised to call if fevers/chills, erythema, induration, drainage, or persistent bleeding.  Images permanently stored and available for review in the ultrasound unit.  Impression: Technically successful ultrasound guided injection.  Impression and Recommendations:    Gout of right ankle Last uric acid levels were 9.9. Injection as above. Recheck uric acid levels in a couple of weeks, if above 5 we will increase allopurinol to twice daily and then recheck 1 month later. Return to see me in a month.   ___________________________________________ Gwen Her. Dianah Field, M.D., ABFM., CAQSM. Primary Care and Sports Medicine [place] [facility]  Adjunct Professor of [place] [employer] [facility] of Medicine

## 2018-10-30 NOTE — Assessment & Plan Note (Signed)
Last uric acid levels were 9.9. Injection as above. Recheck uric acid levels in a couple of weeks, if above 5 we will increase allopurinol to twice daily and then recheck 1 month later. Return to see me in a month.

## 2018-11-15 ENCOUNTER — Encounter: Payer: Self-pay | Admitting: Internal Medicine

## 2018-11-25 DIAGNOSIS — M1A0690 Idiopathic chronic gout, unspecified knee, without tophus (tophi): Secondary | ICD-10-CM | POA: Diagnosis not present

## 2018-11-25 DIAGNOSIS — I10 Essential (primary) hypertension: Secondary | ICD-10-CM | POA: Diagnosis not present

## 2018-11-26 LAB — COMPLETE METABOLIC PANEL WITH GFR
AG Ratio: 1.7 (calc) (ref 1.0–2.5)
ALT: 29 U/L (ref 9–46)
AST: 38 U/L — ABNORMAL HIGH (ref 10–35)
Albumin: 4.3 g/dL (ref 3.6–5.1)
Alkaline phosphatase (APISO): 62 U/L (ref 35–144)
BUN: 16 mg/dL (ref 7–25)
CO2: 23 mmol/L (ref 20–32)
Calcium: 9.7 mg/dL (ref 8.6–10.3)
Chloride: 105 mmol/L (ref 98–110)
Creat: 0.96 mg/dL (ref 0.70–1.25)
GFR, Est African American: 96 mL/min/{1.73_m2} (ref >=60)
GFR, Est Non African American: 83 mL/min/{1.73_m2} (ref >=60)
Globulin: 2.6 g/dL (calc) (ref 1.9–3.7)
Glucose, Bld: 115 mg/dL — ABNORMAL HIGH (ref 65–99)
Potassium: 3.9 mmol/L (ref 3.5–5.3)
Sodium: 138 mmol/L (ref 135–146)
Total Bilirubin: 0.8 mg/dL (ref 0.2–1.2)
Total Protein: 6.9 g/dL (ref 6.1–8.1)

## 2018-11-26 LAB — URIC ACID: Uric Acid, Serum: 9 mg/dL — ABNORMAL HIGH (ref 4.0–8.0)

## 2018-11-27 ENCOUNTER — Ambulatory Visit (INDEPENDENT_AMBULATORY_CARE_PROVIDER_SITE_OTHER): Payer: Medicare Other | Admitting: Sports Medicine

## 2018-11-27 ENCOUNTER — Encounter: Payer: Self-pay | Admitting: Sports Medicine

## 2018-11-27 ENCOUNTER — Other Ambulatory Visit: Payer: Self-pay

## 2018-11-27 DIAGNOSIS — M1A0690 Idiopathic chronic gout, unspecified knee, without tophus (tophi): Secondary | ICD-10-CM | POA: Diagnosis not present

## 2018-11-27 DIAGNOSIS — M10071 Idiopathic gout, right ankle and foot: Secondary | ICD-10-CM

## 2018-11-27 MED ORDER — INDOMETHACIN 50 MG PO CAPS: 50.0000 mg | ORAL_CAPSULE | Freq: Two times a day (BID) | ORAL | 0 refills | Status: DC

## 2018-11-27 MED ORDER — ALLOPURINOL 300 MG PO TABS: ORAL_TABLET | ORAL | 3 refills | Status: DC

## 2018-11-27 NOTE — Assessment & Plan Note (Signed)
Uric acid levels improved from 9.9-9 with 300 of allopurinol daily. We did a calcaneocuboid joint injection at the last visit that resolved his right-sided foot symptoms in 1 day. Increasing to twice daily. Adding indomethacin to use during flares, discontinue meloxicam. Recheck uric acid levels in 1 month. We can potentially go up to 800 total milligrams daily of indomethacin but I would likely switch to Zurampic if insufficient improvement with allopurinol 300 twice daily.

## 2018-11-27 NOTE — Progress Notes (Signed)
Subjective:    CC: Follow-up  HPI: Edward Warren returns, he is a pleasant 63 year old male with gout, we did a right calcaneocuboid joint injection at the last visit for an acute gout flare, symptoms resolved in 24 hours.  His uric acid levels decreased from 9.9-9.0 with allopurinol 300 daily, he tells me he is very consistent with taking the medication.  Overall he feels good, he does have a bit of swelling in his left ankle.  I reviewed the past medical history, family history, social history, surgical history, and allergies today and no changes were needed.  Please see the problem list section below in epic for further details.  Past Medical History: Past Medical History:  Diagnosis Date  . Asbestosis ([place])    per patient dx by ct scan 2012  . COPD (chronic obstructive pulmonary disease) (HCC)    PFTS: 7-10: TEV1 ration of 62, FEV1 63% and FVC 80% (mod, class II)  . Emphysema of lung ([place])   . History of shingles   . Hypertension   . Personal history of colonic adenomas 07/05/2012   Past Surgical History: Past Surgical History:  Procedure Laterality Date  . COLONOSCOPY  multiple   Social History: Social History   Socioeconomic History  . Marital status: Married    Spouse name: Not on file  . Number of children: 1  . Years of education: Not on file  . Highest education level: Not on file  Occupational History  . Occupation: disabled    Comment: Designer, jewellery  . Financial resource strain: Not on file  . Food insecurity    Worry: Not on file    Inability: Not on file  . Transportation needs    Medical: Not on file    Non-medical: Not on file  Tobacco Use  . Smoking status: Current Every Day Smoker    Packs/day: 0.50    Years: 42.00    Pack years: 21.00    Types: Cigarettes  . Smokeless tobacco: Never Used  . Tobacco comment: Smokes 3-4 cigarettes a week 12/8  Substance and Sexual Activity  . Alcohol use: Yes    Alcohol/week: 3.0 standard drinks   Types: 3 Standard drinks or equivalent per week  . Drug use: No  . Sexual activity: Not on file  Lifestyle  . Physical activity    Days per week: Not on file    Minutes per session: Not on file  . Stress: Not on file  Relationships  . Social Herbalist on phone: Not on file    Gets together: Not on file    Attends religious service: Not on file    Active member of club or organization: Not on file    Attends meetings of clubs or organizations: Not on file    Relationship status: Not on file  Other Topics Concern  . Not on file  Social History Narrative   No regular exercise.    Family History: Family History  Problem Relation Age of Onset  . COPD Mother   . Heart attack Father   . Stroke Maternal Grandfather   . Breast cancer Maternal Aunt   . Cancer Maternal Uncle        throat  . Colon cancer Neg Hx   . Esophageal cancer Neg Hx   . Colon polyps Neg Hx   . Rectal cancer Neg Hx   . Stomach cancer Neg Hx    Allergies: Allergies  Allergen Reactions  .  Prednisone Other (See Comments)    Nightmares and suicidal thoughts   Medications: See med rec.  Review of Systems: No fevers, chills, night sweats, weight loss, chest pain, or shortness of breath.   Objective:    General: Well Developed, well nourished, and in no acute distress.  Neuro: Alert and oriented x3, extra-ocular muscles intact, sensation grossly intact.  HEENT: Normocephalic, atraumatic, pupils equal round reactive to light, neck supple, no masses, no lymphadenopathy, thyroid nonpalpable.  Skin: Warm and dry, no rashes. Cardiac: Regular rate and rhythm, no murmurs rubs or gallops, no lower extremity edema.  Respiratory: Clear to auscultation bilaterally. Not using accessory muscles, speaking in full sentences.  Impression and Recommendations:    Gout of right ankle Uric acid levels improved from 9.9-9 with 300 of allopurinol daily. We did a calcaneocuboid joint injection at the last visit  that resolved his right-sided foot symptoms in 1 day. Increasing to twice daily. Adding indomethacin to use during flares, discontinue meloxicam. Recheck uric acid levels in 1 month. We can potentially go up to 800 total milligrams daily of indomethacin but I would likely switch to Zurampic if insufficient improvement with allopurinol 300 twice daily.   ___________________________________________ Edward Warren. Dianah Field, M.D., ABFM., CAQSM. Primary Care and Sports Medicine [place] [facility]  Adjunct Professor of [place] [employer] [facility] of Medicine

## 2018-12-24 ENCOUNTER — Other Ambulatory Visit: Payer: Self-pay | Admitting: Sports Medicine

## 2018-12-24 DIAGNOSIS — M10071 Idiopathic gout, right ankle and foot: Secondary | ICD-10-CM

## 2018-12-26 ENCOUNTER — Ambulatory Visit: Payer: Medicare Other | Admitting: Sports Medicine

## 2018-12-27 ENCOUNTER — Other Ambulatory Visit: Payer: Self-pay

## 2018-12-27 ENCOUNTER — Encounter: Payer: Self-pay | Admitting: Sports Medicine

## 2018-12-27 ENCOUNTER — Ambulatory Visit (INDEPENDENT_AMBULATORY_CARE_PROVIDER_SITE_OTHER): Payer: Medicare Other | Admitting: Sports Medicine

## 2018-12-27 DIAGNOSIS — M1A0690 Idiopathic chronic gout, unspecified knee, without tophus (tophi): Secondary | ICD-10-CM | POA: Diagnosis not present

## 2018-12-27 DIAGNOSIS — M10071 Idiopathic gout, right ankle and foot: Secondary | ICD-10-CM | POA: Diagnosis not present

## 2018-12-27 DIAGNOSIS — M1A09X0 Idiopathic chronic gout, multiple sites, without tophus (tophi): Secondary | ICD-10-CM | POA: Diagnosis not present

## 2018-12-27 MED ORDER — ALLOPURINOL 300 MG PO TABS: 300.0000 mg | ORAL_TABLET | Freq: Two times a day (BID) | ORAL | 3 refills | Status: DC

## 2018-12-27 MED ORDER — INDOMETHACIN 50 MG PO CAPS: 50.0000 mg | ORAL_CAPSULE | Freq: Two times a day (BID) | ORAL | 0 refills | Status: DC | PRN

## 2018-12-27 NOTE — Assessment & Plan Note (Signed)
Uric acid levels were down to 9 with 300 mg of allopurinol daily, there was a miscommunication with dosing, it seems as though he has been taking his indomethacin twice a day and allopurinol only as needed. We will get this straightened out, he will do allopurinol 300 twice a day with a goal uric acid level of less than 6. Indomethacin as needed for flares, we can check his uric acid levels again in 1 month.

## 2018-12-27 NOTE — Progress Notes (Signed)
Subjective:    CC: Follow-up  HPI: Gout: All symptoms resolved, it seems as though he has been taking his indomethacin twice daily and the allopurinol as needed.  We discussed with him that it should be the other way around.  I reviewed the past medical history, family history, social history, surgical history, and allergies today and no changes were needed.  Please see the problem list section below in epic for further details.  Past Medical History: Past Medical History:  Diagnosis Date  . 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 COPD (chronic obstructive pulmonary disease) (HCC)    PFTS: 7-10: TEV1 ration of 62, FEV1 63% and FVC 80% (mod, class II)  . Emphysema of lung ([place])   . History of shingles   . Hypertension   . Personal history of colonic adenomas 07/05/2012   Past Surgical History: Past Surgical History:  Procedure Laterality Date  . COLONOSCOPY  multiple   Social History: Social History   Socioeconomic History  . Marital status: Married    Spouse name: Not on file  . Number of children: 1  . Years of education: Not on file  . Highest education level: Not on file  Occupational History  . Occupation: disabled    Comment: Designer, jewellery  . Financial resource strain: Not on file  . Food insecurity    Worry: Not on file    Inability: Not on file  . Transportation needs    Medical: Not on file    Non-medical: Not on file  Tobacco Use  . Smoking status: Current Every Day Smoker    Packs/day: 0.50    Years: 42.00    Pack years: 21.00    Types: Cigarettes  . Smokeless tobacco: Never Used  . Tobacco comment: Smokes 3-4 cigarettes a week 12/8  Substance and Sexual Activity  . Alcohol use: Yes    Alcohol/week: 3.0 standard drinks    Types: 3 Standard drinks or equivalent per week  . Drug use: No  . Sexual activity: Not on file  Lifestyle  . Physical activity    Days per week: Not on file    Minutes per session: Not on file  .  Stress: Not on file  Relationships  . Social Herbalist on phone: Not on file    Gets together: Not on file    Attends religious service: Not on file    Active member of club or organization: Not on file    Attends meetings of clubs or organizations: Not on file    Relationship status: Not on file  Other Topics Concern  . Not on file  Social History Narrative   No regular exercise.    Family History: Family History  Problem Relation Age of Onset  . COPD Mother   . Heart attack Father   . Stroke Maternal Grandfather   . Breast cancer Maternal Aunt   . Cancer Maternal Uncle        throat  . Colon cancer Neg Hx   . Esophageal cancer Neg Hx   . Colon polyps Neg Hx   . Rectal cancer Neg Hx   . Stomach cancer Neg Hx    Allergies: Allergies  Allergen Reactions  . Prednisone Other (See Comments)    Nightmares and suicidal thoughts   Medications: See med rec.  Review of Systems: No fevers, chills, night sweats, weight loss, chest pain, or shortness of  breath.   Objective:    General: Well Developed, well nourished, and in no acute distress.  Neuro: Alert and oriented x3, extra-ocular muscles intact, sensation grossly intact.  HEENT: Normocephalic, atraumatic, pupils equal round reactive to light, neck supple, no masses, no lymphadenopathy, thyroid nonpalpable.  Skin: Warm and dry, no rashes. Cardiac: Regular rate and rhythm, no murmurs rubs or gallops, no lower extremity edema.  Respiratory: Clear to auscultation bilaterally. Not using accessory muscles, speaking in full sentences.  Impression and Recommendations:    Gout Uric acid levels were down to 9 with 300 mg of allopurinol daily, there was a miscommunication with dosing, it seems as though he has been taking his indomethacin twice a day and allopurinol only as needed. We will get this straightened out, he will do allopurinol 300 twice a day with a goal uric acid level of less than 6. Indomethacin as needed  for flares, we can check his uric acid levels again in 1 month.   ___________________________________________ Gwen Her. Dianah Field, M.D., ABFM., CAQSM. Primary Care and Sports Medicine [place] [facility]  Adjunct Professor of [place] of [facility] of Medicine

## 2019-01-07 ENCOUNTER — Encounter: Payer: Self-pay | Admitting: Internal Medicine

## 2019-01-29 ENCOUNTER — Encounter: Payer: Self-pay | Admitting: Internal Medicine

## 2019-01-29 ENCOUNTER — Ambulatory Visit (AMBULATORY_SURGERY_CENTER): Payer: Self-pay | Admitting: *Deleted

## 2019-01-29 ENCOUNTER — Other Ambulatory Visit: Payer: Self-pay

## 2019-01-29 VITALS — Temp 96.9°F | Ht 71.0 in | Wt 237.0 lb

## 2019-01-29 DIAGNOSIS — Z86010 Personal history of colonic polyps: Secondary | ICD-10-CM

## 2019-01-29 NOTE — Progress Notes (Signed)
No egg or soy allergy known to patient  No issues with past sedation with any surgeries  or procedures, no intubation problems  No diet pills per patient No home 02 use per patient  No blood thinners per patient  Pt denies issues with constipation  No A fib or A flutter  EMMI video sent to pt's e mail  

## 2019-01-31 DIAGNOSIS — M1A0690 Idiopathic chronic gout, unspecified knee, without tophus (tophi): Secondary | ICD-10-CM | POA: Diagnosis not present

## 2019-01-31 DIAGNOSIS — M10071 Idiopathic gout, right ankle and foot: Secondary | ICD-10-CM | POA: Diagnosis not present

## 2019-01-31 DIAGNOSIS — M1A09X0 Idiopathic chronic gout, multiple sites, without tophus (tophi): Secondary | ICD-10-CM | POA: Diagnosis not present

## 2019-02-01 LAB — URIC ACID: Uric Acid, Serum: 3 mg/dL — ABNORMAL LOW (ref 4.0–8.0)

## 2019-02-11 ENCOUNTER — Ambulatory Visit (INDEPENDENT_AMBULATORY_CARE_PROVIDER_SITE_OTHER): Payer: Medicare Other | Admitting: Sports Medicine

## 2019-02-11 ENCOUNTER — Other Ambulatory Visit: Payer: Self-pay

## 2019-02-11 DIAGNOSIS — M1712 Unilateral primary osteoarthritis, left knee: Secondary | ICD-10-CM | POA: Diagnosis not present

## 2019-02-11 DIAGNOSIS — M1A09X0 Idiopathic chronic gout, multiple sites, without tophus (tophi): Secondary | ICD-10-CM

## 2019-02-11 NOTE — Progress Notes (Addendum)
Subjective:    CC: Left knee swelling  HPI: Edward Warren is a very pleasant 63 year old male, he does have a history of gout, most recent uric acid levels were 3 on allopurinol twice daily.  More recently he is developed increasing pain and swelling in his left knee, localized without radiation, swelling is slightly improving.  I reviewed the past medical history, family history, social history, surgical history, and allergies today and no changes were needed.  Please see the problem list section below in epic for further details.  Past Medical History: Past Medical History:  Diagnosis Date  . Arthritis    left knee   . 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 COPD (chronic obstructive pulmonary disease) (HCC)    PFTS: 7-10: TEV1 ration of 62, FEV1 63% and FVC 80% (mod, class II)  . Emphysema of lung ([place])   . History of shingles   . Hypertension   . Personal history of colonic adenomas 07/05/2012   Past Surgical History: Past Surgical History:  Procedure Laterality Date  . COLONOSCOPY  multiple  . POLYPECTOMY     Social History: Social History   Socioeconomic History  . Marital status: Married    Spouse name: Not on file  . Number of children: 1  . Years of education: Not on file  . Highest education level: Not on file  Occupational History  . Occupation: disabled    Comment: Designer, jewellery  . Financial resource strain: Not on file  . Food insecurity    Worry: Not on file    Inability: Not on file  . Transportation needs    Medical: Not on file    Non-medical: Not on file  Tobacco Use  . Smoking status: Current Every Day Smoker    Packs/day: 0.50    Years: 42.00    Pack years: 21.00    Types: Cigarettes  . Smokeless tobacco: Never Used  . Tobacco comment: Smokes 3-4 cigarettes a week 12/8  Substance and Sexual Activity  . Alcohol use: Yes    Alcohol/week: 3.0 standard drinks    Types: 3 Standard drinks or equivalent per week  . Drug  use: No  . Sexual activity: Not on file  Lifestyle  . Physical activity    Days per week: Not on file    Minutes per session: Not on file  . Stress: Not on file  Relationships  . Social Herbalist on phone: Not on file    Gets together: Not on file    Attends religious service: Not on file    Active member of club or organization: Not on file    Attends meetings of clubs or organizations: Not on file    Relationship status: Not on file  Other Topics Concern  . Not on file  Social History Narrative   No regular exercise.    Family History: Family History  Problem Relation Age of Onset  . COPD Mother   . Heart attack Father   . Stroke Maternal Grandfather   . Breast cancer Maternal Aunt   . Cancer Maternal Uncle        throat  . Esophageal cancer Maternal Uncle   . Colon cancer Neg Hx   . Colon polyps Neg Hx   . Rectal cancer Neg Hx   . Stomach cancer Neg Hx    Allergies: Allergies  Allergen Reactions  . Prednisone Other (See Comments)  Nightmares and suicidal thoughts   Medications: See med rec.  Review of Systems: No fevers, chills, night sweats, weight loss, chest pain, or shortness of breath.   Objective:    General: Well Developed, well nourished, and in no acute distress.  Neuro: Alert and oriented x3, extra-ocular muscles intact, sensation grossly intact.  HEENT: Normocephalic, atraumatic, pupils equal round reactive to light, neck supple, no masses, no lymphadenopathy, thyroid nonpalpable.  Skin: Warm and dry, no rashes. Cardiac: Regular rate and rhythm, no murmurs rubs or gallops, no lower extremity edema.  Respiratory: Clear to auscultation bilaterally. Not using accessory muscles, speaking in full sentences. Left knee: Visibly swollen, palpable fluid wave with an effusion ROM normal in flexion and extension and lower leg rotation. Ligaments with solid consistent endpoints including ACL, PCL, LCL, MCL. Negative Mcmurray's and provocative  meniscal tests. Non painful patellar compression. Patellar and quadriceps tendons unremarkable. Hamstring and quadriceps strength is normal.  Procedure: Real-time Ultrasound Guided  aspiration/injection of left knee Device: GE Logiq E  Verbal informed consent obtained.  Time-out conducted.  Noted no overlying erythema, induration, or other signs of local infection.  Skin prepped in a sterile fashion.  Local anesthesia: Topical Ethyl chloride.  With sterile technique and under real time ultrasound guidance:  Using an 18-gauge needle advanced into the suprapatellar recess, we aspirated 65 cc of cloudy, straw-colored fluid, syringe switched and 1 cc Kenalog 40, 2 cc lidocaine, 2 cc bupivacaine injected easily Completed without difficulty  Pain immediately resolved suggesting accurate placement of the medication.  Advised to call if fevers/chills, erythema, induration, drainage, or persistent bleeding.  Images permanently stored and available for review in the ultrasound unit.  Impression: Technically successful ultrasound guided injection.  Impression and Recommendations:    Primary osteoarthritis of left knee Aspiration and injection, last done in June. Return in a month for this.  Gout and pseudogout Edward Warren has been taking his indomethacin twice daily and his last uric acid levels were 3.0. This is good, no further intervention needed. He can certainly do indomethacin twice a day for flares. His fluid was very cloudy today so we are going to run a crystal analysis. If we do see uric acid or calcium pyrophosphate crystals I will probably start colchicine to be taken daily along with his allopurinol twice daily.  Calcium pyrophosphate crystals seen, this indicates that his diagnosis is both gout and pseudogout.  I am going to add colchicine to be taken daily.  Continue allopurinol.   ___________________________________________ Gwen Her. Dianah Field, M.D., ABFM., CAQSM. Primary Care  and Sports Medicine [place] [facility]  Adjunct Professor of [place] of [facility] of Medicine

## 2019-02-11 NOTE — Assessment & Plan Note (Addendum)
Wille Glaser has been taking his indomethacin twice daily and his last uric acid levels were 3.0. This is good, no further intervention needed. He can certainly do indomethacin twice a day for flares. His fluid was very cloudy today so we are going to run a crystal analysis. If we do see uric acid or calcium pyrophosphate crystals I will probably start colchicine to be taken daily along with his allopurinol twice daily.  Calcium pyrophosphate crystals seen, this indicates that his diagnosis is both gout and pseudogout.  I am going to add colchicine to be taken daily.  Continue allopurinol.

## 2019-02-11 NOTE — Assessment & Plan Note (Signed)
Aspiration and injection, last done in June. Return in a month for this.

## 2019-02-12 ENCOUNTER — Encounter: Payer: Medicare Other | Admitting: Internal Medicine

## 2019-02-12 MED ORDER — COLCHICINE 0.6 MG PO TABS: 0.6000 mg | ORAL_TABLET | Freq: Every day | ORAL | 2 refills | Status: DC

## 2019-02-12 NOTE — Addendum Note (Signed)
Addended by: Silverio Decamp on: 02/12/2019 02:40 PM   Modules accepted: Orders

## 2019-03-11 ENCOUNTER — Ambulatory Visit (INDEPENDENT_AMBULATORY_CARE_PROVIDER_SITE_OTHER): Payer: Medicare Other | Admitting: Sports Medicine

## 2019-03-11 ENCOUNTER — Encounter: Payer: Self-pay | Admitting: Sports Medicine

## 2019-03-11 ENCOUNTER — Other Ambulatory Visit: Payer: Self-pay

## 2019-03-11 DIAGNOSIS — M1A09X0 Idiopathic chronic gout, multiple sites, without tophus (tophi): Secondary | ICD-10-CM

## 2019-03-11 MED ORDER — COLCHICINE 0.6 MG PO TABS: 0.6000 mg | ORAL_TABLET | Freq: Every day | ORAL | 2 refills | Status: DC

## 2019-03-11 NOTE — Progress Notes (Signed)
Subjective:    CC: Follow-up  HPI: Pleasant 63 year old male, he has gout and pseudogout, we aspirated and injected his knee at the last visit and he did well.  I reviewed the past medical history, family history, social history, surgical history, and allergies today and no changes were needed.  Please see the problem list section below in epic for further details.  Past Medical History: Past Medical History:  Diagnosis Date  . Arthritis    left knee   . Asbestosis ([place])    per patient dx by ct scan 2012  . COPD (chronic obstructive pulmonary disease) (HCC)    PFTS: 7-10: TEV1 ration of 62, FEV1 63% and FVC 80% (mod, class II)  . Emphysema of lung ([place])   . History of shingles   . Hypertension   . Personal history of colonic adenomas 07/05/2012   Past Surgical History: Past Surgical History:  Procedure Laterality Date  . COLONOSCOPY  multiple  . POLYPECTOMY     Social History: Social History   Socioeconomic History  . Marital status: Married    Spouse name: Not on file  . Number of children: 1  . Years of education: Not on file  . Highest education level: Not on file  Occupational History  . Occupation: disabled    Comment: Designer, jewellery  . Financial resource strain: Not on file  . Food insecurity    Worry: Not on file    Inability: Not on file  . Transportation needs    Medical: Not on file    Non-medical: Not on file  Tobacco Use  . Smoking status: Current Every Day Smoker    Packs/day: 0.50    Years: 42.00    Pack years: 21.00    Types: Cigarettes  . Smokeless tobacco: Never Used  . Tobacco comment: Smokes 3-4 cigarettes a week 12/8  Substance and Sexual Activity  . Alcohol use: Yes    Alcohol/week: 3.0 standard drinks    Types: 3 Standard drinks or equivalent per week  . Drug use: No  . Sexual activity: Not on file  Lifestyle  . Physical activity    Days per week: Not on file    Minutes per session: Not on file  . Stress: Not on file   Relationships  . Social Herbalist on phone: Not on file    Gets together: Not on file    Attends religious service: Not on file    Active member of club or organization: Not on file    Attends meetings of clubs or organizations: Not on file    Relationship status: Not on file  Other Topics Concern  . Not on file  Social History Narrative   No regular exercise.    Family History: Family History  Problem Relation Age of Onset  . COPD Mother   . Heart attack Father   . Stroke Maternal Grandfather   . Breast cancer Maternal Aunt   . Cancer Maternal Uncle        throat  . Esophageal cancer Maternal Uncle   . Colon cancer Neg Hx   . Colon polyps Neg Hx   . Rectal cancer Neg Hx   . Stomach cancer Neg Hx    Allergies: Allergies  Allergen Reactions  . Prednisone Other (See Comments)    Nightmares and suicidal thoughts   Medications: See med rec.  Review of Systems: No fevers, chills, night sweats, weight loss, chest pain, or  shortness of breath.   Objective:    General: Well Developed, well nourished, and in no acute distress.  Neuro: Alert and oriented x3, extra-ocular muscles intact, sensation grossly intact.  HEENT: Normocephalic, atraumatic, pupils equal round reactive to light, neck supple, no masses, no lymphadenopathy, thyroid nonpalpable.  Skin: Warm and dry, no rashes. Cardiac: Regular rate and rhythm, no murmurs rubs or gallops, no lower extremity edema.  Respiratory: Clear to auscultation bilaterally. Not using accessory muscles, speaking in full sentences. Right knee: Normal to inspection with no erythema or effusion or obvious bony abnormalities. Palpation normal with no warmth or joint line tenderness or patellar tenderness or condyle tenderness. ROM normal in flexion and extension and lower leg rotation. Ligaments with solid consistent endpoints including ACL, PCL, LCL, MCL. Negative Mcmurray's and provocative meniscal tests. Non painful patellar  compression. Patellar and quadriceps tendons unremarkable. Hamstring and quadriceps strength is normal.  Impression and Recommendations:    Gout and pseudogout Arthrocentesis showed calcium pyrophosphate crystals at the last visit, he is doing extremely well today. He is using his allopurinol appropriately now daily, last uric acid levels were 3. He will do indomethacin as needed for flares, he will continue colchicine daily 0.6 mg to prevent his pseudogout flares and go up to twice a day for a week if he gets a flare. This was a little bit difficult for him to understand so I am going to provide a regimen for him to refer to:  Gout and Pseudogout regimen Daily:  Allopurinol 300 mg twice a day Colchicine 0.6 mg daily  During flares: Indomethacin 50 mg twice a day for a week Increase colchicine 0.6 mg to twice a day for a week   ___________________________________________ Gwen Her. Dianah Field, M.D., ABFM., CAQSM. Primary Care and Sports Medicine [facility]  Adjunct Professor of [employer] [facility] [employer]

## 2019-03-11 NOTE — Patient Instructions (Signed)
Gout and Pseudogout regimen Daily:  Allopurinol 300 mg twice a day Colchicine 0.6 mg daily  During flares: Indomethacin 50 mg twice a day for a week Increase colchicine 0.6 mg to twice a day for a week

## 2019-03-11 NOTE — Assessment & Plan Note (Signed)
Arthrocentesis showed calcium pyrophosphate crystals at the last visit, he is doing extremely well today. He is using his allopurinol appropriately now daily, last uric acid levels were 3. He will do indomethacin as needed for flares, he will continue colchicine daily 0.6 mg to prevent his pseudogout flares and go up to twice a day for a week if he gets a flare. This was a little bit difficult for him to understand so I am going to provide a regimen for him to refer to:  Gout and Pseudogout regimen Daily:  Allopurinol 300 mg twice a day Colchicine 0.6 mg daily  During flares: Indomethacin 50 mg twice a day for a week Increase colchicine 0.6 mg to twice a day for a week

## 2019-03-13 LAB — ANAEROBIC AND AEROBIC CULTURE
AER RESULT:: NO GROWTH
MICRO NUMBER:: 910120
MICRO NUMBER:: 910121
SPECIMEN QUALITY:: ADEQUATE
SPECIMEN QUALITY:: ADEQUATE

## 2019-03-13 LAB — SYNOVIAL FLUID, CRYSTAL

## 2019-03-13 LAB — CELL COUNT AND DIFF, FLUID, OTHER
Basophils, %: 0 %
Eosinophils, %: 0 %
Lymphocytes, %: 2 %
Mesothelial, %: 0 %
Monocyte/Macrophage %: 2 %
Neutrophils, %: 96 %
Total Nucleated Cell Ct: 4090 cells/uL

## 2019-03-17 ENCOUNTER — Telehealth: Payer: Self-pay

## 2019-03-17 NOTE — Telephone Encounter (Signed)
Covid-19 screening questions   Do you now or have you had a fever in the last 14 days?  Do you have any respiratory symptoms of shortness of breath or cough now or in the last 14 days?  Do you have any family members or close contacts with diagnosed or suspected Covid-19 in the past 14 days?  Have you been tested for Covid-19 and found to be positive?       

## 2019-03-18 ENCOUNTER — Other Ambulatory Visit: Payer: Self-pay | Admitting: Internal Medicine

## 2019-03-18 ENCOUNTER — Encounter: Payer: Self-pay | Admitting: Internal Medicine

## 2019-03-18 ENCOUNTER — Other Ambulatory Visit: Payer: Self-pay

## 2019-03-18 ENCOUNTER — Ambulatory Visit (AMBULATORY_SURGERY_CENTER): Payer: Medicare Other | Admitting: Internal Medicine

## 2019-03-18 ENCOUNTER — Telehealth: Payer: Self-pay

## 2019-03-18 VITALS — BP 116/71 | HR 70 | Temp 97.8°F | Resp 18 | Ht 71.0 in | Wt 237.0 lb

## 2019-03-18 DIAGNOSIS — D125 Benign neoplasm of sigmoid colon: Secondary | ICD-10-CM

## 2019-03-18 DIAGNOSIS — Z1211 Encounter for screening for malignant neoplasm of colon: Secondary | ICD-10-CM | POA: Diagnosis not present

## 2019-03-18 DIAGNOSIS — D122 Benign neoplasm of ascending colon: Secondary | ICD-10-CM

## 2019-03-18 DIAGNOSIS — D127 Benign neoplasm of rectosigmoid junction: Secondary | ICD-10-CM | POA: Diagnosis not present

## 2019-03-18 DIAGNOSIS — Z86010 Personal history of colonic polyps: Secondary | ICD-10-CM

## 2019-03-18 MED ORDER — SODIUM CHLORIDE 0.9 % IV SOLN: 500.0000 mL | Freq: Once | INTRAVENOUS | Status: DC

## 2019-03-18 NOTE — Op Note (Signed)
[facility] Patient Name: Edward Warren Procedure Date: 03/18/2019 10:17 AM MRN: [id number] Endoscopist: Gatha Mayer , MD Age: 63 Referring MD:  Date of Birth: 1955/07/07 Gender: Male Account #: [id number] Procedure:                Colonoscopy Indications:              Surveillance: Personal history of adenomatous                            polyps on last colonoscopy > 5 years ago Medicines:                Propofol per Anesthesia, Monitored Anesthesia Care Procedure:                Pre-Anesthesia Assessment:                           - Prior to the procedure, a History and Physical                            was performed, and patient medications and                            allergies were reviewed. The patient's tolerance of                            previous anesthesia was also reviewed. The risks                            and benefits of the procedure and the sedation                            options and risks were discussed with the patient.                            All questions were answered, and informed consent                            was obtained. Prior Anticoagulants: The patient has                            taken no previous anticoagulant or antiplatelet                            agents. ASA Grade Assessment: III - A patient with                            severe systemic disease. After reviewing the risks                            and benefits, the patient was deemed in                            satisfactory condition to undergo the procedure.  After obtaining informed consent, the colonoscope                            was passed under direct vision. Throughout the                            procedure, the patient's blood pressure, pulse, and                            oxygen saturations were monitored continuously. The                            Colonoscope was introduced through the anus and        advanced to the the cecum, identified by                            appendiceal orifice and ileocecal valve. The                            colonoscopy was performed without difficulty. The                            patient tolerated the procedure well. The quality                            of the bowel preparation was good. The bowel                            preparation used was Miralax via split dose                            instruction. The ileocecal valve, appendiceal                            orifice, and rectum were photographed. Scope In: 10:38:03 AM Scope Out: 10:51:07 AM Scope Withdrawal Time: 0 hours 11 minutes 32 seconds  Total Procedure Duration: 0 hours 13 minutes 4 seconds  Findings:                 The perianal and digital rectal examinations were                            normal. Pertinent negatives include normal prostate                            (size, shape, and consistency).                           Three sessile polyps were found in the                            recto-sigmoid colon, proximal sigmoid colon and                            ascending colon. The  polyps were diminutive in                            size. These polyps were removed with a cold snare.                            Resection and retrieval were complete. Verification                            of patient identification for the specimen was                            done. Estimated blood loss was minimal.                           Multiple diverticula were found in the sigmoid                            colon.                           The exam was otherwise without abnormality on                            direct and retroflexion views. Complications:            No immediate complications. Estimated Blood Loss:     Estimated blood loss was minimal. Impression:               - Three diminutive polyps at the recto-sigmoid                            colon, in the proximal sigmoid  colon and in the                            ascending colon, removed with a cold snare.                            Resected and retrieved.                           - Diverticulosis in the sigmoid colon.                           - The examination was otherwise normal on direct                            and retroflexion views.                           - Personal history of colonic polyps.                           - 2005 - 5 mm polyp removed but not recovered                           -  05/2009 - 5 polyps max 8 mm , some were adenomas                           - 11/28/2012 2 small polyps adenoma and sessile                            serrated polyp Recommendation:           - Patient has a contact number available for                            emergencies. The signs and symptoms of potential                            delayed complications were discussed with the                            patient. Return to normal activities tomorrow.                            Written discharge instructions were provided to the                            patient.                           - Resume previous diet.                           - Continue present medications.                           - Repeat colonoscopy is recommended for                            surveillance. The colonoscopy date will be                            determined after pathology results from today's                            exam become available for review. Gatha Mayer, MD 03/18/2019 11:00:06 AM This report has been signed electronically.

## 2019-03-18 NOTE — Patient Instructions (Addendum)
I found and removed 3 tiny polyps.  I will let you know pathology results and when to have another routine colonoscopy by mail and/or My Chart.  I appreciate the opportunity to care for you. Gatha Mayer, MD, FACG  YOU HAD AN ENDOSCOPIC PROCEDURE TODAY AT [facility]:   Refer to the procedure report that was given to you for any specific questions about what was found during the examination.  If the procedure report does not answer your questions, please call your gastroenterologist to clarify.  If you requested that your care partner not be given the details of your procedure findings, then the procedure report has been included in a sealed envelope for you to review at your convenience later.  YOU SHOULD EXPECT: Some feelings of bloating in the abdomen. Passage of more gas than usual.  Walking can help get rid of the air that was put into your GI tract during the procedure and reduce the bloating. If you had a lower endoscopy (such as a colonoscopy or flexible sigmoidoscopy) you may notice spotting of blood in your stool or on the toilet paper. If you underwent a bowel prep for your procedure, you may not have a normal bowel movement for a few days.  Please Note:  You might notice some irritation and congestion in your nose or some drainage.  This is from the oxygen used during your procedure.  There is no need for concern and it should clear up in a day or so.  SYMPTOMS TO REPORT IMMEDIATELY:   Following lower endoscopy (colonoscopy or flexible sigmoidoscopy):  Excessive amounts of blood in the stool  Significant tenderness or worsening of abdominal pains  Swelling of the abdomen that is new, acute  Fever of 100F or higher  For urgent or emergent issues, a gastroenterologist can be reached at any hour by calling [phone].   DIET:  We do recommend a small meal at first, but then you may proceed to your regular diet.  Drink plenty of fluids but you should avoid  alcoholic beverages for 24 hours.  ACTIVITY:  You should plan to take it easy for the rest of today and you should NOT DRIVE or use heavy machinery until tomorrow (because of the sedation medicines used during the test).    FOLLOW UP: Our staff will call the number listed on your records 48-72 hours following your procedure to check on you and address any questions or concerns that you may have regarding the information given to you following your procedure. If we do not reach you, we will leave a message.  We will attempt to reach you two times.  During this call, we will ask if you have developed any symptoms of COVID 19. If you develop any symptoms (ie: fever, flu-like symptoms, shortness of breath, cough etc.) before then, please call [phone].  If you test positive for Covid 19 in the 2 weeks post procedure, please call and report this information to [place].    If any biopsies were taken you will be contacted by phone or by letter within the next 1-3 weeks.  Please call us at [phone] if you have not heard about the biopsies in 3 weeks.    SIGNATURES/CONFIDENTIALITY: You and/or your care partner have signed paperwork which will be entered into your electronic medical record.  These signatures attest to the fact that that the information above on your After Visit Summary has been reviewed and is understood.  Full  responsibility of the confidentiality of this discharge information lies with you and/or your care-partner.

## 2019-03-18 NOTE — Progress Notes (Signed)
Report given to PACU, vss 

## 2019-03-18 NOTE — Telephone Encounter (Signed)
Follow up call attempted.  NALM  

## 2019-03-18 NOTE — Progress Notes (Signed)
Called to room to assist during endoscopic procedure.  Patient ID and intended procedure confirmed with present staff. Received instructions for my participation in the procedure from the performing physician.  

## 2019-03-20 ENCOUNTER — Telehealth: Payer: Self-pay | Admitting: *Deleted

## 2019-03-20 NOTE — Telephone Encounter (Signed)
Left message on afternoon f/u call

## 2019-03-20 NOTE — Telephone Encounter (Signed)
No answer for post procedure callback. Left message and will call back later today. 

## 2019-03-27 ENCOUNTER — Encounter: Payer: Self-pay | Admitting: Internal Medicine

## 2019-03-27 NOTE — Progress Notes (Signed)
3 adenomas recall 2025

## 2019-04-09 ENCOUNTER — Ambulatory Visit (INDEPENDENT_AMBULATORY_CARE_PROVIDER_SITE_OTHER): Payer: Medicare Other | Admitting: Family Medicine

## 2019-04-09 ENCOUNTER — Other Ambulatory Visit: Payer: Self-pay

## 2019-04-09 DIAGNOSIS — Z23 Encounter for immunization: Secondary | ICD-10-CM | POA: Diagnosis not present

## 2019-04-15 ENCOUNTER — Ambulatory Visit: Payer: Medicare Other | Admitting: Family Medicine

## 2019-04-30 ENCOUNTER — Ambulatory Visit (INDEPENDENT_AMBULATORY_CARE_PROVIDER_SITE_OTHER): Payer: Medicare Other | Admitting: Family Medicine

## 2019-04-30 ENCOUNTER — Encounter: Payer: Self-pay | Admitting: Family Medicine

## 2019-04-30 ENCOUNTER — Other Ambulatory Visit: Payer: Self-pay

## 2019-04-30 VITALS — BP 118/84 | HR 112 | Ht 71.0 in | Wt 234.0 lb

## 2019-04-30 DIAGNOSIS — J449 Chronic obstructive pulmonary disease, unspecified: Secondary | ICD-10-CM

## 2019-04-30 DIAGNOSIS — Z125 Encounter for screening for malignant neoplasm of prostate: Secondary | ICD-10-CM

## 2019-04-30 DIAGNOSIS — F17200 Nicotine dependence, unspecified, uncomplicated: Secondary | ICD-10-CM | POA: Diagnosis not present

## 2019-04-30 DIAGNOSIS — I10 Essential (primary) hypertension: Secondary | ICD-10-CM

## 2019-04-30 DIAGNOSIS — M1A09X0 Idiopathic chronic gout, multiple sites, without tophus (tophi): Secondary | ICD-10-CM | POA: Diagnosis not present

## 2019-04-30 NOTE — Patient Instructions (Signed)
You are welcome to drop off your formulary book and we can see if there is an alternative to the [facility] that might be less expensive.  Plan to get your pneumonia vaccine done when you come back in 6 months.

## 2019-04-30 NOTE — Progress Notes (Signed)
Established Patient Office Visit  Subjective:  Patient ID: Edward Warren, male    DOB: 15-Oct-1955  Age: 63 y.o. MRN: [id number]  CC:  Chief Complaint  Patient presents with  . Hypertension  . COPD  . Gout    HPI [name] presents for   Hypertension- Pt denies chest pain, SOB, dizziness, or heart palpitations.  Taking meds as directed w/o problems.  Denies medication side effects.    F/U COPD -   F/U Gout -   Past Medical History:  Diagnosis Date  . Arthritis    left knee   . 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 COPD (chronic obstructive pulmonary disease) (HCC)    PFTS: 7-10: TEV1 ration of 62, FEV1 63% and FVC 80% (mod, class II)  . Emphysema of lung ([place])   . History of shingles   . Hypertension   . Personal history of colonic adenomas 07/05/2012    Past Surgical History:  Procedure Laterality Date  . COLONOSCOPY  multiple    Family History  Problem Relation Age of Onset  . COPD Mother   . Heart attack Father   . Stroke Maternal Grandfather   . Breast cancer Maternal Aunt   . Cancer Maternal Uncle        throat  . Esophageal cancer Maternal Uncle   . Colon cancer Neg Hx   . Colon polyps Neg Hx   . Rectal cancer Neg Hx   . Stomach cancer Neg Hx     Social History   Socioeconomic History  . Marital status: Married    Spouse name: Not on file  . Number of children: 1  . Years of education: Not on file  . Highest education level: Not on file  Occupational History  . Occupation: disabled    Comment: Designer, jewellery  . Financial resource strain: Not on file  . Food insecurity    Worry: Not on file    Inability: Not on file  . Transportation needs    Medical: Not on file    Non-medical: Not on file  Tobacco Use  . Smoking status: Current Every Day Smoker    Packs/day: 0.50    Years: 42.00    Pack years: 21.00    Types: Cigarettes  . Smokeless tobacco: Never Used  . Tobacco comment: Smokes 3-4  cigarettes a week 12/8  Substance and Sexual Activity  . Alcohol use: Yes    Alcohol/week: 3.0 standard drinks    Types: 3 Standard drinks or equivalent per week  . Drug use: No  . Sexual activity: Not on file  Lifestyle  . Physical activity    Days per week: Not on file    Minutes per session: Not on file  . Stress: Not on file  Relationships  . Social Herbalist on phone: Not on file    Gets together: Not on file    Attends religious service: Not on file    Active member of club or organization: Not on file    Attends meetings of clubs or organizations: Not on file    Relationship status: Not on file  . Intimate partner violence    Fear of current or ex partner: Not on file    Emotionally abused: Not on file    Physically abused: Not on file    Forced sexual activity: Not on file  Other Topics Concern  .  Not on file  Social History Narrative   No regular exercise.     Outpatient Medications Prior to Visit  Medication Sig Dispense Refill  . albuterol (PROVENTIL) (2.5 MG/3ML) 0.083% nebulizer solution Take 3 mLs (2.5 mg total) by nebulization every 6 (six) hours as needed for wheezing or shortness of breath. 75 mL 12  . allopurinol (ZYLOPRIM) 300 MG tablet Take 1 tablet (300 mg total) by mouth 2 (two) times daily. Twice daily regardless of symptoms 60 tablet 3  . AMBULATORY NON FORMULARY MEDICATION Medication Name: Nebulizer with supplies including tubing etc.  Diagnosis COPD J44.9 1 vial 0  . colchicine 0.6 MG tablet Take 1 tablet (0.6 mg total) by mouth daily. 30 tablet 2  . fluticasone furoate-vilanterol (BREO ELLIPTA) 100-25 MCG/INH AEPB INHALE 1 PUFF BY MOUTH DAILY AS DIRECTED 60 each 11  . indomethacin (INDOCIN) 50 MG capsule Take 1 capsule (50 mg total) by mouth 2 (two) times daily as needed for moderate pain (gout flare). 60 capsule 0  . Olmesartan-amLODIPine-HCTZ 40-10-25 MG TABS Take 1 tablet by mouth daily. 90 tablet 1  . omeprazole (PRILOSEC) 40 MG  capsule TAKE 1 CAPSULE (40 MG TOTAL) BY MOUTH DAILY. TAKE 30 MIN BEFORE FIRST MEAL OF DAY. 90 capsule 3  . PROAIR HFA 108 (90 Base) MCG/ACT inhaler INHALE 2 PUFFS INTO THE LUNGS EVERY 6 (SIX) HOURS AS NEEDED. 18 g 3  . sildenafil (REVATIO) 20 MG tablet Take 2-5 tablets (40-100 mg total) by mouth daily as needed. 60 tablet 2  . Polyethylene Glycol 3350 (MIRALAX PO) Take by mouth. 238 grams for colon prep 9-23 with Dulcolax 5 mg tabs x 4 total     No facility-administered medications prior to visit.     Allergies  Allergen Reactions  . Prednisone Other (See Comments)    Nightmares and suicidal thoughts    ROS Review of Systems    Objective:    Physical Exam  Constitutional: He is oriented to person, place, and time. He appears well-developed and well-nourished.  HENT:  Head: Normocephalic and atraumatic.  Cardiovascular: Normal rate, regular rhythm and normal heart sounds.  Pulmonary/Chest: Effort normal and breath sounds normal.  Neurological: He is alert and oriented to person, place, and time.  Skin: Skin is warm and dry.  Psychiatric: He has a normal mood and affect. His behavior is normal.    BP 118/84   Pulse (!) 112   Ht 5\' 11"  (1.803 m)   Wt 234 lb (106.1 kg)   SpO2 100%   BMI 32.64 kg/m  Wt Readings from Last 3 Encounters:  04/30/19 234 lb (106.1 kg)  03/18/19 237 lb (107.5 kg)  03/11/19 233 lb (105.7 kg)     Health Maintenance Due  Topic Date Due  . HIV Screening  11/30/1968  . PNA vac Low Risk Adult (1 of 2 - PCV13) 12/01/2018    There are no preventive care reminders to display for this patient.  Lab Results  Component Value Date   TSH 1.000 07/01/2010   Lab Results  Component Value Date   WBC 4.1 01/12/2016   HGB 15.3 01/12/2016   HCT 44.9 01/12/2016   MCV 101.4 (H) 01/12/2016   PLT 199 01/12/2016   Lab Results  Component Value Date   NA 138 11/25/2018   K 3.9 11/25/2018   CO2 23 11/25/2018   GLUCOSE 115 (H) 11/25/2018   BUN 16  11/25/2018   CREATININE 0.96 11/25/2018   BILITOT 0.8 11/25/2018   ALKPHOS  62 01/12/2016   AST 38 (H) 11/25/2018   ALT 29 11/25/2018   PROT 6.9 11/25/2018   ALBUMIN 4.1 01/12/2016   CALCIUM 9.7 11/25/2018   Lab Results  Component Value Date   CHOL 167 04/03/2018   Lab Results  Component Value Date   HDL 74 04/03/2018   Lab Results  Component Value Date   LDLCALC 72 04/03/2018   Lab Results  Component Value Date   TRIG 125 04/03/2018   Lab Results  Component Value Date   CHOLHDL 2.3 04/03/2018   Lab Results  Component Value Date   HGBA1C 5.0 02/06/2017      Assessment & Plan:   Problem List Items Addressed This Visit      Cardiovascular and Mediastinum   HYPERTENSION, BENIGN - Primary    Well controlled. Continue current regimen. Follow up in  40mo      Relevant Orders   PSA   Lipid Panel w/reflex Direct LDL   BASIC METABOLIC PANEL WITH GFR     Respiratory   COPD (chronic obstructive pulmonary disease) (HCC)    Stable.  Continue current regimen for now.  He will check with his insurance and see if there is an alternative to the [facility] that might be less expensive.  He says he thinks he may have a formulary book at home and he is more than welcome to drop that off and I will be happy to take a look at it and see if he can come with alternatives.  He may even want to check into pricing with his mail order.      Relevant Orders   PSA   Lipid Panel w/reflex Direct LDL   BASIC METABOLIC PANEL WITH GFR     Other   TOBACCO ABUSE   Gout and pseudogout    No recent flares or exacerbations.  Doing really well on current regimen.  Last uric acid level looks phenomenal.       Other Visit Diagnoses    Screening for prostate cancer       Relevant Orders   PSA      No orders of the defined types were placed in this encounter.   Follow-up: Return in about 6 months (around 10/29/2019).    Beatrice Lecher, MD

## 2019-04-30 NOTE — Assessment & Plan Note (Signed)
Well controlled. Continue current regimen. Follow up in  6 mo  

## 2019-04-30 NOTE — Assessment & Plan Note (Signed)
No recent flares or exacerbations.  Doing really well on current regimen.  Last uric acid level looks phenomenal.

## 2019-04-30 NOTE — Assessment & Plan Note (Signed)
Stable.  Continue current regimen for now.  He will check with his insurance and see if there is an alternative to the [facility] that might be less expensive.  He says he thinks he may have a formulary book at home and he is more than welcome to drop that off and I will be happy to take a look at it and see if he can come with alternatives.  He may even want to check into pricing with his mail order.

## 2019-04-30 NOTE — Progress Notes (Signed)
Pt reports that he is doing well on current regimen.  He is concerned about the cost of his Breo inhaler. He stated that he was able to get this at no cost. He now has to pay $42 for 30 day supply. He wanted to know if there was an alternative that he could use.  I did advise him that it could be due to him being in the doughnut hole or if his insurance has recently changed he may need to meet his deductible before they will cover the entire cost.

## 2019-05-05 ENCOUNTER — Other Ambulatory Visit: Payer: Self-pay

## 2019-05-05 NOTE — Patient Outreach (Signed)
[facility]) Care Management  05/05/2019  Edward Warren 09-13-1955 [id number]   Medication Adherence call to [name] Hippa Identifiers Verify spoke with patient he is past due on Olmesartan/Amlodipine/hctz 40/10/25 mg,patient explain he takes 1 tablet daily,patient explain he has medication for another month and half and then he will order,patient explain he keeps up to date with his medications. Edward Warren is showing past due under [place].  [place] Management Direct Dial [phone]  Fax [phone] [email or web address]

## 2019-05-08 ENCOUNTER — Other Ambulatory Visit: Payer: Self-pay | Admitting: Family Medicine

## 2019-05-08 DIAGNOSIS — I10 Essential (primary) hypertension: Secondary | ICD-10-CM

## 2019-05-13 ENCOUNTER — Other Ambulatory Visit: Payer: Self-pay | Admitting: Sports Medicine

## 2019-05-13 DIAGNOSIS — M1A09X0 Idiopathic chronic gout, multiple sites, without tophus (tophi): Secondary | ICD-10-CM

## 2019-05-25 ENCOUNTER — Other Ambulatory Visit: Payer: Self-pay | Admitting: Sports Medicine

## 2019-05-25 DIAGNOSIS — M1A0690 Idiopathic chronic gout, unspecified knee, without tophus (tophi): Secondary | ICD-10-CM

## 2019-05-25 DIAGNOSIS — M1A09X0 Idiopathic chronic gout, multiple sites, without tophus (tophi): Secondary | ICD-10-CM

## 2019-05-25 DIAGNOSIS — M10071 Idiopathic gout, right ankle and foot: Secondary | ICD-10-CM

## 2019-07-04 ENCOUNTER — Ambulatory Visit (INDEPENDENT_AMBULATORY_CARE_PROVIDER_SITE_OTHER): Payer: Medicare Other | Admitting: Sports Medicine

## 2019-07-04 ENCOUNTER — Encounter: Payer: Self-pay | Admitting: Sports Medicine

## 2019-07-04 DIAGNOSIS — J449 Chronic obstructive pulmonary disease, unspecified: Secondary | ICD-10-CM | POA: Diagnosis not present

## 2019-07-04 DIAGNOSIS — L600 Ingrowing nail: Secondary | ICD-10-CM | POA: Diagnosis not present

## 2019-07-04 DIAGNOSIS — I10 Essential (primary) hypertension: Secondary | ICD-10-CM | POA: Diagnosis not present

## 2019-07-04 MED ORDER — TRIAZOLAM 0.25 MG PO TABS: ORAL_TABLET | ORAL | 0 refills | Status: DC

## 2019-07-04 NOTE — Progress Notes (Signed)
    Procedures performed today:    None.  Independent interpretation of tests performed by another provider:   None.  Impression and Recommendations:    Ingrown toenail of left foot For years Edward Warren has had an ingrown toenail on his left great toe, medial nail fold. It is now starting to get painful. He does have a significant ingrown toenail at the medial nail fold of the great toe, no evidence of paronychia today. He is somewhat apprehensive about the nail plate excision procedure with phenol matricectomy so we will bring him back next week with triazolam for premedication. He understands he needs a driver.     ___________________________________________ Gwen Her. Dianah Field, M.D., ABFM., CAQSM. Primary Care and [place] Instructor of [place] of [facility] of Medicine

## 2019-07-04 NOTE — Assessment & Plan Note (Addendum)
For years Edward Warren has had an ingrown toenail on his left great toe, medial nail fold. It is now starting to get painful. He does have a significant ingrown toenail at the medial nail fold of the great toe, no evidence of paronychia today. He is somewhat apprehensive about the nail plate excision procedure with phenol matricectomy so we will bring him back next week with triazolam for premedication. He understands he needs a driver.

## 2019-07-05 LAB — LIPID PANEL W/REFLEX DIRECT LDL
Cholesterol: 174 mg/dL (ref <200)
HDL: 57 mg/dL (ref >=40)
LDL Cholesterol (Calc): 92 mg/dL (calc)
Non-HDL Cholesterol (Calc): 117 mg/dL (calc) (ref <130)
Total CHOL/HDL Ratio: 3.1 (calc) (ref <5.0)
Triglycerides: 158 mg/dL — ABNORMAL HIGH (ref <150)

## 2019-07-05 LAB — PSA: PSA: 0.7 ng/mL (ref <=4.0)

## 2019-07-05 LAB — BASIC METABOLIC PANEL WITH GFR
BUN: 17 mg/dL (ref 7–25)
CO2: 23 mmol/L (ref 20–32)
Calcium: 10.1 mg/dL (ref 8.6–10.3)
Chloride: 105 mmol/L (ref 98–110)
Creat: 1.1 mg/dL (ref 0.70–1.25)
GFR, Est African American: 81 mL/min/{1.73_m2} (ref >=60)
GFR, Est Non African American: 70 mL/min/{1.73_m2} (ref >=60)
Glucose, Bld: 117 mg/dL — ABNORMAL HIGH (ref 65–99)
Potassium: 4 mmol/L (ref 3.5–5.3)
Sodium: 138 mmol/L (ref 135–146)

## 2019-07-07 NOTE — Progress Notes (Signed)
All labs are normal. 

## 2019-07-08 ENCOUNTER — Ambulatory Visit: Payer: Medicare Other | Admitting: Sports Medicine

## 2019-07-08 ENCOUNTER — Other Ambulatory Visit: Payer: Self-pay

## 2019-07-08 DIAGNOSIS — L600 Ingrowing nail: Secondary | ICD-10-CM

## 2019-07-08 MED ORDER — HYDROCODONE-ACETAMINOPHEN 10-325 MG PO TABS: 1.0000 | ORAL_TABLET | Freq: Three times a day (TID) | ORAL | 0 refills | Status: DC | PRN

## 2019-07-08 NOTE — Assessment & Plan Note (Signed)
As above left great medial nail plate excision with phenol matricectomy of the nailbed and nail matrix. High-dose hydrocodone for postoperative pain. Return to see me in 2 weeks.

## 2019-07-08 NOTE — Progress Notes (Signed)
   Procedure:  Removal of left great medial nail plate with phenol matricectomy Risks, benefits, alternatives explained to patient. Consent obtained. Time out conducted. Noted no overlying induration or erythema at site of injection. Toe cleaned with alcohol, then a total of 10cc lidocaine 1% infiltrated at adjacent webspaces at the location of the bifurcation of the common digital nerve to proper digital nerves.  Some lidocaine also infiltrated at hyponychium and under nail bed.  Adequate anesthesia ensured. Toe prepped and draped in a sterile fashion. Nail elevator used to separate nail plate from nail bed. Clippers used to cut toenail in a longitudinal fashion to proximal nail fold and matrix. Hemostat then used to separate nail fragment from surrounding structures. Nail bed and matrix treated. Minor bleeding controlled with pressure and phenol. Antibiotic ointment applied. Toe dressed. Advised to return if increased redness, swelling, drainage, fevers, or chills.

## 2019-07-08 NOTE — Patient Instructions (Signed)
Fingernail or Toenail Removal, Adult, Care After °This sheet gives you information about how to care for yourself after your procedure. Your health care provider may also give you more specific instructions. If you have problems or questions, contact your health care provider. °What can I expect after the procedure? °After the procedure, it is common to have: °· Pain. °· Redness. °· Swelling. °· Soreness. °Follow these instructions at home: °Medicines °· Take over-the-counter and prescription medicines only as told by your health care provider. °· If you were prescribed an antibiotic medicine, take or apply it as told by your health care provider. Do not stop using the antibiotic even if you start to feel better. °Wound care °· Follow instructions from your health care provider about how to take care of your wound. Make sure you: °? Wash your hands with soap and water for at least 20 seconds before and after you change your bandage (dressing). If soap and water are not available, use hand sanitizer. °? Change your dressing as told by your health care provider. °? Keep your dressing dry until your health care provider says it can be removed. °· Check your wound every day for signs of infection. Check for: °? More redness, swelling, or pain. °? More fluid or blood. °? Warmth. °? Pus or a bad smell. °If you have a splint: ° °· Wear the splint as told by your health care provider. Remove it only as told by your health care provider. °· Loosen the splint if your fingers tingle, become numb, or turn cold and blue. °· Keep the splint clean. °· If the splint is not waterproof: °? Do not let it get wet. °? Cover it with a watertight covering when you take a bath or a shower. °Managing pain, stiffness, and swelling °· Move your fingers or toes often to reduce stiffness and swelling. °· Raise (elevate) the injured area above the level of your heart while you are sitting or lying down. You may need to keep your hand or foot  raised or supported on a pillow for 24 hours or as told by your health care provider. °General instructions °· If you were given a shoe to wear, wear it as told by your health care provider. °· Keep all follow-up visits as told by your health care provider. This is important. °Contact a health care provider if: °· You have more redness, swelling, or pain around your wound. °· You have more fluid or blood coming from your wound. °· You have pus or a bad smell coming from your wound. °· Your wound feels warm to the touch. °· You have a fever. °Get help right away if: °· Your finger or toe looks pale, blue, or black. °· You are not able to move your finger or toe. °Summary °· After the procedure, it is common to have pain and swelling. °· Keep the hand or foot raised (elevated) or supported on a pillow as told by your health care provider. °· Take over-the-counter and prescription medicines only as told by your health care provider. °· Check your wound every day for signs of infection. °This information is not intended to replace advice given to you by your health care provider. Make sure you discuss any questions you have with your health care provider. °Document Revised: 12/30/2018 Document Reviewed: 12/30/2018 °Elsevier Patient Education © 2020 Elsevier Inc. ° °

## 2019-07-22 ENCOUNTER — Ambulatory Visit: Payer: Medicare Other | Admitting: Sports Medicine

## 2019-07-22 ENCOUNTER — Ambulatory Visit (INDEPENDENT_AMBULATORY_CARE_PROVIDER_SITE_OTHER): Payer: Medicare Other | Admitting: Sports Medicine

## 2019-07-22 DIAGNOSIS — L600 Ingrowing nail: Secondary | ICD-10-CM

## 2019-07-22 NOTE — Progress Notes (Signed)
    Procedures performed today:    None.  Independent interpretation of notes and tests performed by another provider:   None.  Impression and Recommendations:    Ingrown toenail of left foot Denali returns, 2 weeks ago I did a left great medial nail plate excision with phenol matricectomy of the nailbed and the nail matrix. He returns today doing extremely well, no pain, happy with how things are going, return as needed.    ___________________________________________ Gwen Her. Dianah Field, M.D., ABFM., CAQSM. Primary Care and [place] Instructor of [place] of [facility] of Medicine

## 2019-07-22 NOTE — Assessment & Plan Note (Signed)
Edward Warren returns, 2 weeks ago I did a left great medial nail plate excision with phenol matricectomy of the nailbed and the nail matrix. He returns today doing extremely well, no pain, happy with how things are going, return as needed.

## 2019-09-09 ENCOUNTER — Other Ambulatory Visit: Payer: Self-pay | Admitting: Sports Medicine

## 2019-09-09 DIAGNOSIS — M1A09X0 Idiopathic chronic gout, multiple sites, without tophus (tophi): Secondary | ICD-10-CM

## 2019-10-06 ENCOUNTER — Other Ambulatory Visit: Payer: Self-pay | Admitting: Family Medicine

## 2019-10-06 DIAGNOSIS — R1013 Epigastric pain: Secondary | ICD-10-CM

## 2019-10-29 ENCOUNTER — Ambulatory Visit: Payer: Medicare Other | Admitting: Family Medicine

## 2019-10-29 NOTE — Progress Notes (Deleted)
Established Patient Office Visit  Subjective:  Patient ID: Edward Warren, male    DOB: 08-25-1955  Age: 64 y.o. MRN: [id number]  CC: No chief complaint on file.   HPI Edward Warren presents for   Hypertension- Pt denies chest pain, SOB, dizziness, or heart palpitations.  Taking meds as directed w/o problems.  Denies medication side effects.    F/u COPD -   F/U GERD.   Past Medical History:  Diagnosis Date  . Arthritis    left knee   . 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 COPD (chronic obstructive pulmonary disease) (HCC)    PFTS: 7-10: TEV1 ration of 62, FEV1 63% and FVC 80% (mod, class II)  . Emphysema of lung ([place])   . History of shingles   . Hypertension   . Personal history of colonic adenomas 07/05/2012    Past Surgical History:  Procedure Laterality Date  . COLONOSCOPY  multiple    Family History  Problem Relation Age of Onset  . COPD Mother   . Heart attack Father   . Stroke Maternal Grandfather   . Breast cancer Maternal Aunt   . Cancer Maternal Uncle        throat  . Esophageal cancer Maternal Uncle   . Colon cancer Neg Hx   . Colon polyps Neg Hx   . Rectal cancer Neg Hx   . Stomach cancer Neg Hx     Social History   Socioeconomic History  . Marital status: Married    Spouse name: Not on file  . Number of children: 1  . Years of education: Not on file  . Highest education level: Not on file  Occupational History  . Occupation: disabled    Comment: [employer]  Tobacco Use  . Smoking status: Current Every Day Smoker    Packs/day: 0.50    Years: 42.00    Pack years: 21.00    Types: Cigarettes  . Smokeless tobacco: Never Used  . Tobacco comment: Smokes 3-4 cigarettes a week 12/8  Substance and Sexual Activity  . Alcohol use: Yes    Alcohol/week: 3.0 standard drinks    Types: 3 Standard drinks or equivalent per week  . Drug use: No  . Sexual activity: Not on file  Other Topics Concern  . Not on file  Social  History Narrative   No regular exercise.    Social Determinants of Health   Financial Resource Strain:   . Difficulty of Paying Living Expenses:   Food Insecurity:   . Worried About Charity fundraiser in the Last Year:   . Arboriculturist in the Last Year:   Transportation Needs:   . Film/video editor (Medical):   [name] Kitchen Lack of Transportation (Non-Medical):   Physical Activity:   . Days of Exercise per Week:   . Minutes of Exercise per Session:   Stress:   . Feeling of Stress :   Social Connections:   . Frequency of Communication with Friends and Family:   . Frequency of Social Gatherings with Friends and Family:   . Attends Religious Services:   . Active Member of Clubs or Organizations:   . Attends Archivist Meetings:   [name] Kitchen Marital Status:   Intimate Partner Violence:   . Fear of Current or Ex-Partner:   . Emotionally Abused:   [name] Kitchen Physically Abused:   . Sexually Abused:     Outpatient Medications  Prior to Visit  Medication Sig Dispense Refill  . albuterol (PROVENTIL) (2.5 MG/3ML) 0.083% nebulizer solution Take 3 mLs (2.5 mg total) by nebulization every 6 (six) hours as needed for wheezing or shortness of breath. 75 mL 12  . allopurinol (ZYLOPRIM) 300 MG tablet TAKE 1 TABLET (300 MG TOTAL) BY MOUTH 2 (TWO) TIMES DAILY. TWICE DAILY REGARDLESS OF SYMPTOMS 180 tablet 1  . AMBULATORY NON FORMULARY MEDICATION Medication Name: Nebulizer with supplies including tubing etc.  Diagnosis COPD J44.9 1 vial 0  . colchicine 0.6 MG tablet TAKE 1 TABLET BY MOUTH EVERY DAY 90 tablet 0  . fluticasone furoate-vilanterol (BREO ELLIPTA) 100-25 MCG/INH AEPB INHALE 1 PUFF BY MOUTH DAILY AS DIRECTED 60 each 11  . indomethacin (INDOCIN) 50 MG capsule Take 1 capsule (50 mg total) by mouth 2 (two) times daily as needed for moderate pain (gout flare). 60 capsule 0  . Olmesartan-amLODIPine-HCTZ 40-10-25 MG TABS TAKE 1 TABLET BY MOUTH EVERY DAY 90 tablet 1  . omeprazole (PRILOSEC) 40 MG capsule  TAKE 1 CAPSULE (40 MG TOTAL) BY MOUTH DAILY. TAKE 30 MIN BEFORE FIRST MEAL OF DAY. 90 capsule 3  . PROAIR HFA 108 (90 Base) MCG/ACT inhaler INHALE 2 PUFFS INTO THE LUNGS EVERY 6 (SIX) HOURS AS NEEDED. 18 g 3  . sildenafil (REVATIO) 20 MG tablet Take 2-5 tablets (40-100 mg total) by mouth daily as needed. 60 tablet 2   No facility-administered medications prior to visit.    Allergies  Allergen Reactions  . Prednisone Other (See Comments)    Nightmares and suicidal thoughts    ROS Review of Systems    Objective:    Physical Exam  There were no vitals taken for this visit. Wt Readings from Last 3 Encounters:  04/30/19 234 lb (106.1 kg)  03/18/19 237 lb (107.5 kg)  03/11/19 233 lb (105.7 kg)     Health Maintenance Due  Topic Date Due  . HIV Screening  Never done  . PNA vac Low Risk Adult (1 of 2 - PCV13) 12/01/2018    There are no preventive care reminders to display for this patient.  Lab Results  Component Value Date   TSH 1.000 07/01/2010   Lab Results  Component Value Date   WBC 4.1 01/12/2016   HGB 15.3 01/12/2016   HCT 44.9 01/12/2016   MCV 101.4 (H) 01/12/2016   PLT 199 01/12/2016   Lab Results  Component Value Date   NA 138 07/04/2019   K 4.0 07/04/2019   CO2 23 07/04/2019   GLUCOSE 117 (H) 07/04/2019   BUN 17 07/04/2019   CREATININE 1.10 07/04/2019   BILITOT 0.8 11/25/2018   ALKPHOS 62 01/12/2016   AST 38 (H) 11/25/2018   ALT 29 11/25/2018   PROT 6.9 11/25/2018   ALBUMIN 4.1 01/12/2016   CALCIUM 10.1 07/04/2019   Lab Results  Component Value Date   CHOL 174 07/04/2019   Lab Results  Component Value Date   HDL 57 07/04/2019   Lab Results  Component Value Date   LDLCALC 92 07/04/2019   Lab Results  Component Value Date   TRIG 158 (H) 07/04/2019   Lab Results  Component Value Date   CHOLHDL 3.1 07/04/2019   Lab Results  Component Value Date   HGBA1C 5.0 02/06/2017      Assessment & Plan:   Problem List Items Addressed This  Visit      Cardiovascular and Mediastinum   HYPERTENSION, BENIGN - Primary     Respiratory  COPD (chronic obstructive pulmonary disease) (HCC)     Digestive   GERD      No orders of the defined types were placed in this encounter.   Follow-up: No follow-ups on file.    Beatrice Lecher, MD

## 2019-11-01 ENCOUNTER — Other Ambulatory Visit: Payer: Self-pay | Admitting: Family Medicine

## 2019-11-01 DIAGNOSIS — J449 Chronic obstructive pulmonary disease, unspecified: Secondary | ICD-10-CM

## 2019-11-05 ENCOUNTER — Other Ambulatory Visit: Payer: Self-pay | Admitting: Sports Medicine

## 2019-11-05 DIAGNOSIS — M1A09X0 Idiopathic chronic gout, multiple sites, without tophus (tophi): Secondary | ICD-10-CM

## 2019-11-20 ENCOUNTER — Other Ambulatory Visit: Payer: Self-pay | Admitting: Family Medicine

## 2019-11-20 DIAGNOSIS — I10 Essential (primary) hypertension: Secondary | ICD-10-CM

## 2019-11-25 ENCOUNTER — Other Ambulatory Visit: Payer: Self-pay | Admitting: Family Medicine

## 2019-11-25 DIAGNOSIS — I10 Essential (primary) hypertension: Secondary | ICD-10-CM

## 2020-01-20 ENCOUNTER — Other Ambulatory Visit: Payer: Self-pay | Admitting: Sports Medicine

## 2020-01-20 DIAGNOSIS — M1A09X0 Idiopathic chronic gout, multiple sites, without tophus (tophi): Secondary | ICD-10-CM

## 2020-02-25 ENCOUNTER — Encounter: Payer: Self-pay | Admitting: Family Medicine

## 2020-02-25 ENCOUNTER — Ambulatory Visit (INDEPENDENT_AMBULATORY_CARE_PROVIDER_SITE_OTHER): Payer: Medicare Other | Admitting: Family Medicine

## 2020-02-25 VITALS — BP 132/86 | HR 90 | Temp 98.2°F | Wt 224.7 lb

## 2020-02-25 DIAGNOSIS — M109 Gout, unspecified: Secondary | ICD-10-CM | POA: Diagnosis not present

## 2020-02-25 DIAGNOSIS — K439 Ventral hernia without obstruction or gangrene: Secondary | ICD-10-CM | POA: Diagnosis not present

## 2020-02-25 DIAGNOSIS — Z0000 Encounter for general adult medical examination without abnormal findings: Secondary | ICD-10-CM

## 2020-02-25 DIAGNOSIS — M1A09X0 Idiopathic chronic gout, multiple sites, without tophus (tophi): Secondary | ICD-10-CM

## 2020-02-25 DIAGNOSIS — I10 Essential (primary) hypertension: Secondary | ICD-10-CM | POA: Diagnosis not present

## 2020-02-25 DIAGNOSIS — Z23 Encounter for immunization: Secondary | ICD-10-CM | POA: Diagnosis not present

## 2020-02-25 NOTE — Progress Notes (Signed)
Established Patient Office Visit  Subjective:  Patient ID: Edward Warren, male    DOB: 1956-04-19  Age: 64 y.o. MRN: [id number]  CC:  Chief Complaint  Patient presents with  . Follow-up    HPI DILLINGER ASTON presents for CPE. He is doing well. Has been going to the gym at the [facility] about once a week doing some cardio and weights.  He feels like he is doing pretty good with his dietary choices he denies any new changes or updates no change to family history.  He did want to discuss his abdominal wall hernia.  He is interested in getting it corrected.  He would like to get a flu vaccine.  Past Medical History:  Diagnosis Date  . Arthritis    left knee   . 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 COPD (chronic obstructive pulmonary disease) (HCC)    PFTS: 7-10: TEV1 ration of 62, FEV1 63% and FVC 80% (mod, class II)  . Emphysema of lung ([place])   . History of shingles   . Hypertension   . Personal history of colonic adenomas 07/05/2012    Past Surgical History:  Procedure Laterality Date  . COLONOSCOPY  multiple    Family History  Problem Relation Age of Onset  . COPD Mother   . Heart attack Father   . Stroke Maternal Grandfather   . Breast cancer Maternal Aunt   . Cancer Maternal Uncle        throat  . Esophageal cancer Maternal Uncle   . Colon cancer Neg Hx   . Colon polyps Neg Hx   . Rectal cancer Neg Hx   . Stomach cancer Neg Hx     Social History   Socioeconomic History  . Marital status: Married    Spouse name: Not on file  . Number of children: 1  . Years of education: Not on file  . Highest education level: Not on file  Occupational History  . Occupation: disabled    Comment: [employer]  Tobacco Use  . Smoking status: Current Every Day Smoker    Packs/day: 0.50    Years: 42.00    Pack years: 21.00    Types: Cigarettes  . Smokeless tobacco: Never Used  . Tobacco comment: Smokes 3-4 cigarettes a week 12/8  Vaping Use  .  Vaping Use: Never used  Substance and Sexual Activity  . Alcohol use: Yes    Alcohol/week: 3.0 standard drinks    Types: 3 Standard drinks or equivalent per week  . Drug use: No  . Sexual activity: Not on file  Other Topics Concern  . Not on file  Social History Narrative   No regular exercise.    Social Determinants of Health   Financial Resource Strain:   . Difficulty of Paying Living Expenses: Not on file  Food Insecurity:   . Worried About Charity fundraiser in the Last Year: Not on file  . Ran Out of Food in the Last Year: Not on file  Transportation Needs:   . Lack of Transportation (Medical): Not on file  . Lack of Transportation (Non-Medical): Not on file  Physical Activity:   . Days of Exercise per Week: Not on file  . Minutes of Exercise per Session: Not on file  Stress:   . Feeling of Stress : Not on file  Social Connections:   . Frequency of Communication with Friends and Family: Not on  file  . Frequency of Social Gatherings with Friends and Family: Not on file  . Attends Religious Services: Not on file  . Active Member of Clubs or Organizations: Not on file  . Attends Archivist Meetings: Not on file  . Marital Status: Not on file  Intimate Partner Violence:   . Fear of Current or Ex-Partner: Not on file  . Emotionally Abused: Not on file  . Physically Abused: Not on file  . Sexually Abused: Not on file    Outpatient Medications Prior to Visit  Medication Sig Dispense Refill  . albuterol (PROVENTIL) (2.5 MG/3ML) 0.083% nebulizer solution Take 3 mLs (2.5 mg total) by nebulization every 6 (six) hours as needed for wheezing or shortness of breath. 75 mL 12  . allopurinol (ZYLOPRIM) 300 MG tablet TAKE 1 TABLET (300 MG TOTAL) BY MOUTH 2 (TWO) TIMES DAILY. TWICE DAILY REGARDLESS OF SYMPTOMS 180 tablet 1  . AMBULATORY NON FORMULARY MEDICATION Medication Name: Nebulizer with supplies including tubing etc.  Diagnosis COPD J44.9 1 vial 0  . BREO ELLIPTA  100-25 MCG/INH AEPB INHALE 1 PUFF BY MOUTH DAILY AS DIRECTED 60 each 11  . colchicine 0.6 MG tablet TAKE 1 TABLET BY MOUTH EVERY DAY 30 tablet 2  . indomethacin (INDOCIN) 50 MG capsule Take 1 capsule (50 mg total) by mouth 2 (two) times daily as needed for moderate pain (gout flare). 60 capsule 0  . Olmesartan-amLODIPine-HCTZ 40-10-25 MG TABS Take 1 tablet by mouth daily. 90 tablet 1  . omeprazole (PRILOSEC) 40 MG capsule TAKE 1 CAPSULE (40 MG TOTAL) BY MOUTH DAILY. TAKE 30 MIN BEFORE FIRST MEAL OF DAY. 90 capsule 3  . PROAIR HFA 108 (90 Base) MCG/ACT inhaler INHALE 2 PUFFS INTO THE LUNGS EVERY 6 (SIX) HOURS AS NEEDED. 18 g 3  . sildenafil (REVATIO) 20 MG tablet Take 2-5 tablets (40-100 mg total) by mouth daily as needed. 60 tablet 2   No facility-administered medications prior to visit.    Allergies  Allergen Reactions  . Prednisone Other (See Comments)    Nightmares and suicidal thoughts    ROS Review of Systems    Objective:    Physical Exam Constitutional:      Appearance: He is well-developed.  HENT:     Head: Normocephalic and atraumatic.     Right Ear: External ear normal.     Left Ear: External ear normal.     Nose: Nose normal.  Eyes:     Conjunctiva/sclera: Conjunctivae normal.     Pupils: Pupils are equal, round, and reactive to light.  Neck:     Thyroid: No thyromegaly.  Cardiovascular:     Rate and Rhythm: Normal rate and regular rhythm.     Heart sounds: Normal heart sounds.  Pulmonary:     Effort: Pulmonary effort is normal.     Breath sounds: Normal breath sounds.  Abdominal:     General: Bowel sounds are normal. There is no distension.     Palpations: Abdomen is soft. There is no mass.     Tenderness: There is no abdominal tenderness. There is no guarding or rebound.  Musculoskeletal:        General: Normal range of motion.     Cervical back: Normal range of motion and neck supple.  Lymphadenopathy:     Cervical: No cervical adenopathy.  Skin:     General: Skin is warm and dry.  Neurological:     Mental Status: He is alert and oriented to person,  place, and time.     Deep Tendon Reflexes: Reflexes are normal and symmetric.  Psychiatric:        Behavior: Behavior normal.        Thought Content: Thought content normal.        Judgment: Judgment normal.     BP 132/86 (BP Location: Left Arm, Patient Position: Sitting, Cuff Size: Normal)   Pulse 90   Temp 98.2 F (36.8 C) (Oral)   Wt 224 lb 11.2 oz (101.9 kg)   BMI 31.34 kg/m  Wt Readings from Last 3 Encounters:  02/25/20 224 lb 11.2 oz (101.9 kg)  04/30/19 234 lb (106.1 kg)  03/18/19 237 lb (107.5 kg)     There are no preventive care reminders to display for this patient.  There are no preventive care reminders to display for this patient.  Lab Results  Component Value Date   TSH 1.000 07/01/2010   Lab Results  Component Value Date   WBC 4.1 01/12/2016   HGB 15.3 01/12/2016   HCT 44.9 01/12/2016   MCV 101.4 (H) 01/12/2016   PLT 199 01/12/2016   Lab Results  Component Value Date   NA 138 07/04/2019   K 4.0 07/04/2019   CO2 23 07/04/2019   GLUCOSE 117 (H) 07/04/2019   BUN 17 07/04/2019   CREATININE 1.10 07/04/2019   BILITOT 0.8 11/25/2018   ALKPHOS 62 01/12/2016   AST 38 (H) 11/25/2018   ALT 29 11/25/2018   PROT 6.9 11/25/2018   ALBUMIN 4.1 01/12/2016   CALCIUM 10.1 07/04/2019   Lab Results  Component Value Date   CHOL 174 07/04/2019   Lab Results  Component Value Date   HDL 57 07/04/2019   Lab Results  Component Value Date   LDLCALC 92 07/04/2019   Lab Results  Component Value Date   TRIG 158 (H) 07/04/2019   Lab Results  Component Value Date   CHOLHDL 3.1 07/04/2019   Lab Results  Component Value Date   HGBA1C 5.0 02/06/2017      Assessment & Plan:   Problem List Items Addressed This Visit      Cardiovascular and Mediastinum   HYPERTENSION, BENIGN   Relevant Orders   COMPLETE METABOLIC PANEL WITH GFR     Other   Gout and  pseudogout   Relevant Orders   Uric acid    Other Visit Diagnoses    Wellness examination    -  Primary   Relevant Orders   COMPLETE METABOLIC PANEL WITH GFR   Uric acid   Need for influenza vaccination       Relevant Orders   Flu Vaccine QUAD High Dose(Fluad) (Completed)   Abdominal wall hernia       Relevant Orders   Ambulatory referral to General Surgery     Keep up a regular exercise program and make sure you are eating a healthy diet Try to eat 4 servings of dairy a day, or if you are lactose intolerant take a calcium with vitamin D daily.  Your vaccines are up to date. Flu vaccine given today.   Will get updated labs.    No orders of the defined types were placed in this encounter.   Follow-up: No follow-ups on file.    Beatrice Lecher, MD

## 2020-02-26 LAB — COMPLETE METABOLIC PANEL WITH GFR
AG Ratio: 1.8 (calc) (ref 1.0–2.5)
ALT: 21 U/L (ref 9–46)
AST: 20 U/L (ref 10–35)
Albumin: 4.6 g/dL (ref 3.6–5.1)
Alkaline phosphatase (APISO): 98 U/L (ref 35–144)
BUN: 16 mg/dL (ref 7–25)
CO2: 24 mmol/L (ref 20–32)
Calcium: 10 mg/dL (ref 8.6–10.3)
Chloride: 104 mmol/L (ref 98–110)
Creat: 1.22 mg/dL (ref 0.70–1.25)
GFR, Est African American: 71 mL/min/{1.73_m2} (ref >=60)
GFR, Est Non African American: 61 mL/min/{1.73_m2} (ref >=60)
Globulin: 2.6 g/dL (calc) (ref 1.9–3.7)
Glucose, Bld: 110 mg/dL — ABNORMAL HIGH (ref 65–99)
Potassium: 3.8 mmol/L (ref 3.5–5.3)
Sodium: 137 mmol/L (ref 135–146)
Total Bilirubin: 0.6 mg/dL (ref 0.2–1.2)
Total Protein: 7.2 g/dL (ref 6.1–8.1)

## 2020-02-26 LAB — URIC ACID: Uric Acid, Serum: 4.5 mg/dL (ref 4.0–8.0)

## 2020-02-26 NOTE — Progress Notes (Signed)
All labs are normal. 

## 2020-03-25 DIAGNOSIS — M6208 Separation of muscle (nontraumatic), other site: Secondary | ICD-10-CM | POA: Diagnosis not present

## 2020-04-01 ENCOUNTER — Other Ambulatory Visit: Payer: Self-pay | Admitting: Sports Medicine

## 2020-04-01 DIAGNOSIS — M1A09X0 Idiopathic chronic gout, multiple sites, without tophus (tophi): Secondary | ICD-10-CM

## 2020-05-21 ENCOUNTER — Other Ambulatory Visit: Payer: Self-pay | Admitting: Family Medicine

## 2020-05-21 DIAGNOSIS — I10 Essential (primary) hypertension: Secondary | ICD-10-CM

## 2020-08-13 ENCOUNTER — Other Ambulatory Visit: Payer: Self-pay | Admitting: Family Medicine

## 2020-08-13 ENCOUNTER — Other Ambulatory Visit: Payer: Self-pay | Admitting: Sports Medicine

## 2020-08-13 DIAGNOSIS — M1A09X0 Idiopathic chronic gout, multiple sites, without tophus (tophi): Secondary | ICD-10-CM

## 2020-08-13 DIAGNOSIS — M1A0690 Idiopathic chronic gout, unspecified knee, without tophus (tophi): Secondary | ICD-10-CM

## 2020-08-13 DIAGNOSIS — M10071 Idiopathic gout, right ankle and foot: Secondary | ICD-10-CM

## 2020-08-25 ENCOUNTER — Encounter: Payer: Self-pay | Admitting: Family Medicine

## 2020-08-25 ENCOUNTER — Ambulatory Visit (INDEPENDENT_AMBULATORY_CARE_PROVIDER_SITE_OTHER): Payer: Medicare Other | Admitting: Family Medicine

## 2020-08-25 ENCOUNTER — Other Ambulatory Visit: Payer: Self-pay

## 2020-08-25 VITALS — BP 115/64 | HR 93 | Ht 71.0 in | Wt 236.0 lb

## 2020-08-25 DIAGNOSIS — Z13220 Encounter for screening for lipoid disorders: Secondary | ICD-10-CM

## 2020-08-25 DIAGNOSIS — I10 Essential (primary) hypertension: Secondary | ICD-10-CM | POA: Diagnosis not present

## 2020-08-25 DIAGNOSIS — J449 Chronic obstructive pulmonary disease, unspecified: Secondary | ICD-10-CM

## 2020-08-25 DIAGNOSIS — Z125 Encounter for screening for malignant neoplasm of prostate: Secondary | ICD-10-CM

## 2020-08-25 NOTE — Assessment & Plan Note (Signed)
Stable.  He does have prolonged expiration on exam.  We will see if we can find something more affordable than the [facility] though he has done well on it.  He will try to get me a copy of his formulary medications for inhalers

## 2020-08-25 NOTE — Assessment & Plan Note (Signed)
Well controlled. Continue current regimen. Follow up in  6 mo  

## 2020-08-25 NOTE — Progress Notes (Signed)
Established Patient Office Visit  Subjective:  Patient ID: Edward Warren, male    DOB: 12/12/1955  Age: 65 y.o. MRN: [id number]  CC:  Chief Complaint  Patient presents with  . Hypertension    HPI Edward Warren presents for 6 mo f/u  Hypertension- Pt denies chest pain, SOB, dizziness, or heart palpitations.  Taking meds as directed w/o problems.  Denies medication side effects.    F/U COPD -no recent changes or shortness of breath.  He continues to smoke.  He reports that he is using his Breo daily.  It cost about $40 a month it ended up putting him into his donut hole in November last year he wonders if there is something that might be cheaper.  Past Medical History:  Diagnosis Date  . Arthritis    left knee   . 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 COPD (chronic obstructive pulmonary disease) (HCC)    PFTS: 7-10: TEV1 ration of 62, FEV1 63% and FVC 80% (mod, class II)  . Emphysema of lung ([place])   . History of shingles   . Hypertension   . Personal history of colonic adenomas 07/05/2012    Past Surgical History:  Procedure Laterality Date  . COLONOSCOPY  multiple    Family History  Problem Relation Age of Onset  . COPD Mother   . Heart attack Father   . Stroke Maternal Grandfather   . Breast cancer Maternal Aunt   . Cancer Maternal Uncle        throat  . Esophageal cancer Maternal Uncle   . Colon cancer Neg Hx   . Colon polyps Neg Hx   . Rectal cancer Neg Hx   . Stomach cancer Neg Hx     Social History   Socioeconomic History  . Marital status: Married    Spouse name: Not on file  . Number of children: 1  . Years of education: Not on file  . Highest education level: Not on file  Occupational History  . Occupation: disabled    Comment: [employer]  Tobacco Use  . Smoking status: Current Every Day Smoker    Packs/day: 0.50    Years: 42.00    Pack years: 21.00    Types: Cigarettes  . Smokeless tobacco: Never Used  . Tobacco  comment: Smokes 3-4 cigarettes a week 12/8  Vaping Use  . Vaping Use: Never used  Substance and Sexual Activity  . Alcohol use: Yes    Alcohol/week: 3.0 standard drinks    Types: 3 Standard drinks or equivalent per week  . Drug use: No  . Sexual activity: Not on file  Other Topics Concern  . Not on file  Social History Narrative   No regular exercise.    Social Determinants of Health   Financial Resource Strain: Not on file  Food Insecurity: Not on file  Transportation Needs: Not on file  Physical Activity: Not on file  Stress: Not on file  Social Connections: Not on file  Intimate Partner Violence: Not on file    Outpatient Medications Prior to Visit  Medication Sig Dispense Refill  . allopurinol (ZYLOPRIM) 300 MG tablet TAKE 1 TABLET (300 MG TOTAL) BY MOUTH 2 (TWO) TIMES DAILY. TWICE DAILY REGARDLESS OF SYMPTOMS 180 tablet 1  . AMBULATORY NON FORMULARY MEDICATION Medication Name: Nebulizer with supplies including tubing etc.  Diagnosis COPD J44.9 1 vial 0  . BREO ELLIPTA 100-25 MCG/INH AEPB INHALE  1 PUFF BY MOUTH DAILY AS DIRECTED 60 each 11  . colchicine 0.6 MG tablet TAKE 1 TABLET BY MOUTH EVERY DAY 90 tablet 1  . indomethacin (INDOCIN) 50 MG capsule Take 1 capsule (50 mg total) by mouth 2 (two) times daily as needed for moderate pain (gout flare). 60 capsule 0  . Olmesartan-amLODIPine-HCTZ 40-10-25 MG TABS TAKE 1 TABLET BY MOUTH EVERY DAY 90 tablet 1  . omeprazole (PRILOSEC) 40 MG capsule TAKE 1 CAPSULE (40 MG TOTAL) BY MOUTH DAILY. TAKE 30 MIN BEFORE FIRST MEAL OF DAY. 90 capsule 3  . PROAIR HFA 108 (90 Base) MCG/ACT inhaler INHALE 2 PUFFS INTO THE LUNGS EVERY 6 (SIX) HOURS AS NEEDED. 18 g 3  . sildenafil (REVATIO) 20 MG tablet Take 2-5 tablets (40-100 mg total) by mouth daily as needed. 60 tablet 2  . albuterol (PROVENTIL) (2.5 MG/3ML) 0.083% nebulizer solution Take 3 mLs (2.5 mg total) by nebulization every 6 (six) hours as needed for wheezing or shortness of breath. 75 mL  12   No facility-administered medications prior to visit.    Allergies  Allergen Reactions  . Prednisone Other (See Comments)    Nightmares and suicidal thoughts    ROS Review of Systems    Objective:    Physical Exam Constitutional:      Appearance: He is well-developed.  HENT:     Head: Normocephalic and atraumatic.  Cardiovascular:     Rate and Rhythm: Normal rate and regular rhythm.     Heart sounds: Normal heart sounds.  Pulmonary:     Effort: Pulmonary effort is normal.     Breath sounds: Normal breath sounds.  Skin:    General: Skin is warm and dry.  Neurological:     Mental Status: He is alert and oriented to person, place, and time.  Psychiatric:        Behavior: Behavior normal.     BP 115/64   Pulse 93   Ht 5\' 11"  (1.803 m)   Wt 236 lb (107 kg)   SpO2 98%   BMI 32.92 kg/m  Wt Readings from Last 3 Encounters:  08/25/20 236 lb (107 kg)  02/25/20 224 lb 11.2 oz (101.9 kg)  04/30/19 234 lb (106.1 kg)     There are no preventive care reminders to display for this patient.  There are no preventive care reminders to display for this patient.  Lab Results  Component Value Date   TSH 1.000 07/01/2010   Lab Results  Component Value Date   WBC 4.1 01/12/2016   HGB 15.3 01/12/2016   HCT 44.9 01/12/2016   MCV 101.4 (H) 01/12/2016   PLT 199 01/12/2016   Lab Results  Component Value Date   NA 137 02/25/2020   K 3.8 02/25/2020   CO2 24 02/25/2020   GLUCOSE 110 (H) 02/25/2020   BUN 16 02/25/2020   CREATININE 1.22 02/25/2020   BILITOT 0.6 02/25/2020   ALKPHOS 62 01/12/2016   AST 20 02/25/2020   ALT 21 02/25/2020   PROT 7.2 02/25/2020   ALBUMIN 4.1 01/12/2016   CALCIUM 10.0 02/25/2020   Lab Results  Component Value Date   CHOL 174 07/04/2019   Lab Results  Component Value Date   HDL 57 07/04/2019   Lab Results  Component Value Date   LDLCALC 92 07/04/2019   Lab Results  Component Value Date   TRIG 158 (H) 07/04/2019   Lab  Results  Component Value Date   CHOLHDL 3.1 07/04/2019  Lab Results  Component Value Date   HGBA1C 5.0 02/06/2017      Assessment & Plan:   Problem List Items Addressed This Visit      Cardiovascular and Mediastinum   HYPERTENSION, BENIGN - Primary    Well controlled. Continue current regimen. Follow up in  6 mo       Relevant Orders   BASIC METABOLIC PANEL WITH GFR   Lipid Panel w/reflex Direct LDL   PSA     Respiratory   COPD (chronic obstructive pulmonary disease) (HCC)    Stable.  He does have prolonged expiration on exam.  We will see if we can find something more affordable than the [facility] though he has done well on it.  He will try to get me a copy of his formulary medications for inhalers       Other Visit Diagnoses    Screening for prostate cancer       Relevant Orders   BASIC METABOLIC PANEL WITH GFR   Lipid Panel w/reflex Direct LDL   PSA   Screening, lipid       Relevant Orders   BASIC METABOLIC PANEL WITH GFR   Lipid Panel w/reflex Direct LDL   PSA     Discussed that he will need a tetanus vaccine at the end of the year.  In addition we also discussed the second booster shot.   No orders of the defined types were placed in this encounter.   Follow-up: Return in about 6 months (around 02/24/2021) for Hypertension.    Beatrice Lecher, MD

## 2020-08-25 NOTE — Progress Notes (Signed)
   Pt has a bump at his R axilla x 2 weeks. Not as sore as it was.

## 2020-08-31 DIAGNOSIS — Z13220 Encounter for screening for lipoid disorders: Secondary | ICD-10-CM | POA: Diagnosis not present

## 2020-08-31 DIAGNOSIS — I10 Essential (primary) hypertension: Secondary | ICD-10-CM | POA: Diagnosis not present

## 2020-09-01 LAB — LIPID PANEL W/REFLEX DIRECT LDL
Cholesterol: 159 mg/dL (ref <200)
HDL: 71 mg/dL (ref >=40)
LDL Cholesterol (Calc): 67 mg/dL (calc)
Non-HDL Cholesterol (Calc): 88 mg/dL (calc) (ref <130)
Total CHOL/HDL Ratio: 2.2 (calc) (ref <5.0)
Triglycerides: 127 mg/dL (ref <150)

## 2020-09-01 LAB — BASIC METABOLIC PANEL WITH GFR
BUN: 16 mg/dL (ref 7–25)
CO2: 22 mmol/L (ref 20–32)
Calcium: 9.5 mg/dL (ref 8.6–10.3)
Chloride: 103 mmol/L (ref 98–110)
Creat: 1.08 mg/dL (ref 0.70–1.25)
GFR, Est African American: 82 mL/min/{1.73_m2} (ref >=60)
GFR, Est Non African American: 71 mL/min/{1.73_m2} (ref >=60)
Glucose, Bld: 107 mg/dL — ABNORMAL HIGH (ref 65–99)
Potassium: 4 mmol/L (ref 3.5–5.3)
Sodium: 136 mmol/L (ref 135–146)

## 2020-09-01 LAB — PSA: PSA: 0.9 ng/mL (ref <=4.0)

## 2020-10-15 ENCOUNTER — Telehealth: Payer: Self-pay | Admitting: Family Medicine

## 2020-10-15 NOTE — Chronic Care Management (AMB) (Signed)
  Chronic Care Management   Note  10/15/2020 Name: Edward Warren MRN: [id number] DOB: November 04, 1955  Edward Warren is a 65 y.o. year old male who is a primary care patient of Metheney, Rene Kocher, MD. I reached out to Edward Warren by phone today in response to a referral sent by Edward Warren's PCP, Hali Marry, MD.   [name] was given information about Chronic Care Management services today including:  1. CCM service includes personalized support from designated clinical staff supervised by his physician, including individualized plan of care and coordination with other care providers 2. 24/7 contact phone numbers for assistance for urgent and routine care needs. 3. Service will only be billed when office clinical staff spend 20 minutes or more in a month to coordinate care. 4. Only one practitioner may furnish and bill the service in a calendar month. 5. The patient may stop CCM services at any time (effective at the end of the month) by phone call to the office staff.   Patient agreed to services and verbal consent obtained.   Follow up plan:   Lauretta Grill Upstream Scheduler

## 2020-10-15 NOTE — Progress Notes (Signed)
  Chronic Care Management   Outreach Note  10/15/2020 Name: Edward Warren MRN: [id number] DOB: 1955-05-29  Referred by: Hali Marry, MD Reason for referral : No chief complaint on file.   An unsuccessful telephone outreach was attempted today. The patient was referred to the pharmacist for assistance with care management and care coordination.   Follow Up Plan:   Lauretta Grill Upstream Scheduler

## 2020-11-16 ENCOUNTER — Other Ambulatory Visit: Payer: Self-pay | Admitting: Family Medicine

## 2020-11-16 DIAGNOSIS — J449 Chronic obstructive pulmonary disease, unspecified: Secondary | ICD-10-CM

## 2020-11-19 ENCOUNTER — Other Ambulatory Visit: Payer: Self-pay | Admitting: Family Medicine

## 2020-11-19 DIAGNOSIS — R1013 Epigastric pain: Secondary | ICD-10-CM

## 2020-11-24 ENCOUNTER — Ambulatory Visit (INDEPENDENT_AMBULATORY_CARE_PROVIDER_SITE_OTHER): Payer: Medicare Other | Admitting: Pharmacist

## 2020-11-24 ENCOUNTER — Other Ambulatory Visit: Payer: Self-pay

## 2020-11-24 DIAGNOSIS — J449 Chronic obstructive pulmonary disease, unspecified: Secondary | ICD-10-CM

## 2020-11-24 DIAGNOSIS — F17200 Nicotine dependence, unspecified, uncomplicated: Secondary | ICD-10-CM

## 2020-11-24 DIAGNOSIS — I10 Essential (primary) hypertension: Secondary | ICD-10-CM

## 2020-11-24 DIAGNOSIS — M1A09X0 Idiopathic chronic gout, multiple sites, without tophus (tophi): Secondary | ICD-10-CM

## 2020-11-24 NOTE — Progress Notes (Signed)
Chronic Care Management Pharmacy Note  11/24/2020 Name:  Edward Warren MRN:  [id number] DOB:  1956/01/18  Summary: addressed HTN, COPD, gout, tobacco use  Recommendations/Changes made from today's visit: no medication changes, initiating paperwork for cost assistance for Breo ellipta and colchicine.  Plan: pt to bring income documents next Wednesday July 13, f/u with pharmacist in 2 months  Subjective: Edward Warren is an 65 y.o. year old male who is a primary patient of Metheney, Rene Kocher, MD.  The CCM team was consulted for assistance with disease management and care coordination needs.    Engaged with patient face to face for initial visit in response to provider referral for pharmacy case management and/or care coordination services.   Consent to Services:  The patient was given information about Chronic Care Management services, agreed to services, and gave verbal consent prior to initiation of services.  Please see initial visit note for detailed documentation.   Patient Care Team: Hali Marry, MD as PCP - General Darius Bump, [facility] as Pharmacist (Pharmacist)   Objective:  Lab Results  Component Value Date   CREATININE 1.08 08/31/2020   CREATININE 1.22 02/25/2020   CREATININE 1.10 07/04/2019    Lab Results  Component Value Date   HGBA1C 5.0 02/06/2017       Component Value Date/Time   CHOL 159 08/31/2020 0000   TRIG 127 08/31/2020 0000   HDL 71 08/31/2020 0000   CHOLHDL 2.2 08/31/2020 0000   VLDL 20 03/08/2015 1202   LDLCALC 67 08/31/2020 0000    Hepatic Function Latest Ref Rng & Units 02/25/2020 11/25/2018 04/03/2018  Total Protein 6.1 - 8.1 g/dL 7.2 6.9 7.1  Albumin 3.6 - 5.1 g/dL - - -  AST 10 - 35 U/L 20 38(H) 21  ALT 9 - 46 U/L $Remo'21 29 20  'WjOKX$ Alk Phosphatase 40 - 115 U/L - - -  Total Bilirubin 0.2 - 1.2 mg/dL 0.6 0.8 0.8    Lab Results  Component Value Date/Time   TSH 1.000 07/01/2010 08:57 PM   TSH 2.321 02/08/2009 12:00 AM     CBC Latest Ref Rng & Units 01/12/2016 07/01/2010 07/04/2009  WBC 3.8 - 10.8 K/uL 4.1 3.4(L) 6.4  Hemoglobin 13.2 - 17.1 g/dL 15.3 15.3 14.9  Hematocrit 38.5 - 50.0 % 44.9 44.5 44.0  Platelets 140 - 400 K/uL 199 194 192    Social History   Tobacco Use  Smoking Status Every Day   Packs/day: 0.50   Years: 42.00   Pack years: 21.00   Types: Cigarettes  Smokeless Tobacco Never  Tobacco Comments   Smokes 3-4 cigarettes a week 12/8   BP Readings from Last 3 Encounters:  08/25/20 115/64  02/25/20 132/86  04/30/19 118/84   Pulse Readings from Last 3 Encounters:  08/25/20 93  02/25/20 90  04/30/19 (!) 112   Wt Readings from Last 3 Encounters:  08/25/20 236 lb (107 kg)  02/25/20 224 lb 11.2 oz (101.9 kg)  04/30/19 234 lb (106.1 kg)    Assessment: Review of patient past medical history, allergies, medications, health status, including review of consultants reports, laboratory and other test data, was performed as part of comprehensive evaluation and provision of chronic care management services.   SDOH:  (Social Determinants of Health) assessments and interventions performed:    CCM Care Plan  Allergies  Allergen Reactions   Prednisone Other (See Comments)    Nightmares and suicidal thoughts    Medications Reviewed Today  Reviewed by Darius Bump, [place] (Pharmacist) on 11/24/20 at 1117  Med List Status: <None>   Medication Order Taking? Sig Documenting Provider Last Dose Status Informant  allopurinol (ZYLOPRIM) 300 MG tablet [id number] Yes TAKE 1 TABLET (300 MG TOTAL) BY MOUTH 2 (TWO) TIMES DAILY. TWICE DAILY REGARDLESS OF SYMPTOMS Silverio Decamp, MD Taking Active            Med Note Dorene Ar Nov 24, 2020 11:12 AM) Taking once daily  AMBULATORY Baruch Gouty MEDICATION [id number] Yes Medication Name: Nebulizer with supplies including tubing etc.  Diagnosis COPD J44.9 Hali Marry, MD Taking Active   BREO ELLIPTA 100-25 MCG/INH AEPB  [id number] Yes INHALE 1 PUFF BY MOUTH DAILY AS DIRECTED Hali Marry, MD Taking Active   colchicine 0.6 MG tablet [id number] Yes TAKE 1 TABLET BY MOUTH EVERY DAY Hali Marry, MD Taking Active   indomethacin (INDOCIN) 50 MG capsule [id number] Yes Take 1 capsule (50 mg total) by mouth 2 (two) times daily as needed for moderate pain (gout flare). Silverio Decamp, MD Taking Active   Olmesartan-amLODIPine-HCTZ 40-10-25 MG TABS [id number] Yes TAKE 1 TABLET BY MOUTH EVERY DAY Hali Marry, MD Taking Active   omeprazole (PRILOSEC) 40 MG capsule [id number] No TAKE 1 CAPSULE (40 MG TOTAL) BY MOUTH DAILY. TAKE 30 MIN BEFORE FIRST MEAL OF DAY.  Patient not taking: Reported on 11/24/2020   Hali Marry, MD Not Taking Active   PROAIR HFA [phone] Base) MCG/ACT inhaler [id number] Yes INHALE 2 PUFFS INTO THE LUNGS EVERY 6 (SIX) HOURS AS NEEDED. Hali Marry, MD Taking Active   Patient not taking:  Discontinued 11/24/20 1117 (Patient Preference)             Patient Active Problem List   Diagnosis Date Noted   Ingrown toenail of left foot 07/04/2019   Primary osteoarthritis of left knee 02/11/2019   Gout and pseudogout 10/30/2018   COPD (chronic obstructive pulmonary disease) ([place]) 10/23/2012   Personal history of colonic adenomas 07/05/2012   Severe mitral valve stenosis 81/44/8185   Diastolic dysfunction 63/14/9702   HYPOGONADISM 07/01/2010   MEIBOMIAN CYST 12/08/2009   TOBACCO ABUSE 10/26/2009   ALCOHOL ABUSE 02/08/2009   H/O asbestos exposure 02/08/2009   GERD 09/09/2008   HYPERTENSION, BENIGN 08/05/2008    Immunization History  Administered Date(s) Administered   Fluad Quad(high Dose 65+) 04/09/2019, 02/25/2020   Influenza,inj,Quad PF,6+ Mos 02/20/2014, 03/08/2015, 01/12/2016, 03/28/2018   PFIZER(Purple Top)SARS-COV-2 Vaccination 07/15/2019, 08/07/2019, 03/22/2020   Pneumococcal Polysaccharide-23 02/20/2014   Tdap 04/19/2011    Conditions to  be addressed/monitored: HTN, COPD, and gout, tobacco use  Care Plan : Medication Management  Updates made by Darius Bump, [place] since 11/24/2020 12:00 AM     Problem: HTN, COPD, Gout, tobacco use      Long-Range Goal: Disease Progression Prevention   Start Date: 11/24/2020  This Visit's Progress: On track  Priority: High  Note:   Current Barriers:  Unable to independently afford treatment regimen  Pharmacist Clinical Goal(s):  Over the next 60 days, patient will verbalize ability to afford treatment regimen through collaboration with PharmD and provider.   Interventions: 1:1 collaboration with Hali Marry, MD regarding development and update of comprehensive plan of care as evidenced by provider attestation and co-signature Inter-disciplinary care team collaboration (see longitudinal plan of care) Comprehensive medication review performed; medication list updated in electronic medical record  Hypertension:  Controlled; current treatment:olmesartan-amlodipine-hctz 40-10-25mg  daily;  Current home readings: 116-122/ 70s   Denies hypotensive/hypertensive symptoms  Recommended continue current regimen,  Chronic Obstructive Pulmonary Disease:  Uncontrolled/controlled; current treatment:breo ellipta 100-9mcg daily, proair HFA PRN ;   zero exacerbations requiring treatment in the last 6 months   Recommended continue current regimen Assessed patient finances. Patient may be eligible for cost assistance for Breo ellipta, will initiate paperwork, advised against changing inhalers d/t cost as others are likely similar in cost and patient reports breo is efficacious,   Gout  Controlled; current treatment:allopurinol 300mg  BID, colchicine 0.6mg  daily, indomethacin 50mg  BID PRN;   Counseled on appropriate use and differences between medications Recommended continue current regimen Assessed patient finances. Patient may be eligible for assistance with colchicine cost, will initiate  paperwork,   Tobacco Abuse:  1/2 packs per day  Previous quit attempts: unsuccessful using patches, lasted 6 weeks  Motivation to quit smoking: does not wish to quit smoking at this time  Counseled on benefits of smoking cessation Recommended continue open mind for ongoing discussions and offered support for smoking cessation  Patient Goals/Self-Care Activities Over the next 60 days, patient will:  collaborate with provider on medication access solutions  Follow Up Plan: Telephone follow up appointment with care management team member scheduled for:  2 months      Medication Assistance:  Initiating paperwork for cost assistance for breo ellipta and colchicine, acceptance/coverage TBD.  Patient's preferred pharmacy is:  [id number] [place], [place] - [address] [place] [address] Phone: [phone] Fax: [phone]  Uses pill box? No - keeps in medication bottles in a cabinet Pt endorses 100% compliance  Follow Up:  Patient agrees to Care Plan and Follow-up.  Plan: Telephone follow up appointment with care management team member scheduled for:  2 months  Darius Bump

## 2020-11-24 NOTE — Patient Instructions (Signed)
Visit Information   PATIENT GOALS:   Goals Addressed             This Visit's Progress    Medication Management       Patient Goals/Self-Care Activities Over the next 60 days, patient will:  collaborate with provider on medication access solutions  Follow Up Plan: Telephone follow up appointment with care management team member scheduled for:  2 months          Consent to CCM Services: Mr. Gayden was given information about Chronic Care Management services today including:  CCM service includes personalized support from designated clinical staff supervised by his physician, including individualized plan of care and coordination with other care providers 24/7 contact phone numbers for assistance for urgent and routine care needs. Service will only be billed when office clinical staff spend 20 minutes or more in a month to coordinate care. Only one practitioner may furnish and bill the service in a calendar month. The patient may stop CCM services at any time (effective at the end of the month) by phone call to the office staff. The patient will be responsible for cost sharing (co-pay) of up to 20% of the service fee (after annual deductible is met).  Patient agreed to services and verbal consent obtained.   The patient verbalized understanding of instructions, educational materials, and care plan provided today and agreed to receive a mailed copy of patient instructions, educational materials, and care plan.   Telephone follow up appointment with care management team member scheduled for: 2 months  CLINICAL CARE PLAN: Patient Care Plan: Medication Management     Problem Identified: HTN, COPD, Gout, tobacco use      Long-Range Goal: Disease Progression Prevention   Start Date: 11/24/2020  This Visit's Progress: On track  Priority: High  Note:   Current Barriers:  Unable to independently afford treatment regimen  Pharmacist Clinical Goal(s):  Over the next 60 days,  patient will verbalize ability to afford treatment regimen through collaboration with PharmD and provider.   Interventions: 1:1 collaboration with Hali Marry, MD regarding development and update of comprehensive plan of care as evidenced by provider attestation and co-signature Inter-disciplinary care team collaboration (see longitudinal plan of care) Comprehensive medication review performed; medication list updated in electronic medical record  Hypertension:  Controlled; current treatment:olmesartan-amlodipine-hctz 40-10-25mg daily;   Current home readings: 116-122/ 70s   Denies hypotensive/hypertensive symptoms  Recommended continue current regimen,  Chronic Obstructive Pulmonary Disease:  Uncontrolled/controlled; current treatment:breo ellipta 100-44mg daily, proair HFA PRN ;   zero exacerbations requiring treatment in the last 6 months   Recommended continue current regimen Assessed patient finances. Patient may be eligible for cost assistance for Breo ellipta, will initiate paperwork, advised against changing inhalers d/t cost as others are likely similar in cost and patient reports breo is efficacious,   Gout  Controlled; current treatment:allopurinol 3047mBID, colchicine 0.54m61maily, indomethacin 32m78mD PRN;   Counseled on appropriate use and differences between medications Recommended continue current regimen Assessed patient finances. Patient may be eligible for assistance with colchicine cost, will initiate paperwork,   Tobacco Abuse:  1/2 packs per day  Previous quit attempts: unsuccessful using patches, lasted 6 weeks  Motivation to quit smoking: does not wish to quit smoking at this time  Counseled on benefits of smoking cessation Recommended continue open mind for ongoing discussions and offered support for smoking cessation  Patient Goals/Self-Care Activities Over the next 60 days, patient will:  collaborate with provider  on medication access  solutions  Follow Up Plan: Telephone follow up appointment with care management team member scheduled for:  2 months

## 2021-02-01 ENCOUNTER — Telehealth: Payer: Medicare Other

## 2021-02-01 NOTE — Progress Notes (Deleted)
Current Barriers:  Unable to independently afford treatment regimen   Pharmacist Clinical Goal(s):  Over the next 60 days, patient will verbalize ability to afford treatment regimen through collaboration with PharmD and provider.    Interventions: 1:1 collaboration with Hali Marry, MD regarding development and update of comprehensive plan of care as evidenced by provider attestation and co-signature Inter-disciplinary care team collaboration (see longitudinal plan of care) Comprehensive medication review performed; medication list updated in electronic medical record   Hypertension:            Controlled; current treatment:olmesartan-amlodipine-hctz 40-10-25mg  daily;             Current home readings: 116-122/ 70s             Denies hypotensive/hypertensive symptoms            Recommended continue current regimen,  Chronic Obstructive Pulmonary Disease:            Uncontrolled/controlled; current treatment:breo ellipta 100-82mcg daily, proair HFA PRN ;             zero exacerbations requiring treatment in the last 6 months             Recommended continue current regimen Assessed patient finances. Patient may be eligible for cost assistance for Breo ellipta, will initiate paperwork, advised against changing inhalers d/t cost as others are likely similar in cost and patient reports breo is efficacious,    Gout            Controlled; current treatment:allopurinol 300mg  BID, colchicine 0.6mg  daily, indomethacin 50mg  BID PRN;             Counseled on appropriate use and differences between medications Recommended continue current regimen Assessed patient finances. Patient may be eligible for assistance with colchicine cost, will initiate paperwork,    Tobacco Abuse:            1/2 packs per day            Previous quit attempts: unsuccessful using patches, lasted 6 weeks            Motivation to quit smoking: does not wish to quit smoking at this time            Counseled on  benefits of smoking cessation Recommended continue open mind for ongoing discussions and offered support for smoking cessation   Patient Goals/Self-Care Activities Over the next 60 days, patient will:  collaborate with provider on medication access solutions   Follow Up Plan: Telephone follow up appointment with care management team member scheduled for:  2 months      Chronic Care Management Pharmacy Note  02/01/2021 Name:  Edward Warren MRN:  [id number] DOB:  03/10/1956  Summary:  Recommendations/Changes made from today's visit:  Plan:  Subjective: Edward Warren is an 65 y.o. year old male who is a primary patient of Metheney, Rene Kocher, MD.  The CCM team was consulted for assistance with disease management and care coordination needs.    {CCMTELEPHONEFACETOFACE:21091510} for {CCMINITIALFOLLOWUPCHOICE:21091511} in response to provider referral for pharmacy case management and/or care coordination services.   Consent to Services:  {CCMCONSENTOPTIONS:25074}  Patient Care Team: Hali Marry, MD as PCP - General Darius Bump, [facility] as Pharmacist (Pharmacist)  Recent office visits: ***  Recent consult visits: [facility] visits: {Hospital DC Yes/No:21091515}  Objective:  Lab Results  Component Value Date   CREATININE 1.08 08/31/2020   CREATININE 1.22 02/25/2020   CREATININE 1.10 07/04/2019  Lab Results  Component Value Date   HGBA1C 5.0 02/06/2017   Last diabetic Eye exam: No results found for: HMDIABEYEEXA  Last diabetic Foot exam: No results found for: HMDIABFOOTEX      Component Value Date/Time   CHOL 159 08/31/2020 0000   TRIG 127 08/31/2020 0000   HDL 71 08/31/2020 0000   CHOLHDL 2.2 08/31/2020 0000   VLDL 20 03/08/2015 1202   LDLCALC 67 08/31/2020 0000    Hepatic Function Latest Ref Rng & Units 02/25/2020 11/25/2018 04/03/2018  Total Protein 6.1 - 8.1 g/dL 7.2 6.9 7.1  Albumin 3.6 - 5.1 g/dL - - -  AST 10 - 35 U/L 20  38(H) 21  ALT 9 - 46 U/L $Remo'21 29 20  'SvynQ$ Alk Phosphatase 40 - 115 U/L - - -  Total Bilirubin 0.2 - 1.2 mg/dL 0.6 0.8 0.8    Lab Results  Component Value Date/Time   TSH 1.000 07/01/2010 08:57 PM   TSH 2.321 02/08/2009 12:00 AM    CBC Latest Ref Rng & Units 01/12/2016 07/01/2010 07/04/2009  WBC 3.8 - 10.8 K/uL 4.1 3.4(L) 6.4  Hemoglobin 13.2 - 17.1 g/dL 15.3 15.3 14.9  Hematocrit 38.5 - 50.0 % 44.9 44.5 44.0  Platelets 140 - 400 K/uL 199 194 192    No results found for: VD25OH  Clinical ASCVD: {YES/NO:21197} The 10-year ASCVD risk score (Arnett DK, et al., 2019) is: 12.6%   Values used to calculate the score:     Age: 54 years     Sex: Male     Is Non-Hispanic African American: Yes     Diabetic: No     Tobacco smoker: Yes     Systolic Blood Pressure: 088 mmHg     Is BP treated: No     HDL Cholesterol: 71 mg/dL     Total Cholesterol: 159 mg/dL    Other: (CHADS2VASc if Afib, PHQ9 if depression, MMRC or CAT for COPD, ACT, DEXA)  Social History   Tobacco Use  Smoking Status Every Day   Packs/day: 0.50   Years: 42.00   Pack years: 21.00   Types: Cigarettes  Smokeless Tobacco Never  Tobacco Comments   Smokes 3-4 cigarettes a week 12/8   BP Readings from Last 3 Encounters:  08/25/20 115/64  02/25/20 132/86  04/30/19 118/84   Pulse Readings from Last 3 Encounters:  08/25/20 93  02/25/20 90  04/30/19 (!) 112   Wt Readings from Last 3 Encounters:  08/25/20 236 lb (107 kg)  02/25/20 224 lb 11.2 oz (101.9 kg)  04/30/19 234 lb (106.1 kg)    Assessment: Review of patient past medical history, allergies, medications, health status, including review of consultants reports, laboratory and other test data, was performed as part of comprehensive evaluation and provision of chronic care management services.   SDOH:  (Social Determinants of Health) assessments and interventions performed:    CCM Care Plan  Allergies  Allergen Reactions   Prednisone Other (See Comments)     Nightmares and suicidal thoughts    Medications Reviewed Today     Reviewed by Darius Bump, [facility] (Pharmacist) on 11/24/20 at 1117  Med List Status: <None>   Medication Order Taking? Sig Documenting Provider Last Dose Status Informant  allopurinol (ZYLOPRIM) 300 MG tablet [id number] Yes TAKE 1 TABLET (300 MG TOTAL) BY MOUTH 2 (TWO) TIMES DAILY. TWICE DAILY REGARDLESS OF SYMPTOMS Silverio Decamp, MD Taking Active            Med Note Jens Som,  Felecia Shelling   Wed Nov 24, 2020 11:12 AM) Taking once daily  AMBULATORY NON [place] [id number] Yes Medication Name: Nebulizer with supplies including tubing etc.  Diagnosis COPD J44.9 Hali Marry, MD Taking Active   BREO ELLIPTA 100-25 MCG/INH AEPB [id number] Yes INHALE 1 PUFF BY MOUTH DAILY AS DIRECTED Hali Marry, MD Taking Active   colchicine 0.6 MG tablet [id number] Yes TAKE 1 TABLET BY MOUTH EVERY DAY Hali Marry, MD Taking Active   indomethacin (INDOCIN) 50 MG capsule [id number] Yes Take 1 capsule (50 mg total) by mouth 2 (two) times daily as needed for moderate pain (gout flare). Silverio Decamp, MD Taking Active   Olmesartan-amLODIPine-HCTZ 40-10-25 MG TABS [id number] Yes TAKE 1 TABLET BY MOUTH EVERY DAY Hali Marry, MD Taking Active   omeprazole (PRILOSEC) 40 MG capsule [id number] No TAKE 1 CAPSULE (40 MG TOTAL) BY MOUTH DAILY. TAKE 30 MIN BEFORE FIRST MEAL OF DAY.  Patient not taking: Reported on 11/24/2020   Hali Marry, MD Not Taking Active   PROAIR HFA 108 [phone] Base) MCG/ACT inhaler [id number] Yes INHALE 2 PUFFS INTO THE LUNGS EVERY 6 (SIX) HOURS AS NEEDED. Hali Marry, MD Taking Active   Patient not taking:  Discontinued 11/24/20 1117 (Patient Preference)             Patient Active Problem List   Diagnosis Date Noted   Ingrown toenail of left foot 07/04/2019   Primary osteoarthritis of left knee 02/11/2019   Gout and pseudogout 10/30/2018   COPD (chronic  obstructive pulmonary disease) ([place]) 10/23/2012   Personal history of colonic adenomas 07/05/2012   Severe mitral valve stenosis 68/37/2902   Diastolic dysfunction 04/06/5207   HYPOGONADISM 07/01/2010   MEIBOMIAN CYST 12/08/2009   TOBACCO ABUSE 10/26/2009   ALCOHOL ABUSE 02/08/2009   H/O asbestos exposure 02/08/2009   GERD 09/09/2008   HYPERTENSION, BENIGN 08/05/2008    Immunization History  Administered Date(s) Administered   Fluad Quad(high Dose 65+) 04/09/2019, 02/25/2020   Influenza,inj,Quad PF,6+ Mos 02/20/2014, 03/08/2015, 01/12/2016, 03/28/2018   PFIZER(Purple Top)SARS-COV-2 Vaccination 07/15/2019, 08/07/2019, 03/22/2020   Pneumococcal Polysaccharide-23 02/20/2014   Tdap 04/19/2011    Conditions to be addressed/monitored: {CCM ASSESSMENT DISEASE OPTIONS:25047}  There are no care plans that you recently modified to display for this patient.   Medication Assistance: {MEDASSISTANCEINFO:25044}  Patient's preferred pharmacy is:  [id number] [address] [place], [place] [address] [address] Phone: [phone] Fax: [phone]  Uses pill box? {Yes or If no, why not?:20788} Pt endorses ***% compliance  Follow Up:  {FOLLOWUP:24991}  Plan: {CM FOLLOW UP PLAN:25073}  SIG***

## 2021-02-11 ENCOUNTER — Other Ambulatory Visit: Payer: Self-pay | Admitting: Family Medicine

## 2021-02-11 DIAGNOSIS — M10071 Idiopathic gout, right ankle and foot: Secondary | ICD-10-CM

## 2021-02-11 DIAGNOSIS — M1A0690 Idiopathic chronic gout, unspecified knee, without tophus (tophi): Secondary | ICD-10-CM

## 2021-02-11 DIAGNOSIS — M1A09X0 Idiopathic chronic gout, multiple sites, without tophus (tophi): Secondary | ICD-10-CM

## 2021-02-23 ENCOUNTER — Other Ambulatory Visit: Payer: Self-pay | Admitting: Family Medicine

## 2021-02-23 DIAGNOSIS — M1A09X0 Idiopathic chronic gout, multiple sites, without tophus (tophi): Secondary | ICD-10-CM

## 2021-02-24 ENCOUNTER — Encounter: Payer: Self-pay | Admitting: Family Medicine

## 2021-02-24 ENCOUNTER — Other Ambulatory Visit: Payer: Self-pay

## 2021-02-24 ENCOUNTER — Ambulatory Visit (INDEPENDENT_AMBULATORY_CARE_PROVIDER_SITE_OTHER): Payer: Medicare Other | Admitting: Family Medicine

## 2021-02-24 VITALS — BP 116/84 | HR 91 | Ht 71.0 in

## 2021-02-24 DIAGNOSIS — M1A09X0 Idiopathic chronic gout, multiple sites, without tophus (tophi): Secondary | ICD-10-CM

## 2021-02-24 DIAGNOSIS — Z23 Encounter for immunization: Secondary | ICD-10-CM | POA: Diagnosis not present

## 2021-02-24 DIAGNOSIS — Z77090 Contact with and (suspected) exposure to asbestos: Secondary | ICD-10-CM

## 2021-02-24 DIAGNOSIS — I10 Essential (primary) hypertension: Secondary | ICD-10-CM | POA: Diagnosis not present

## 2021-02-24 DIAGNOSIS — J449 Chronic obstructive pulmonary disease, unspecified: Secondary | ICD-10-CM

## 2021-02-24 NOTE — Patient Instructions (Signed)
Please get your Tdap vaccine updated at the pharmacy.

## 2021-02-24 NOTE — Assessment & Plan Note (Signed)
Well controlled. Continue current regimen. Follow up in  6 mo  

## 2021-02-24 NOTE — Assessment & Plan Note (Signed)
Follows up with Dr. Pearlie Oyster in December.  Continue current regimen.

## 2021-02-24 NOTE — Assessment & Plan Note (Signed)
Continue current regimen of daily allopurinol

## 2021-02-24 NOTE — Progress Notes (Signed)
Established Patient Office Visit  Subjective:  Patient ID: Edward Warren, male    DOB: 1955-08-30  Age: 65 y.o. MRN: [id number]  CC:  Chief Complaint  Patient presents with   Hypertension   . HPI Edward Warren presents for   Hypertension- Pt denies chest pain, SOB, dizziness, or heart palpitations.  Taking meds as directed w/o problems.  Denies medication side effects.    Gout-overall he is doing really well he has not had any recent flares or exacerbations he takes his allopurinol regularly. Lab Results  Component Value Date   LABURIC 4.5 02/25/2020   COPD-he is doing well he says he is using his Memory [employer] regularly has not had any affordability issues with it he says he rarely uses his albuterol and has not had to use it lately.  He follows up with pulmonary in December.   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[place])    per patient dx by ct scan 2012   COPD (chronic obstructive pulmonary disease) ([facility])    PFTS: 7-10: TEV1 ration of 62, FEV1 63% and FVC 80% (mod, class II)   Emphysema of lung ([facility])    History of shingles    Hypertension    Personal history of colonic adenomas 07/05/2012    Past Surgical History:  Procedure Laterality Date   COLONOSCOPY  multiple    Family History  Problem Relation Age of Onset   COPD Mother    Heart attack Father    Stroke Maternal Grandfather    Breast cancer Maternal Aunt    Cancer Maternal Uncle        throat   Esophageal cancer Maternal Uncle    Colon cancer Neg Hx    Colon polyps Neg Hx    Rectal cancer Neg Hx    Stomach cancer Neg Hx     Social History   Socioeconomic History   Marital status: Married    Spouse name: Not on file   Number of children: 1   Years of education: Not on file   Highest education level: Not on file  Occupational History   Occupation: disabled    Comment: [employer]  Tobacco Use   Smoking status: Every Day    Packs/day: 0.50    Years:  42.00    Pack years: 21.00    Types: Cigarettes   Smokeless tobacco: Never   Tobacco comments:    Smokes 3-4 cigarettes a week 12/8  Vaping Use   Vaping Use: Never used  Substance and Sexual Activity   Alcohol use: Yes    Alcohol/week: 3.0 standard drinks    Types: 3 Standard drinks or equivalent per week   Drug use: No   Sexual activity: Not on file  Other Topics Concern   Not on file  Social History Narrative   No regular exercise.    Social Determinants of Health   Financial Resource Strain: Not on file  Food Insecurity: Not on file  Transportation Needs: Not on file  Physical Activity: Not on file  Stress: Not on file  Social Connections: Not on file  Intimate Partner Violence: Not on file    Outpatient Medications Prior to Visit  Medication Sig Dispense Refill   allopurinol (ZYLOPRIM) 300 MG tablet TAKE 1 TABLET (300 MG TOTAL) BY MOUTH 2 (TWO) TIMES DAILY REGARDLESS OF SYMPTOMS 180 tablet 1   AMBULATORY NON FORMULARY MEDICATION Medication Name: Nebulizer with  supplies including tubing etc.  Diagnosis COPD J44.9 1 vial 0   BREO ELLIPTA 100-25 MCG/INH AEPB INHALE 1 PUFF BY MOUTH DAILY AS DIRECTED 60 each 11   colchicine 0.6 MG tablet TAKE 1 TABLET BY MOUTH EVERY DAY 90 tablet 1   indomethacin (INDOCIN) 50 MG capsule Take 1 capsule (50 mg total) by mouth 2 (two) times daily as needed for moderate pain (gout flare). 60 capsule 0   Olmesartan-amLODIPine-HCTZ 40-10-25 MG TABS TAKE 1 TABLET BY MOUTH EVERY DAY 90 tablet 1   omeprazole (PRILOSEC) 40 MG capsule TAKE 1 CAPSULE (40 MG TOTAL) BY MOUTH DAILY. TAKE 30 MIN BEFORE FIRST MEAL OF DAY. (Patient not taking: Reported on 11/24/2020) 90 capsule 3   PROAIR HFA 108 (90 Base) MCG/ACT inhaler INHALE 2 PUFFS INTO THE LUNGS EVERY 6 (SIX) HOURS AS NEEDED. 18 g 3   No facility-administered medications prior to visit.    Allergies  Allergen Reactions   Prednisone Other (See Comments)    Nightmares and suicidal thoughts     ROS Review of Systems    Objective:    Physical Exam Constitutional:      Appearance: Normal appearance. He is well-developed.  HENT:     Head: Normocephalic and atraumatic.  Cardiovascular:     Rate and Rhythm: Normal rate and regular rhythm.     Heart sounds: Normal heart sounds.  Pulmonary:     Effort: Pulmonary effort is normal.     Breath sounds: Wheezing present.     Comments: Expiratory wheeze in the right lower lung.  Clear elsewhere. Skin:    General: Skin is warm and dry.  Neurological:     Mental Status: He is alert and oriented to person, place, and time. Mental status is at baseline.  Psychiatric:        Behavior: Behavior normal.    BP 116/84   Pulse 91   Ht 5\' 11"  (1.803 m)   SpO2 99%   BMI 32.92 kg/m  Wt Readings from Last 3 Encounters:  08/25/20 236 lb (107 kg)  02/25/20 224 lb 11.2 oz (101.9 kg)  04/30/19 234 lb (106.1 kg)     Health Maintenance Due  Topic Date Due   Zoster Vaccines- Shingrix (1 of 2) Never done   COVID-19 Vaccine (4 - Booster for Pfizer series) 07/20/2020    There are no preventive care reminders to display for this patient.  Lab Results  Component Value Date   TSH 1.000 07/01/2010   Lab Results  Component Value Date   WBC 4.1 01/12/2016   HGB 15.3 01/12/2016   HCT 44.9 01/12/2016   MCV 101.4 (H) 01/12/2016   PLT 199 01/12/2016   Lab Results  Component Value Date   NA 136 08/31/2020   K 4.0 08/31/2020   CO2 22 08/31/2020   GLUCOSE 107 (H) 08/31/2020   BUN 16 08/31/2020   CREATININE 1.08 08/31/2020   BILITOT 0.6 02/25/2020   ALKPHOS 62 01/12/2016   AST 20 02/25/2020   ALT 21 02/25/2020   PROT 7.2 02/25/2020   ALBUMIN 4.1 01/12/2016   CALCIUM 9.5 08/31/2020   Lab Results  Component Value Date   CHOL 159 08/31/2020   Lab Results  Component Value Date   HDL 71 08/31/2020   Lab Results  Component Value Date   LDLCALC 67 08/31/2020   Lab Results  Component Value Date   TRIG 127 08/31/2020    Lab Results  Component Value Date   CHOLHDL 2.2 08/31/2020  Lab Results  Component Value Date   HGBA1C 5.0 02/06/2017      Assessment & Plan:   Problem List Items Addressed This Visit       Cardiovascular and Mediastinum   HYPERTENSION, BENIGN - Primary    Well controlled. Continue current regimen. Follow up in  6 mo       Relevant Orders   BASIC METABOLIC PANEL WITH GFR   Uric acid     Respiratory   COPD (chronic obstructive pulmonary disease) ([place])    Follows up with Dr. Pearlie Oyster in December.  Continue current regimen.        Other   H/O asbestos exposure   Gout and pseudogout    Continue current regimen of daily allopurinol      Relevant Orders   Uric acid   Other Visit Diagnoses     Need for influenza vaccination       Relevant Orders   Flu Vaccine QUAD High Dose(Fluad) (Completed)   Need for pneumococcal vaccination       Relevant Orders   Pneumococcal conjugate vaccine 20-valent (Prevnar 20) (Completed)       Given high-dose flu vaccine and Prevnar 20 today. Commended he get his tetanus updated next month in November it will have been 10 years.  He is planning on getting his by [facility] in the next few weeks as well.  No orders of the defined types were placed in this encounter.   Follow-up: Return in about 6 months (around 08/25/2021) for Hypertension.    Beatrice Lecher, MD

## 2021-02-25 LAB — BASIC METABOLIC PANEL WITH GFR
BUN: 15 mg/dL (ref 7–25)
CO2: 22 mmol/L (ref 20–32)
Calcium: 9.5 mg/dL (ref 8.6–10.3)
Chloride: 106 mmol/L (ref 98–110)
Creat: 1.09 mg/dL (ref 0.70–1.35)
Glucose, Bld: 105 mg/dL — ABNORMAL HIGH (ref 65–99)
Potassium: 4.1 mmol/L (ref 3.5–5.3)
Sodium: 137 mmol/L (ref 135–146)
eGFR: 74 mL/min/{1.73_m2} (ref >=60)

## 2021-02-25 LAB — URIC ACID: Uric Acid, Serum: 5.3 mg/dL (ref 4.0–8.0)

## 2021-02-25 NOTE — Progress Notes (Signed)
Your lab work is within acceptable range and there are no concerning findings.   ?

## 2021-03-19 ENCOUNTER — Other Ambulatory Visit: Payer: Self-pay | Admitting: Family Medicine

## 2021-03-19 DIAGNOSIS — I10 Essential (primary) hypertension: Secondary | ICD-10-CM

## 2021-05-18 ENCOUNTER — Other Ambulatory Visit: Payer: Self-pay

## 2021-05-18 DIAGNOSIS — J449 Chronic obstructive pulmonary disease, unspecified: Secondary | ICD-10-CM

## 2021-05-18 MED ORDER — ALBUTEROL SULFATE HFA 108 (90 BASE) MCG/ACT IN AERS
INHALATION_SPRAY | RESPIRATORY_TRACT | 3 refills | Status: DC
Start: 2021-05-18 — End: 2023-03-21

## 2021-08-25 ENCOUNTER — Ambulatory Visit: Payer: Medicare Other | Admitting: Family Medicine

## 2021-08-31 ENCOUNTER — Encounter: Payer: Self-pay | Admitting: Family Medicine

## 2021-08-31 ENCOUNTER — Ambulatory Visit (INDEPENDENT_AMBULATORY_CARE_PROVIDER_SITE_OTHER): Payer: Medicare Other | Admitting: Family Medicine

## 2021-08-31 VITALS — BP 122/86 | HR 96 | Resp 18 | Ht 71.0 in | Wt 226.0 lb

## 2021-08-31 DIAGNOSIS — Z125 Encounter for screening for malignant neoplasm of prostate: Secondary | ICD-10-CM | POA: Diagnosis not present

## 2021-08-31 DIAGNOSIS — Z77090 Contact with and (suspected) exposure to asbestos: Secondary | ICD-10-CM

## 2021-08-31 DIAGNOSIS — J449 Chronic obstructive pulmonary disease, unspecified: Secondary | ICD-10-CM

## 2021-08-31 DIAGNOSIS — I10 Essential (primary) hypertension: Secondary | ICD-10-CM | POA: Diagnosis not present

## 2021-08-31 NOTE — Assessment & Plan Note (Signed)
Stable on current regimen he has not had any recent flares or exacerbations it does look like Advair might be cheaper for him on his formulary but he would have to use it twice a day.  He says for now he will just stick with the [facility].  He does need to work on smoking cessation ?

## 2021-08-31 NOTE — Assessment & Plan Note (Signed)
Blood pressure is up a little bit today but normally really well controlled. ?

## 2021-08-31 NOTE — Progress Notes (Signed)
? ?Established Patient Office Visit ? ?Subjective:  ?Patient ID: Edward Warren, male    DOB: 1955-06-13  Age: 66 y.o. MRN: [id number] ? ?CC:  ?Chief Complaint  ?Patient presents with  ? Hypertension  ?  Follow up   ? ? ?HPI ?Edward Warren presents for  ? ?Hypertension- Pt denies chest pain, SOB, dizziness, or heart palpitations.  Taking meds as directed w/o problems.  Denies medication side effects.  He says this last month has been particularly stressful he has been in and out of the hospital visiting his brother who just passed away. ? ?F/U COPD -he reports he is actually doing really well on his Memory Dance he has not had any recent flares or exacerbations with his breathing.  He does continue to smoke.  His mom also has COPD incident and is on oxygen.  Wonders if Trelegy might be less expensive on his insurance plan. ? ?Gout-he is actually doing really well.  He says he is only taking the allopurinol once a day instead of twice a day and has been for almost a year and has not had any flares. ? ? ?Past Medical History:  ?Diagnosis Date  ? 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 per patient dx by ct scan 2012  ? COPD (chronic obstructive pulmonary disease) ([place])   ? PFTS: 7-10: TEV1 ration of 62, FEV1 63% and FVC 80% (mod, class II)  ? Emphysema of lung ([place])   ? History of shingles   ? Hypertension   ? Personal history of colonic adenomas 07/05/2012  ? ? ?Past Surgical History:  ?Procedure Laterality Date  ? COLONOSCOPY  multiple  ? ? ?Family History  ?Problem Relation Age of Onset  ? COPD Mother   ? Heart attack Father   ? Stroke Maternal Grandfather   ? Breast cancer Maternal Aunt   ? Cancer Maternal Uncle   ?     throat  ? Esophageal cancer Maternal Uncle   ? Colon cancer Neg Hx   ? Colon polyps Neg Hx   ? Rectal cancer Neg Hx   ? Stomach cancer Neg Hx   ? ? ?Social History  ? ?Socioeconomic History  ? Marital status: Married  ?  Spouse name: Not on file  ? Number of children: 1  ? Years of  education: Not on file  ? Highest education level: Not on file  ?Occupational History  ? Occupation: disabled  ?  Comment: [employer]  ?Tobacco Use  ? Smoking status: Every Day  ?  Packs/day: 0.50  ?  Years: 42.00  ?  Pack years: 21.00  ?  Types: Cigarettes  ? Smokeless tobacco: Never  ? Tobacco comments:  ?  Smokes 1/2 pack a day   ?Vaping Use  ? Vaping Use: Never used  ?Substance and Sexual Activity  ? Alcohol use: Yes  ?  Alcohol/week: 3.0 standard drinks  ?  Types: 3 Standard drinks or equivalent per week  ? Drug use: No  ? Sexual activity: Not on file  ?Other Topics Concern  ? Not on file  ?Social History Narrative  ? No regular exercise.   ? ?Social Determinants of Health  ? ?Financial Resource Strain: Not on file  ?Food Insecurity: Not on file  ?Transportation Needs: Not on file  ?Physical Activity: Not on file  ?Stress: Not on file  ?Social Connections: Not on file  ?Intimate Partner Violence: Not on file  ? ? ?  Outpatient Medications Prior to Visit  ?Medication Sig Dispense Refill  ? albuterol (PROAIR HFA) 108 (90 Base) MCG/ACT inhaler INHALE 2 PUFFS INTO THE LUNGS EVERY 6 (SIX) HOURS AS NEEDED. 18 g 3  ? allopurinol (ZYLOPRIM) 300 MG tablet TAKE 1 TABLET (300 MG TOTAL) BY MOUTH 2 (TWO) TIMES DAILY REGARDLESS OF SYMPTOMS 180 tablet 1  ? AMBULATORY NON FORMULARY MEDICATION Medication Name: Nebulizer with supplies including tubing etc.  Diagnosis COPD J44.9 1 vial 0  ? BREO ELLIPTA 100-25 MCG/INH AEPB INHALE 1 PUFF BY MOUTH DAILY AS DIRECTED 60 each 11  ? colchicine 0.6 MG tablet TAKE 1 TABLET BY MOUTH EVERY DAY 90 tablet 1  ? indomethacin (INDOCIN) 50 MG capsule Take 1 capsule (50 mg total) by mouth 2 (two) times daily as needed for moderate pain (gout flare). 60 capsule 0  ? Olmesartan-amLODIPine-HCTZ 40-10-25 MG TABS TAKE 1 TABLET BY MOUTH EVERY DAY 90 tablet 1  ? omeprazole (PRILOSEC) 40 MG capsule TAKE 1 CAPSULE (40 MG TOTAL) BY MOUTH DAILY. TAKE 30 MIN BEFORE FIRST MEAL OF DAY. 90 capsule 3  ? ?No  facility-administered medications prior to visit.  ? ? ?Allergies  ?Allergen Reactions  ? Prednisone Other (See Comments)  ?  Nightmares and suicidal thoughts  ? ? ?ROS ?Review of Systems ? ?  ?Objective:  ?  ?Physical Exam ?Constitutional:   ?   Appearance: Normal appearance. He is well-developed.  ?HENT:  ?   Head: Normocephalic and atraumatic.  ?Cardiovascular:  ?   Rate and Rhythm: Normal rate and regular rhythm.  ?   Heart sounds: Normal heart sounds.  ?Pulmonary:  ?   Effort: Pulmonary effort is normal.  ?   Breath sounds: Normal breath sounds.  ?Skin: ?   General: Skin is warm and dry.  ?Neurological:  ?   Mental Status: He is alert and oriented to person, place, and time. Mental status is at baseline.  ?Psychiatric:     ?   Behavior: Behavior normal.  ? ? ?BP 122/86 (BP Location: Right Arm)   Pulse 96   Resp 18   Ht _0  (1.803 m)   Wt 226 lb (102.5 kg)   SpO2 98%   BMI 31.52 kg/m?  ?Wt Readings from Last 3 Encounters:  ?08/31/21 226 lb (102.5 kg)  ?08/25/20 236 lb (107 kg)  ?02/25/20 224 lb 11.2 oz (101.9 kg)  ? ? ? ?There are no preventive care reminders to display for this patient. ? ?There are no preventive care reminders to display for this patient. ? ?Lab Results  ?Component Value Date  ? TSH 1.000 07/01/2010  ? ?Lab Results  ?Component Value Date  ? WBC 4.1 01/12/2016  ? HGB 15.3 01/12/2016  ? HCT 44.9 01/12/2016  ? MCV 101.4 (H) 01/12/2016  ? PLT 199 01/12/2016  ? ?Lab Results  ?Component Value Date  ? NA 137 02/24/2021  ? K 4.1 02/24/2021  ? CO2 22 02/24/2021  ? GLUCOSE 105 (H) 02/24/2021  ? BUN 15 02/24/2021  ? CREATININE 1.09 02/24/2021  ? BILITOT 0.6 02/25/2020  ? ALKPHOS 62 01/12/2016  ? AST 20 02/25/2020  ? ALT 21 02/25/2020  ? PROT 7.2 02/25/2020  ? ALBUMIN 4.1 01/12/2016  ? CALCIUM 9.5 02/24/2021  ? EGFR 74 02/24/2021  ? ?Lab Results  ?Component Value Date  ? CHOL 159 08/31/2020  ? ?Lab Results  ?Component Value Date  ? HDL 71 08/31/2020  ? ?Lab Results  ?Component Value Date  ?  [place]  67 08/31/2020  ? ?Lab Results  ?Component Value Date  ? TRIG 127 08/31/2020  ? ?Lab Results  ?Component Value Date  ? CHOLHDL 2.2 08/31/2020  ? ?Lab Results  ?Component Value Date  ? HGBA1C 5.0 02/06/2017  ? ? ?  ?Assessment & Plan:  ? ?Problem List Items Addressed This Visit   ? ?  ? Cardiovascular and Mediastinum  ? HYPERTENSION, BENIGN - Primary  ?  Blood pressure is up a little bit today but normally really well controlled. ?  ?  ? Relevant Orders  ? PSA  ? Lipid panel  ? COMPLETE METABOLIC PANEL WITH GFR  ? CBC  ?  ? Respiratory  ? COPD (chronic obstructive pulmonary disease) ([place])  ?  Stable on current regimen he has not had any recent flares or exacerbations it does look like Advair might be cheaper for him on his formulary but he would have to use it twice a day.  He says for now he will just stick with the [facility].  He does need to work on smoking cessation ?  ?  ? Relevant Orders  ? PSA  ? Lipid panel  ? COMPLETE METABOLIC PANEL WITH GFR  ? CBC  ?  ? Other  ? H/O asbestos exposure  ? ?Other Visit Diagnoses   ? ? Screening for prostate cancer      ? Relevant Orders  ? PSA  ? ?  ? ? ?No orders of the defined types were placed in this encounter. ? ? ?Follow-up: Return in about 6 months (around 03/02/2022) for Hypertension and Gout and COPD.  ? ? ?Beatrice Lecher, MD ?

## 2021-09-01 LAB — COMPLETE METABOLIC PANEL WITH GFR
AG Ratio: 1.5 (calc) (ref 1.0–2.5)
ALT: 22 U/L (ref 9–46)
AST: 24 U/L (ref 10–35)
Albumin: 4.4 g/dL (ref 3.6–5.1)
Alkaline phosphatase (APISO): 88 U/L (ref 35–144)
BUN: 14 mg/dL (ref 7–25)
CO2: 21 mmol/L (ref 20–32)
Calcium: 9.6 mg/dL (ref 8.6–10.3)
Chloride: 104 mmol/L (ref 98–110)
Creat: 1.16 mg/dL (ref 0.70–1.35)
Globulin: 2.9 g/dL (calc) (ref 1.9–3.7)
Glucose, Bld: 105 mg/dL — ABNORMAL HIGH (ref 65–99)
Potassium: 4 mmol/L (ref 3.5–5.3)
Sodium: 138 mmol/L (ref 135–146)
Total Bilirubin: 0.7 mg/dL (ref 0.2–1.2)
Total Protein: 7.3 g/dL (ref 6.1–8.1)
eGFR: 69 mL/min/{1.73_m2} (ref >=60)

## 2021-09-01 LAB — LIPID PANEL
Cholesterol: 176 mg/dL (ref <200)
HDL: 99 mg/dL (ref >=40)
LDL Cholesterol (Calc): 61 mg/dL (calc)
Non-HDL Cholesterol (Calc): 77 mg/dL (calc) (ref <130)
Total CHOL/HDL Ratio: 1.8 (calc) (ref <5.0)
Triglycerides: 78 mg/dL (ref <150)

## 2021-09-01 LAB — PSA: PSA: 0.88 ng/mL (ref <=4.00)

## 2021-09-01 LAB — CBC
HCT: 45.4 % (ref 38.5–50.0)
Hemoglobin: 15.5 g/dL (ref 13.2–17.1)
MCH: 35.1 pg — ABNORMAL HIGH (ref 27.0–33.0)
MCHC: 34.1 g/dL (ref 32.0–36.0)
MCV: 102.9 fL — ABNORMAL HIGH (ref 80.0–100.0)
MPV: 11.9 fL (ref 7.5–12.5)
Platelets: 198 10*3/uL (ref 140–400)
RBC: 4.41 10*6/uL (ref 4.20–5.80)
RDW: 12.5 % (ref 11.0–15.0)
WBC: 4.5 10*3/uL (ref 3.8–10.8)

## 2021-09-02 ENCOUNTER — Telehealth: Payer: Self-pay | Admitting: *Deleted

## 2021-09-02 MED ORDER — TRELEGY ELLIPTA 100-62.5-25 MCG/ACT IN AEPB: 1.0000 | INHALATION_SPRAY | Freq: Every day | RESPIRATORY_TRACT | 3 refills | Status: DC

## 2021-09-02 NOTE — Telephone Encounter (Signed)
Pt lvm asking that Dr. Madilyn Fireman send prescription for Trelegy in for him. This was discussed at last OV. ? ?F/U COPD -he reports he is actually doing really well on his Memory Dance he has not had any recent flares or exacerbations with his breathing.  He does continue to smoke.  His mom also has COPD incident and is on oxygen.  Wonders if Trelegy might be less expensive on his insurance plan. ?  ?

## 2021-09-02 NOTE — Telephone Encounter (Signed)
OK , new rx sent for Trelegy  ?

## 2021-09-07 NOTE — Telephone Encounter (Signed)
Patient advised.

## 2021-09-23 ENCOUNTER — Other Ambulatory Visit: Payer: Self-pay | Admitting: Family Medicine

## 2021-09-23 DIAGNOSIS — I10 Essential (primary) hypertension: Secondary | ICD-10-CM

## 2021-09-28 ENCOUNTER — Ambulatory Visit (INDEPENDENT_AMBULATORY_CARE_PROVIDER_SITE_OTHER): Payer: Medicare Other | Admitting: Family Medicine

## 2021-09-28 DIAGNOSIS — Z0000 Encounter for general adult medical examination without abnormal findings: Secondary | ICD-10-CM | POA: Diagnosis not present

## 2021-09-28 NOTE — Patient Instructions (Addendum)
?MEDICARE ANNUAL WELLNESS VISIT ?Health Maintenance Summary and Written Plan of Care ? ?[name] , ? ?Thank you for allowing me to perform your Medicare Annual Wellness Visit and for your ongoing commitment to your health.  ? ?Health Maintenance & Immunization History ?Health Maintenance  ?Topic Date Due  ? COVID-19 Vaccine (4 - Booster for [place] series) 10/14/2021 (Originally 05/17/2020)  ? Zoster Vaccines- Shingrix (1 of 2) 11/30/2021 (Originally 12/01/2003)  ? TETANUS/TDAP  09/01/2022 (Originally 04/18/2021)  ? INFLUENZA VACCINE  12/20/2021  ? COLONOSCOPY (Pts 45-43yr Insurance coverage will need to be confirmed)  03/17/2024  ? Pneumonia Vaccine 68 Years old  Completed  ? Hepatitis C Screening  Completed  ? HPV VACCINES  Aged Out  ? ?Immunization History  ?Administered Date(s) Administered  ? Fluad Quad(high Dose 65+) 04/09/2019, 02/25/2020, 02/24/2021  ? Influenza,inj,Quad PF,6+ Mos 02/20/2014, 03/08/2015, 01/12/2016, 03/28/2018  ? PFIZER(Purple Top)SARS-COV-2 Vaccination 07/15/2019, 08/07/2019, 03/22/2020  ? PNEUMOCOCCAL CONJUGATE-20 02/24/2021  ? Pneumococcal Polysaccharide-23 02/20/2014  ? Tdap 04/19/2011  ? ? ?These are the patient goals that we discussed: ? Goals Addressed   ? ?  ?  ?  ?  ?  ? This Visit's Progress  ?   Patient Stated (pt-stated)     ?   09/28/2021 ?AWV Goal: Tobacco Cessation ? ?Smoking cessation instruction/counseling given:  counseled patient on the dangers of tobacco use, advised patient to stop smoking, and reviewed strategies to maximize success ? ?Patient will verbalize understanding of the health risks associated with smoking/tobacco use ?Lung cancer or lung disease, such as COPD ?Heart disease. ?Stroke. ?Heart attack ?Infertility ?Osteoporosis and bone fractures. ?Patient will create a plan to quit smoking/using tobacco ?Pick a date to quit.  ?Write down the reasons why you are quitting and put it where you will see it often. ?Identify the people, places, things, and  activities that make you want to smoke (triggers) and avoid them. Make sure to take these actions: ?Throw away all cigarettes at home, at work, and in your car. ?Throw away smoking accessories, such as aScientist, research (medical) ?Clean your car and make sure to empty the ashtray. ?Clean your home, including curtains and carpets. ?Tell your family, friends, and coworkers that you are quitting. Support from your loved ones can make quitting easier. ?Talk with your health care provider about your options for quitting smoking. ?Find out what treatment options are covered by your health insurance. ?Patient will be able to demonstrate knowledge of tobacco cessation strategies that may maximize success ?Quitting ?cold [place] is more successful than gradually quitting. ?Attending in-person counseling to help you build problem-solving skills.  ?Finding resources and support systems that can help you to quit smoking and remain smoke-free after you quit. These resources are most helpful when you use them often. They can include: ?Online chats with a cSocial worker ?Telephone quitlines. ?PCareers information officer ?Support groups or group counseling. ?Text messaging programs. ?Mobile phone applications. ?Taking medicines to help you quit smoking: ?Nicotine patches, gum, or lozenges. ?Nicotine inhalers or sprays. ?Non-nicotine medicine that is taken by mouth. ?Patient will note get discouraged if the process is difficult ?Over the next year, patient will stop smoking or using other forms of tobacco ? ?Smoking cessation instruction/counseling given:  counseled patient on the dangers of tobacco use, advised patient to stop smoking, and reviewed strategies to maximize success   ?  ? ?  ?  ? ?This is a list of Health Maintenance Items that are overdue or due now: ?Td vaccine ?Shingrix vaccine ? ?  Orders/Referrals Placed Today: ?No orders of the defined types were placed in this encounter. ? ?(Contact our referral department at  [phone] if you have not spoken with someone about your referral appointment within the next 5 days)  ? ? ?Follow-up Plan ?Follow-up with Hali Marry, MD as planned ?Schedule your Shingrix and tetanus vaccines at your pharmacy. ?Discuss lung cancer screening with PCP. ?Medicare wellness visit in one year. ?AVS printed and mailed to the patient. ? ? ? ?  ?Health Maintenance, Male ?Adopting a healthy lifestyle and getting preventive care are important in promoting health and wellness. Ask your health care provider about: ?The right schedule for you to have regular tests and exams. ?Things you can do on your own to prevent diseases and keep yourself healthy. ?What should I know about diet, weight, and exercise? ?Eat a healthy diet ? ?Eat a diet that includes plenty of vegetables, fruits, low-fat dairy products, and lean protein. ?Do not eat a lot of foods that are high in solid fats, added sugars, or sodium. ?Maintain a healthy weight ?Body mass index (BMI) is a measurement that can be used to identify possible weight problems. It estimates body fat based on height and weight. Your health care provider can help determine your BMI and help you achieve or maintain a healthy weight. ?Get regular exercise ?Get regular exercise. This is one of the most important things you can do for your health. Most adults should: ?Exercise for at least 150 minutes each week. The exercise should increase your heart rate and make you sweat (moderate-intensity exercise). ?Do strengthening exercises at least twice a week. This is in addition to the moderate-intensity exercise. ?Spend less time sitting. Even light physical activity can be beneficial. ?Watch cholesterol and blood lipids ?Have your blood tested for lipids and cholesterol at 68 years of age, then have this test every 5 years. ?You may need to have your cholesterol levels checked more often if: ?Your lipid or cholesterol levels are high. ?You are older than 68 years  of age. ?You are at high risk for heart disease. ?What should I know about cancer screening? ?Many types of cancers can be detected early and may often be prevented. Depending on your health history and family history, you may need to have cancer screening at various ages. This may include screening for: ?Colorectal cancer. ?Prostate cancer. ?Skin cancer. ?Lung cancer. ?What should I know about heart disease, diabetes, and high blood pressure? ?Blood pressure and heart disease ?High blood pressure causes heart disease and increases the risk of stroke. This is more likely to develop in people who have high blood pressure readings or are overweight. ?Talk with your health care provider about your target blood pressure readings. ?Have your blood pressure checked: ?Every 3-5 years if you are 25-3 years of age. ?Every year if you are 44 years old or older. ?If you are between the ages of 30 and 36 and are a current or former smoker, ask your health care provider if you should have a one-time screening for abdominal aortic aneurysm (AAA). ?Diabetes ?Have regular diabetes screenings. This checks your fasting blood sugar level. Have the screening done: ?Once every three years after age 78 if you are at a normal weight and have a low risk for diabetes. ?More often and at a younger age if you are overweight or have a high risk for diabetes. ?What should I know about preventing infection? ?Hepatitis B ?If you have a higher risk for hepatitis B, you  should be screened for this virus. Talk with your health care provider to find out if you are at risk for hepatitis B infection. ?Hepatitis C ?Blood testing is recommended for: ?Everyone born from [birth year] through 1967. ?Anyone with known risk factors for hepatitis C. ?Sexually transmitted infections (STIs) ?You should be screened each year for STIs, including gonorrhea and chlamydia, if: ?You are sexually active and are younger than 68 years of age. ?You are older than 68 years of age  and your health care provider tells you that you are at risk for this type of infection. ?Your sexual activity has changed since you were last screened, and you are at increased risk for chlamydia or gonorrhea. Ask y

## 2021-09-28 NOTE — Progress Notes (Signed)
? ? ?MEDICARE ANNUAL WELLNESS VISIT ? ?09/28/2021 ? ?Telephone Visit Disclaimer ?This Medicare AWV was conducted by telephone due to national recommendations for restrictions regarding the COVID-19 Pandemic (e.g. social distancing).  I verified, using two identifiers, that I am speaking with Edward Warren or their authorized healthcare agent. I discussed the limitations, risks, security, and privacy concerns of performing an evaluation and management service by telephone and the potential availability of an in-person appointment in the future. The patient expressed understanding and agreed to proceed.  ?Location of Patient: Home ?Location of Provider (nurse):  In the office. ? ?Subjective:  ? ? ?Edward Warren is a 66 y.o. male patient of Metheney, Rene Kocher, MD who had a Medicare Annual Wellness Visit today via telephone. Edward Warren is Retired and lives with their spouse. he has 1 child. he reports that he is socially active and does interact with friends/family regularly. he is minimally physically active and enjoys gardening. ? ?Patient Care Team: ?Hali Marry, MD as PCP - General ?Darius Bump, [facility] as Pharmacist (Pharmacist) ? ? ?  09/28/2021  ? 10:05 AM 02/09/2016  ?  9:00 AM 06/17/2015  ? 11:05 AM 03/06/2014  ? 10:34 AM  ?Advanced Directives  ?Does Patient Have a Medical Advance Directive? Yes Yes Yes No  ?Type of Advance Directive Living will  [place];Living will   ?Does patient want to make changes to medical advance directive? No - Patient declined     ?Copy of [place] in Chart?  No - copy requested    ?Would patient like information on creating a medical advance directive?    Yes - Educational materials given  ? ? ?Hospital Utilization Over the Past 12 Months: ?# of hospitalizations or ER visits: 0 ?# of surgeries: 0 ? ?Review of Systems    ?Patient reports that his overall health is unchanged compared to last year. ? ?History obtained from chart  review and the patient ? ?Patient Reported Readings (BP, Pulse, CBG, Weight, etc) ?none ? ?Pain Assessment ?Pain : No/denies pain ? ?  ? ?Current Medications & Allergies (verified) ?Allergies as of 09/28/2021   ? ?   Reactions  ? Prednisone Other (See Comments)  ? Nightmares and suicidal thoughts  ? ?  ? ?  ?Medication List  ?  ? ?  ? Accurate as of Sep 28, 2021 10:18 AM. If you have any questions, ask your nurse or doctor.  ?  ?  ? ?  ? ?albuterol 108 (90 Base) MCG/ACT inhaler ?Commonly known as: ProAir HFA ?INHALE 2 PUFFS INTO THE LUNGS EVERY 6 (SIX) HOURS AS NEEDED. ?  ?allopurinol 300 MG tablet ?Commonly known as: ZYLOPRIM ?TAKE 1 TABLET (300 MG TOTAL) BY MOUTH 2 (TWO) TIMES DAILY REGARDLESS OF SYMPTOMS ?  ?AMBULATORY NON FORMULARY MEDICATION ?Medication Name: Nebulizer with supplies including tubing etc.  Diagnosis COPD J44.9 ?  ?Breo Ellipta 100-25 MCG/ACT Aepb ?Generic drug: fluticasone furoate-vilanterol ?INHALE 1 PUFF BY MOUTH DAILY AS DIRECTED ?  ?colchicine 0.6 MG tablet ?TAKE 1 TABLET BY MOUTH EVERY DAY ?  ?indomethacin 50 MG capsule ?Commonly known as: INDOCIN ?Take 1 capsule (50 mg total) by mouth 2 (two) times daily as needed for moderate pain (gout flare). ?  ?Olmesartan-amLODIPine-HCTZ 40-10-25 MG Tabs ?TAKE 1 TABLET BY MOUTH EVERY DAY ?  ?Trelegy Ellipta 100-62.5-25 MCG/ACT Aepb ?Generic drug: Fluticasone-Umeclidin-Vilant ?Inhale 1 puff into the lungs daily. ?  ? ?  ? ? ?History (reviewed): ?Past Medical History:  ?  Diagnosis Date  ? 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 per patient dx by ct scan 2012  ? COPD (chronic obstructive pulmonary disease) ([place])   ? PFTS: 7-10: TEV1 ration of 62, FEV1 63% and FVC 80% (mod, class II)  ? Emphysema of lung ([place])   ? History of shingles   ? Hypertension   ? Personal history of colonic adenomas 07/05/2012  ? ?Past Surgical History:  ?Procedure Laterality Date  ? COLONOSCOPY  multiple  ? ?Family History  ?Problem Relation Age of Onset  ? COPD Mother   ?  Heart attack Father   ? Stroke Maternal Grandfather   ? Breast cancer Maternal Aunt   ? Cancer Maternal Uncle   ?     throat  ? Esophageal cancer Maternal Uncle   ? Colon cancer Neg Hx   ? Colon polyps Neg Hx   ? Rectal cancer Neg Hx   ? Stomach cancer Neg Hx   ? ?Social History  ? ?Socioeconomic History  ? Marital status: Married  ?  Spouse name: Edward Warren  ? Number of children: 1  ? Years of education: 12th grade  ? Highest education level: 12th grade  ?Occupational History  ? Occupation: disabled  ?  Comment: [employer]  ?Tobacco Use  ? Smoking status: Every Day  ?  Packs/day: 0.50  ?  Years: 42.00  ?  Pack years: 21.00  ?  Types: Cigarettes  ? Smokeless tobacco: Never  ? Tobacco comments:  ?  Smokes 1/2 pack a day   ?Vaping Use  ? Vaping Use: Never used  ?Substance and Sexual Activity  ? Alcohol use: Yes  ?  Alcohol/week: 3.0 standard drinks  ?  Types: 3 Standard drinks or equivalent per week  ? Drug use: No  ? Sexual activity: Not on file  ?Other Topics Concern  ? Not on file  ?Social History Narrative  ? Lives with his wife. He has one child. He enjoys working in his yard.  ? ?Social Determinants of Health  ? ?Financial Resource Strain: Low Risk   ? Difficulty of Paying Living Expenses: Not hard at all  ?Food Insecurity: No Food Insecurity  ? Worried About Charity fundraiser in the Last Year: Never true  ? Ran Out of Food in the Last Year: Never true  ?Transportation Needs: No Transportation Needs  ? Lack of Transportation (Medical): No  ? Lack of Transportation (Non-Medical): No  ?Physical Activity: Inactive  ? Days of Exercise per Week: 0 days  ? Minutes of Exercise per Session: 0 min  ?Stress: No Stress Concern Present  ? Feeling of Stress : Not at all  ?Social Connections: Moderately Integrated  ? Frequency of Communication with Friends and Family: More than three times a week  ? Frequency of Social Gatherings with Friends and Family: Never  ? Attends Religious Services: 1 to 4 times per year  ? Active  Member of Clubs or Organizations: No  ? Attends Archivist Meetings: Never  ? Marital Status: Married  ? ? ?Activities of Daily Living ? ?  09/28/2021  ? 10:09 AM  ?In your present state of health, do you have any difficulty performing the following activities:  ?Hearing? 0  ?Vision? 0  ?Difficulty concentrating or making decisions? 0  ?Walking or climbing stairs? 0  ?Dressing or bathing? 0  ?Doing errands, shopping? 0  ?Preparing Food and eating ? N  ?Using the Toilet?  N  ?In the past six months, have you accidently leaked urine? N  ?Do you have problems with loss of bowel control? N  ?Managing your Medications? N  ?Managing your Finances? N  ?Housekeeping or managing your Housekeeping? N  ? ? ?Patient Education/ Literacy ?How often do you need to have someone help you when you read instructions, pamphlets, or other written materials from your doctor or pharmacy?: 1 - Never ?What is the last grade level you completed in school?: 12th grade ? ?Exercise ?Current Exercise Habits: The patient does not participate in regular exercise at present, Exercise limited by: None identified ? ?Diet ?Patient reports consuming 2 meals a day and 3 snack(s) a day ?Patient reports that his primary diet is: Regular ?Patient reports that she does have regular access to food.  ? ?Depression Screen ? ?  09/28/2021  ? 10:07 AM 08/31/2021  ? 10:57 AM 08/25/2020  ?  9:54 AM 02/25/2020  ? 11:31 AM 04/30/2019  ? 10:38 AM 10/11/2018  ?  3:48 PM 03/28/2018  ? 11:20 AM  ?PHQ 2/9 Scores  ?PHQ - 2 Score 0 0 0 0 0 0 0  ?  ? ?Fall Risk ? ?  09/28/2021  ? 10:06 AM 08/31/2021  ? 10:57 AM 08/25/2020  ?  9:54 AM 02/25/2020  ? 11:30 AM 04/30/2019  ? 10:38 AM  ?Fall Risk   ?Falls in the past year? 0 0 0 0 0  ?Number falls in past yr: 0 0   0  ?Injury with Fall? 0 0   0  ?Risk for fall due to : No Fall Risks No Fall Risks No Fall Risks    ?Follow up Falls evaluation completed Falls prevention discussed;Falls evaluation completed Falls evaluation completed     ? ?  ?Objective:  ?Edward Warren seemed alert and oriented and he participated appropriately during our telephone visit. ? ?Blood Pressure Weight BMI  ?BP Readings from Last 3 Encounters:  ?08/31/21 122/

## 2021-10-18 ENCOUNTER — Other Ambulatory Visit: Payer: Self-pay | Admitting: Family Medicine

## 2021-10-18 DIAGNOSIS — M1A09X0 Idiopathic chronic gout, multiple sites, without tophus (tophi): Secondary | ICD-10-CM

## 2022-03-02 ENCOUNTER — Ambulatory Visit (INDEPENDENT_AMBULATORY_CARE_PROVIDER_SITE_OTHER): Payer: Medicare Other | Admitting: Family Medicine

## 2022-03-02 ENCOUNTER — Encounter: Payer: Self-pay | Admitting: Family Medicine

## 2022-03-02 VITALS — BP 119/76 | HR 76 | Ht 71.0 in | Wt 225.0 lb

## 2022-03-02 DIAGNOSIS — K625 Hemorrhage of anus and rectum: Secondary | ICD-10-CM | POA: Diagnosis not present

## 2022-03-02 DIAGNOSIS — M1A09X0 Idiopathic chronic gout, multiple sites, without tophus (tophi): Secondary | ICD-10-CM

## 2022-03-02 DIAGNOSIS — I10 Essential (primary) hypertension: Secondary | ICD-10-CM | POA: Diagnosis not present

## 2022-03-02 DIAGNOSIS — J449 Chronic obstructive pulmonary disease, unspecified: Secondary | ICD-10-CM | POA: Diagnosis not present

## 2022-03-02 DIAGNOSIS — Z23 Encounter for immunization: Secondary | ICD-10-CM | POA: Diagnosis not present

## 2022-03-02 NOTE — Assessment & Plan Note (Signed)
Well controlled. Continue current regimen. Follow up in  6 mo  

## 2022-03-02 NOTE — Assessment & Plan Note (Signed)
Able on Trelegy.  Does not need refills today no recent flares or exacerbations.

## 2022-03-02 NOTE — Progress Notes (Signed)
Established Patient Office Visit  Subjective   Patient ID: Edward Warren, male    DOB: 03/06/1956  Age: 66 y.o. MRN: [id number]  Chief Complaint  Patient presents with   Follow-up   Hypertension   Gout    HPI  Hypertension- Pt denies chest pain, SOB, dizziness, or heart palpitations.  Taking meds as directed w/o problems.  Denies medication side effects.    Follow-up COPD-he denies any recent flares or exacerbations he is doing well with his Trelegy and insurance seems to be covering it.  F/U Gout - no recent exacerbation.  Blood in stool, BRBPR sporadic.  No rectal pain or discomfort.  Hx of hemorrheoids.  He says it just noticed it for couple days and then it went away.  He is up-to-date on his screening colonoscopy.  No other abdominal pain or red flag symptoms.      ROS    Objective:     BP 119/76   Pulse 76   Ht '5\' 11"'$  (1.803 m)   Wt 225 lb (102.1 kg)   SpO2 98%   BMI 31.38 kg/m    Physical Exam Constitutional:      Appearance: He is well-developed.  HENT:     Head: Normocephalic and atraumatic.  Cardiovascular:     Rate and Rhythm: Normal rate and regular rhythm.     Heart sounds: Normal heart sounds.  Pulmonary:     Effort: Pulmonary effort is normal.     Breath sounds: Normal breath sounds.  Skin:    General: Skin is warm and dry.  Neurological:     Mental Status: He is alert and oriented to person, place, and time.  Psychiatric:        Behavior: Behavior normal.      No results found for any visits on 03/02/22.    The 10-year ASCVD risk score (Arnett DK, et al., 2019) is: 12.9%    Assessment & Plan:   Problem List Items Addressed This Visit       Cardiovascular and Mediastinum   HYPERTENSION, BENIGN    Well controlled. Continue current regimen. Follow up in  6 mo       Relevant Orders   Uric acid   BASIC METABOLIC PANEL WITH GFR     Respiratory   COPD (chronic obstructive pulmonary disease) (HCC)    Able on Trelegy.   Does not need refills today no recent flares or exacerbations.        Other   Gout and pseudogout    No recent flares.  Stable on current regimen.  He is on HCTZ which can certainly cause flares but right now he has been quite stable and blood pressures have been much better controlled.      Other Visit Diagnoses     Flu vaccine need    -  Primary   Relevant Orders   Flu Vaccine QUAD High Dose(Fluad) (Completed)   Rectal bleeding           Rectal bleeding-it sounds like he has a history of hemorrhoids it was bright red without any discomfort.  He had been straining a little bit more bowel movements around that time.  We did discuss that if it recurs to please let me know but he is up-to-date on his colon cancer screening encouraged him to increase his fiber and water in his diet for softer stools  Return in about 6 months (around 09/01/2022) for Hypertension.    Beatrice Lecher, MD

## 2022-03-02 NOTE — Assessment & Plan Note (Signed)
No recent flares.  Stable on current regimen.  He is on HCTZ which can certainly cause flares but right now he has been quite stable and blood pressures have been much better controlled.

## 2022-03-03 LAB — BASIC METABOLIC PANEL WITH GFR
BUN: 19 mg/dL (ref 7–25)
CO2: 21 mmol/L (ref 20–32)
Calcium: 9 mg/dL (ref 8.6–10.3)
Chloride: 108 mmol/L (ref 98–110)
Creat: 1.05 mg/dL (ref 0.70–1.35)
Glucose, Bld: 103 mg/dL — ABNORMAL HIGH (ref 65–99)
Potassium: 4.1 mmol/L (ref 3.5–5.3)
Sodium: 138 mmol/L (ref 135–146)
eGFR: 77 mL/min/{1.73_m2} (ref >=60)

## 2022-03-03 LAB — URIC ACID: Uric Acid, Serum: 4.5 mg/dL (ref 4.0–8.0)

## 2022-03-03 NOTE — Progress Notes (Signed)
Your lab work is within acceptable range and there are no concerning findings.   ?

## 2022-05-03 ENCOUNTER — Other Ambulatory Visit: Payer: Self-pay | Admitting: Family Medicine

## 2022-05-03 DIAGNOSIS — M1A09X0 Idiopathic chronic gout, multiple sites, without tophus (tophi): Secondary | ICD-10-CM

## 2022-05-03 DIAGNOSIS — M10071 Idiopathic gout, right ankle and foot: Secondary | ICD-10-CM

## 2022-05-03 DIAGNOSIS — M1A0690 Idiopathic chronic gout, unspecified knee, without tophus (tophi): Secondary | ICD-10-CM

## 2022-07-15 ENCOUNTER — Other Ambulatory Visit: Payer: Self-pay | Admitting: Family Medicine

## 2022-08-31 ENCOUNTER — Ambulatory Visit: Payer: Medicare Other | Admitting: Family Medicine

## 2022-09-19 ENCOUNTER — Ambulatory Visit (INDEPENDENT_AMBULATORY_CARE_PROVIDER_SITE_OTHER): Payer: Medicare Other | Admitting: Family Medicine

## 2022-09-19 ENCOUNTER — Encounter: Payer: Self-pay | Admitting: Family Medicine

## 2022-09-19 VITALS — BP 128/77 | HR 98 | Ht 71.0 in | Wt 222.0 lb

## 2022-09-19 DIAGNOSIS — M1A0690 Idiopathic chronic gout, unspecified knee, without tophus (tophi): Secondary | ICD-10-CM

## 2022-09-19 DIAGNOSIS — I10 Essential (primary) hypertension: Secondary | ICD-10-CM

## 2022-09-19 DIAGNOSIS — M1A09X0 Idiopathic chronic gout, multiple sites, without tophus (tophi): Secondary | ICD-10-CM

## 2022-09-19 DIAGNOSIS — M10071 Idiopathic gout, right ankle and foot: Secondary | ICD-10-CM | POA: Diagnosis not present

## 2022-09-19 DIAGNOSIS — J449 Chronic obstructive pulmonary disease, unspecified: Secondary | ICD-10-CM | POA: Diagnosis not present

## 2022-09-19 DIAGNOSIS — F17200 Nicotine dependence, unspecified, uncomplicated: Secondary | ICD-10-CM | POA: Diagnosis not present

## 2022-09-19 MED ORDER — OLMESARTAN-AMLODIPINE-HCTZ 40-10-25 MG PO TABS: 1.0000 | ORAL_TABLET | Freq: Every day | ORAL | 3 refills | Status: DC

## 2022-09-19 MED ORDER — ALLOPURINOL 300 MG PO TABS: 300.0000 mg | ORAL_TABLET | Freq: Two times a day (BID) | ORAL | 3 refills | Status: DC

## 2022-09-19 MED ORDER — TRELEGY ELLIPTA 100-62.5-25 MCG/ACT IN AEPB
INHALATION_SPRAY | RESPIRATORY_TRACT | 11 refills | Status: DC
Start: 2022-09-19 — End: 2023-01-18

## 2022-09-19 NOTE — Assessment & Plan Note (Signed)
Well controlled. Continue current regimen. Follow up in  6 mo  

## 2022-09-19 NOTE — Assessment & Plan Note (Signed)
Stable.  Doing well on Trelegy.  Follow-up in 6 months.  Gets yearly PFTs with Dr. Valorie Roosevelt in [place].

## 2022-09-19 NOTE — Progress Notes (Signed)
Established Patient Office Visit  Subjective   Patient ID: Edward Warren, male    DOB: January 20, 1956  Age: 67 y.o. MRN: [id number]  Chief Complaint  Patient presents with   COPD   Hypertension    HPI Hypertension- Pt denies chest pain, SOB, dizziness, or heart palpitations.  Taking meds as directed w/o problems.  Denies medication side effects.    F/U Gout -no recent gout flares doing very well.  Currently on allopurinol.  F/U COPD -he really has like the Trelegy and feels like it works much better than the Spiriva.  Has not had any recent flares or exacerbations.  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     ROS    Objective:     BP 128/77   Pulse 98   Ht 5\' 11"  (1.803 m)   Wt 222 lb (100.7 kg)   SpO2 98%   BMI 30.96 kg/m    Physical Exam Constitutional:      Appearance: He is well-developed.  HENT:     Head: Normocephalic and atraumatic.  Cardiovascular:     Rate and Rhythm: Normal rate and regular rhythm.     Heart sounds: Normal heart sounds.  Pulmonary:     Effort: Pulmonary effort is normal.     Breath sounds: Normal breath sounds.  Skin:    General: Skin is warm and dry.  Neurological:     Mental Status: He is alert and oriented to person, place, and time.  Psychiatric:        Behavior: Behavior normal.      No results found for any visits on 09/19/22.    The 10-year ASCVD risk score (Arnett DK, et al., 2019) is: 14.6%    Assessment & Plan:   Problem List Items Addressed This Visit       Cardiovascular and Mediastinum   HYPERTENSION, BENIGN - Primary    Well controlled. Continue current regimen. Follow up in  38mo        Relevant Medications   Olmesartan-amLODIPine-HCTZ 40-10-25 MG TABS   Other Relevant Orders   PSA   Lipid panel    COMPLETE METABOLIC PANEL WITH GFR   CBC     Respiratory   COPD (chronic obstructive pulmonary disease) (HCC)    Stable.  Doing well on Trelegy.  Follow-up in 6 months.  Gets yearly PFTs with Dr. Valorie Roosevelt in [place].      Relevant Medications   Fluticasone-Umeclidin-Vilant (TRELEGY ELLIPTA) 100-62.5-25 MCG/ACT AEPB   Other Relevant Orders   PSA   Lipid panel   COMPLETE METABOLIC PANEL WITH GFR   CBC     Other   TOBACCO ABUSE    Continue to work on quitting smoking.  He has nicotine patches at home.      Gout and pseudogout    No recent flares or exacerbations.  Currently on allopurinol 300 mg daily.      Relevant Medications   allopurinol (ZYLOPRIM) 300 MG tablet   Other Relevant Orders   PSA   Lipid panel   COMPLETE METABOLIC PANEL WITH GFR   CBC   PSA   Lipid panel   COMPLETE METABOLIC PANEL WITH GFR   CBC    Return in about 6 months (around 03/21/2023) for Hypertension,  COPD.    Nani Gasser, MD

## 2022-09-19 NOTE — Assessment & Plan Note (Signed)
No recent flares or exacerbations.  Currently on allopurinol 300 mg daily.

## 2022-09-19 NOTE — Assessment & Plan Note (Addendum)
Continue to work on quitting smoking.  He has nicotine patches at home.

## 2022-09-20 LAB — PSA: PSA: 1.09 ng/mL (ref <=4.00)

## 2022-09-20 LAB — COMPLETE METABOLIC PANEL WITH GFR
AG Ratio: 1.5 (calc) (ref 1.0–2.5)
ALT: 16 U/L (ref 9–46)
AST: 17 U/L (ref 10–35)
Albumin: 4.3 g/dL (ref 3.6–5.1)
Alkaline phosphatase (APISO): 91 U/L (ref 35–144)
BUN: 17 mg/dL (ref 7–25)
CO2: 23 mmol/L (ref 20–32)
Calcium: 9.6 mg/dL (ref 8.6–10.3)
Chloride: 103 mmol/L (ref 98–110)
Creat: 1.1 mg/dL (ref 0.70–1.35)
Globulin: 2.8 g/dL (calc) (ref 1.9–3.7)
Glucose, Bld: 104 mg/dL — ABNORMAL HIGH (ref 65–99)
Potassium: 3.9 mmol/L (ref 3.5–5.3)
Sodium: 136 mmol/L (ref 135–146)
Total Bilirubin: 0.6 mg/dL (ref 0.2–1.2)
Total Protein: 7.1 g/dL (ref 6.1–8.1)
eGFR: 73 mL/min/{1.73_m2} (ref >=60)

## 2022-09-20 LAB — CBC
HCT: 45.8 % (ref 38.5–50.0)
Hemoglobin: 15.8 g/dL (ref 13.2–17.1)
MCH: 34.7 pg — ABNORMAL HIGH (ref 27.0–33.0)
MCHC: 34.5 g/dL (ref 32.0–36.0)
MCV: 100.7 fL — ABNORMAL HIGH (ref 80.0–100.0)
MPV: 11.2 fL (ref 7.5–12.5)
Platelets: 180 10*3/uL (ref 140–400)
RBC: 4.55 10*6/uL (ref 4.20–5.80)
RDW: 13.1 % (ref 11.0–15.0)
WBC: 4.1 10*3/uL (ref 3.8–10.8)

## 2022-09-20 LAB — LIPID PANEL
Cholesterol: 174 mg/dL (ref <200)
HDL: 87 mg/dL (ref >=40)
LDL Cholesterol (Calc): 68 mg/dL (calc)
Non-HDL Cholesterol (Calc): 87 mg/dL (calc) (ref <130)
Total CHOL/HDL Ratio: 2 (calc) (ref <5.0)
Triglycerides: 106 mg/dL (ref <150)

## 2022-09-20 NOTE — Progress Notes (Signed)
Call pt: Labs are stable and cholesterol looks good.  You would also be a good candidate for lung cancer screening if that something that you are open to I can send a note over to our referral coordinator at our pulmonary department that does these.

## 2022-10-03 ENCOUNTER — Ambulatory Visit (INDEPENDENT_AMBULATORY_CARE_PROVIDER_SITE_OTHER): Payer: Medicare Other | Admitting: Family Medicine

## 2022-10-03 DIAGNOSIS — Z0000 Encounter for general adult medical examination without abnormal findings: Secondary | ICD-10-CM

## 2022-10-03 NOTE — Patient Instructions (Signed)
MEDICARE ANNUAL WELLNESS VISIT Health Maintenance Summary and Written Plan of Care  Edward Warren ,  Thank you for allowing me to perform your Medicare Annual Wellness Visit and for your ongoing commitment to your health.   Health Maintenance & Immunization History Health Maintenance  Topic Date Due   DTaP/Tdap/Td (2 - Td or Tdap) 04/18/2021   COVID-19 Vaccine (4 - 2023-24 season) 10/19/2022 (Originally 01/20/2022)   Zoster Vaccines- Shingrix (1 of 2) 01/03/2023 (Originally 12/01/2003)   Lung Cancer Screening  10/03/2023 (Originally 07/31/2023)   INFLUENZA VACCINE  12/21/2022   Medicare Annual Wellness (AWV)  10/03/2023   COLONOSCOPY (Pts 45-38yrs Insurance coverage will need to be confirmed)  03/17/2024   Pneumonia Vaccine 24+ Years old  Completed   Hepatitis C Screening  Completed   HPV VACCINES  Aged Out   Immunization History  Administered Date(s) Administered   Fluad Quad(high Dose 65+) 04/09/2019, 02/25/2020, 02/24/2021, 03/02/2022   Influenza,inj,Quad PF,6+ Mos 02/20/2014, 03/08/2015, 01/12/2016, 03/28/2018   PFIZER(Purple Top)SARS-COV-2 Vaccination 07/15/2019, 08/07/2019, 03/22/2020   PNEUMOCOCCAL CONJUGATE-20 02/24/2021   Pneumococcal Polysaccharide-23 02/20/2014   Tdap 04/19/2011    These are the patient goals that we discussed:  Goals Addressed               This Visit's Progress     Patient Stated (pt-stated)        Patient stated that he would like to loose 10lbs.          This is a list of Health Maintenance Items that are overdue or due now: Health Maintenance Due  Topic Date Due   DTaP/Tdap/Td (2 - Td or Tdap) 04/18/2021   Shingles vaccine   Orders/Referrals Placed Today: No orders of the defined types were placed in this encounter.  (Contact our referral department at [phone] if you have not spoken with someone about your referral appointment within the next 5 days)    Follow-up Plan Follow-up with Agapito Games, MD as  planned Schedule tetanus and shingles vaccine at the pharmacy.  Medicare wellness visit in one year.  AVS printed and mailed to the patient.      Health Maintenance, Male Adopting a healthy lifestyle and getting preventive care are important in promoting health and wellness. Ask your health care provider about: The right schedule for you to have regular tests and exams. Things you can do on your own to prevent diseases and keep yourself healthy. What should I know about diet, weight, and exercise? Eat a healthy diet  Eat a diet that includes plenty of vegetables, fruits, low-fat dairy products, and lean protein. Do not eat a lot of foods that are high in solid fats, added sugars, or sodium. Maintain a healthy weight Body mass index (BMI) is a measurement that can be used to identify possible weight problems. It estimates body fat based on height and weight. Your health care provider can help determine your BMI and help you achieve or maintain a healthy weight. Get regular exercise Get regular exercise. This is one of the most important things you can do for your health. Most adults should: Exercise for at least 150 minutes each week. The exercise should increase your heart rate and make you sweat (moderate-intensity exercise). Do strengthening exercises at least twice a week. This is in addition to the moderate-intensity exercise. Spend less time sitting. Even light physical activity can be beneficial. Watch cholesterol and blood lipids Have your blood tested for lipids and cholesterol at 69 years of age, then  have this test every 5 years. You may need to have your cholesterol levels checked more often if: Your lipid or cholesterol levels are high. You are older than 69 years of age. You are at high risk for heart disease. What should I know about cancer screening? Many types of cancers can be detected early and may often be prevented. Depending on your health history and family  history, you may need to have cancer screening at various ages. This may include screening for: Colorectal cancer. Prostate cancer. Skin cancer. Lung cancer. What should I know about heart disease, diabetes, and high blood pressure? Blood pressure and heart disease High blood pressure causes heart disease and increases the risk of stroke. This is more likely to develop in people who have high blood pressure readings or are overweight. Talk with your health care provider about your target blood pressure readings. Have your blood pressure checked: Every 3-5 years if you are 77-48 years of age. Every year if you are 41 years old or older. If you are between the ages of 66 and 20 and are a current or former smoker, ask your health care provider if you should have a one-time screening for abdominal aortic aneurysm (AAA). Diabetes Have regular diabetes screenings. This checks your fasting blood sugar level. Have the screening done: Once every three years after age 72 if you are at a normal weight and have a low risk for diabetes. More often and at a younger age if you are overweight or have a high risk for diabetes. What should I know about preventing infection? Hepatitis B If you have a higher risk for hepatitis B, you should be screened for this virus. Talk with your health care provider to find out if you are at risk for hepatitis B infection. Hepatitis C Blood testing is recommended for: Everyone born from [birth year] through 1967. Anyone with known risk factors for hepatitis C. Sexually transmitted infections (STIs) You should be screened each year for STIs, including gonorrhea and chlamydia, if: You are sexually active and are younger than 69 years of age. You are older than 69 years of age and your health care provider tells you that you are at risk for this type of infection. Your sexual activity has changed since you were last screened, and you are at increased risk for chlamydia or  gonorrhea. Ask your health care provider if you are at risk. Ask your health care provider about whether you are at high risk for HIV. Your health care provider may recommend a prescription medicine to help prevent HIV infection. If you choose to take medicine to prevent HIV, you should first get tested for HIV. You should then be tested every 3 months for as long as you are taking the medicine. Follow these instructions at home: Alcohol use Do not drink alcohol if your health care provider tells you not to drink. If you drink alcohol: Limit how much you have to 0-2 drinks a day. Know how much alcohol is in your drink. In the [place] one drink equals one 12 oz bottle of beer (355 mL), one 5 oz glass of wine (148 mL), or one 1 oz glass of hard liquor (44 mL). Lifestyle Do not use any products that contain nicotine or tobacco. These products include cigarettes, chewing tobacco, and vaping devices, such as e-cigarettes. If you need help quitting, ask your health care provider. Do not use street drugs. Do not share needles. Ask your health care provider for help if  you need support or information about quitting drugs. General instructions Schedule regular health, dental, and eye exams. Stay current with your vaccines. Tell your health care provider if: You often feel depressed. You have ever been abused or do not feel safe at home. Summary Adopting a healthy lifestyle and getting preventive care are important in promoting health and wellness. Follow your health care provider's instructions about healthy diet, exercising, and getting tested or screened for diseases. Follow your health care provider's instructions on monitoring your cholesterol and blood pressure. This information is not intended to replace advice given to you by your health care provider. Make sure you discuss any questions you have with your health care provider. Document Revised: 09/27/2020 Document Reviewed: 09/27/2020 Elsevier  Patient Education  2023 [employer].

## 2022-10-03 NOTE — Progress Notes (Signed)
MEDICARE ANNUAL WELLNESS VISIT  10/03/2022  Telephone Visit Disclaimer This Medicare AWV was conducted by telephone due to national recommendations for restrictions regarding the COVID-19 Pandemic (e.g. social distancing).  I verified, using two identifiers, that I am speaking with Edward Warren or their authorized healthcare agent. I discussed the limitations, risks, security, and privacy concerns of performing an evaluation and management service by telephone and the potential availability of an in-person appointment in the future. The patient expressed understanding and agreed to proceed.  Location of Patient: Home Location of Provider (nurse):  In the office.  Subjective:    Edward Warren is a 67 y.o. male patient of Metheney, Barbarann Ehlers, MD who had a Medicare Annual Wellness Visit today via telephone. Ishant is Retired and lives with their spouse. he has 1 child. he reports that he is socially active and does interact with friends/family regularly. he is moderately physically active and enjoys gardening and yardwork.  Patient Care Team: Agapito Games, MD as PCP - General Gabriel Carina, [facility] as Pharmacist (Pharmacist)     10/03/2022    1:04 PM 09/28/2021   10:05 AM 02/09/2016    9:00 AM 06/17/2015   11:05 AM 03/06/2014   10:34 AM  Advanced Directives  Does Patient Have a Medical Advance Directive? Yes Yes Yes Yes No  Type of Advance Directive Living will Living will  Healthcare Power of [place];Living will   Does patient want to make changes to medical advance directive? No - Patient declined No - Patient declined     Copy of Healthcare Power of Attorney in Chart?   No - copy requested    Would patient like information on creating a medical advance directive?     Yes - Educational materials given    Hospital Utilization Over the Past 12 Months: # of hospitalizations or ER visits: 0 # of surgeries: 0  Review of Systems    Patient reports that his overall  health is better compared to last year.  History obtained from chart review and the patient  Patient Reported Readings (BP, Pulse, CBG, Weight, etc) none  Pain Assessment Pain : No/denies pain     Current Medications & Allergies (verified) Allergies as of 10/03/2022       Reactions   Prednisone Other (See Comments)   Nightmares and suicidal thoughts        Medication List        Accurate as of Oct 03, 2022  1:18 PM. If you have any questions, ask your nurse or doctor.          albuterol 108 (90 Base) MCG/ACT inhaler Commonly known as: ProAir HFA INHALE 2 PUFFS INTO THE LUNGS EVERY 6 (SIX) HOURS AS NEEDED.   allopurinol 300 MG tablet Commonly known as: ZYLOPRIM Take 1 tablet (300 mg total) by mouth 2 (two) times daily. DX:m10.9   AMBULATORY NON FORMULARY MEDICATION Medication Name: Nebulizer with supplies including tubing etc.  Diagnosis COPD J44.9   colchicine 0.6 MG tablet TAKE 1 TABLET BY MOUTH EVERY DAY   Olmesartan-amLODIPine-HCTZ 40-10-25 MG Tabs Take 1 tablet by mouth daily.   Trelegy Ellipta 100-62.5-25 MCG/ACT Aepb Generic drug: Fluticasone-Umeclidin-Vilant TAKE 1 PUFF BY MOUTH EVERY DAY        History (reviewed): 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HCC)    per patient dx by ct scan 2012   COPD (chronic obstructive pulmonary disease) (  HCC)    PFTS: 7-10: TEV1 ration of 62, FEV1 63% and FVC 80% (mod, class II)   Emphysema of lung (HCC)    History of shingles    Hypertension    Personal history of colonic adenomas 07/05/2012   Past Surgical History:  Procedure Laterality Date   COLONOSCOPY  multiple   Family History  Problem Relation Age of Onset   COPD Mother    Heart attack Father    Stroke Maternal Grandfather    Breast cancer Maternal Aunt    Cancer Maternal Uncle        throat   Esophageal cancer Maternal Uncle    Colon cancer Neg Hx    Colon polyps Neg Hx    Rectal cancer Neg Hx     Stomach cancer Neg Hx    Social History   Socioeconomic History   Marital status: Married    Spouse name: Edward Warren   Number of children: 1   Years of education: 12th grade   Highest education level: 12th grade  Occupational History   Occupation: disabled    Comment: Health and safety inspector   Occupation: part-time  Tobacco Use   Smoking status: Every Day    Packs/day: 0.50    Years: 42.00    Additional pack years: 0.00    Total pack years: 21.00    Types: Cigarettes   Smokeless tobacco: Never   Tobacco comments:    Smokes 1/2 pack a day   Vaping Use   Vaping Use: Never used  Substance and Sexual Activity   Alcohol use: Not Currently   Drug use: No   Sexual activity: Not on file  Other Topics Concern   Not on file  Social History Narrative   Lives with his wife. He has one child. He enjoys working in his yard.   Social Determinants of Health   Financial Resource Strain: Low Risk  (10/03/2022)   Overall Financial Resource Strain (CARDIA)    Difficulty of Paying Living Expenses: Not hard at all  Food Insecurity: No Food Insecurity (10/03/2022)   Hunger Vital Sign    Worried About Running Out of Food in the Last Year: Never true    Ran Out of Food in the Last Year: Never true  Transportation Needs: No Transportation Needs (10/03/2022)   PRAPARE - Administrator, Civil Service (Medical): No    Lack of Transportation (Non-Medical): No  Physical Activity: Insufficiently Active (10/03/2022)   Exercise Vital Sign    Days of Exercise per Week: 1 day    Minutes of Exercise per Session: 20 min  Stress: No Stress Concern Present (10/03/2022)   [employer] of Occupational Health - Occupational Stress Questionnaire    Feeling of Stress : Not at all  Social Connections: Moderately Isolated (10/03/2022)   Social Connection and Isolation Panel [NHANES]    Frequency of Communication with Friends and Family: More than three times a week    Frequency of Social Gatherings with  Friends and Family: Twice a week    Attends Religious Services: Never    Database administrator or Organizations: No    Attends Banker Meetings: Never    Marital Status: Married    Activities of Daily Living    10/03/2022    1:06 PM  In your present state of health, do you have any difficulty performing the following activities:  Hearing? 0  Vision? 0  Difficulty concentrating or making decisions? 0  Walking or climbing  stairs? 1  Comment sometimes stairs are difficult  Dressing or bathing? 0  Doing errands, shopping? 0  Preparing Food and eating ? N  Using the Toilet? N  In the past six months, have you accidently leaked urine? N  Do you have problems with loss of bowel control? N  Managing your Medications? N  Managing your Finances? N  Housekeeping or managing your Housekeeping? N    Patient Education/ Literacy How often do you need to have someone help you when you read instructions, pamphlets, or other written materials from your doctor or pharmacy?: 1 - Never What is the last grade level you completed in school?: 12th grade plus two years of college  Exercise Current Exercise Habits: Home exercise routine, Type of exercise: walking, Time (Minutes): 20, Frequency (Times/Week): 1, Weekly Exercise (Minutes/Week): 20, Intensity: Moderate, Exercise limited by: orthopedic condition(s)  Diet Patient reports consuming 2 meals a day and 1-2 snack(s) a day Patient reports that his primary diet is: Regular Patient reports that she does have regular access to food.   Depression Screen    10/03/2022    1:05 PM 09/19/2022    8:33 AM 03/02/2022   12:09 PM 09/28/2021   10:07 AM 08/31/2021   10:57 AM 08/25/2020    9:54 AM 02/25/2020   11:31 AM  PHQ 2/9 Scores  PHQ - 2 Score 0 0 0 0 0 0 0  PHQ- 9 Score   0         Fall Risk    10/03/2022    1:04 PM 09/19/2022    8:33 AM 03/02/2022   11:22 AM 09/28/2021   10:06 AM 08/31/2021   10:57 AM  Fall Risk   Falls in the  past year? 0 0 0 0 0  Number falls in past yr: 0 0 0 0 0  Injury with Fall? 0 0 0 0 0  Risk for fall due to : No Fall Risks No Fall Risks No Fall Risks No Fall Risks No Fall Risks  Follow up Falls evaluation completed Falls evaluation completed Falls evaluation completed Falls evaluation completed Falls prevention discussed;Falls evaluation completed     Objective:  BREYLEN ANTILLA seemed alert and oriented and he participated appropriately during our telephone visit.  Blood Pressure Weight BMI  BP Readings from Last 3 Encounters:  09/19/22 128/77  03/02/22 119/76  08/31/21 122/86   Wt Readings from Last 3 Encounters:  09/19/22 222 lb (100.7 kg)  03/02/22 225 lb (102.1 kg)  08/31/21 226 lb (102.5 kg)   BMI Readings from Last 1 Encounters:  09/19/22 30.96 kg/m    *Unable to obtain current vital signs, weight, and BMI due to telephone visit type  Hearing/Vision  Claudia did not seem to have difficulty with hearing/understanding during the telephone conversation Reports that he has had a formal eye exam by an eye care professional within the past year Reports that he has had a formal hearing evaluation within the past year *Unable to fully assess hearing and vision during telephone visit type  Cognitive Function:    10/03/2022    1:08 PM 09/28/2021   10:10 AM  6CIT Screen  What Year? 0 points 0 points  What month? 0 points 0 points  What time? 0 points 0 points  Count back from 20 0 points 0 points  Months in reverse 0 points 0 points  Repeat phrase 0 points 0 points  Total Score 0 points 0 points   (Normal:0-7, Significant for Dysfunction: >  8)  Normal Cognitive Function Screening: Yes   Immunization & Health Maintenance Record Immunization History  Administered Date(s) Administered   Fluad Quad(high Dose 65+) 04/09/2019, 02/25/2020, 02/24/2021, 03/02/2022   Influenza,inj,Quad PF,6+ Mos 02/20/2014, 03/08/2015, 01/12/2016, 03/28/2018   PFIZER(Purple Top)SARS-COV-2  Vaccination 07/15/2019, 08/07/2019, 03/22/2020   PNEUMOCOCCAL CONJUGATE-20 02/24/2021   Pneumococcal Polysaccharide-23 02/20/2014   Tdap 04/19/2011    Health Maintenance  Topic Date Due   DTaP/Tdap/Td (2 - Td or Tdap) 04/18/2021   COVID-19 Vaccine (4 - 2023-24 season) 10/19/2022 (Originally 01/20/2022)   Zoster Vaccines- Shingrix (1 of 2) 01/03/2023 (Originally 12/01/2003)   Lung Cancer Screening  10/03/2023 (Originally 07/31/2023)   INFLUENZA VACCINE  12/21/2022   Medicare Annual Wellness (AWV)  10/03/2023   COLONOSCOPY (Pts 45-22yrs Insurance coverage will need to be confirmed)  03/17/2024   Pneumonia Vaccine 73+ Years old  Completed   Hepatitis C Screening  Completed   HPV VACCINES  Aged Out       Assessment  This is a routine wellness examination for [employer].  Health Maintenance: Due or Overdue Health Maintenance Due  Topic Date Due   DTaP/Tdap/Td (2 - Td or Tdap) 04/18/2021    Edward Warren does not need a referral for Community Assistance: Care Management:   no Social Work:    no Prescription Assistance:  no Nutrition/Diabetes Education:  no   Plan:  Personalized Goals  Goals Addressed               This Visit's Progress     Patient Stated (pt-stated)        Patient stated that he would like to loose 10lbs.        Personalized Health Maintenance & Screening Recommendations  Td vaccine Shingles vaccine   Lung Cancer Screening Recommended: yes; completed in March, 2024 at [facility] (Low Dose CT Chest recommended if Age 46-80 years, 30 pack-year currently smoking OR have quit w/in past 15 years) Hepatitis C Screening recommended: no HIV Screening recommended: no  Advanced Directives: Written information was not prepared per patient's request.  Referrals & Orders No orders of the defined types were placed in this encounter.   Follow-up Plan Follow-up with Agapito Games, MD as planned Schedule tetanus and shingles vaccine at the  pharmacy.  Medicare wellness visit in one year.  AVS printed and mailed to the patient.   I have personally reviewed and noted the following in the patient's chart:   Medical and social history Use of alcohol, tobacco or illicit drugs  Current medications and supplements Functional ability and status Nutritional status Physical activity Advanced directives List of other physicians Hospitalizations, surgeries, and ER visits in previous 12 months Vitals Screenings to include cognitive, depression, and falls Referrals and appointments  In addition, I have reviewed and discussed with Edward Warren certain preventive protocols, quality metrics, and best practice recommendations. A written personalized care plan for preventive services as well as general preventive health recommendations is available and can be mailed to the patient at his request.      Modesto Charon, RN BSN   10/03/2022

## 2022-10-12 ENCOUNTER — Telehealth: Payer: Self-pay | Admitting: Family Medicine

## 2022-10-12 NOTE — Telephone Encounter (Signed)
Spouse called. Edward Warren got his tetanus shot on 5/22 at [facility]

## 2022-10-13 NOTE — Telephone Encounter (Signed)
Tdap updated in chart.

## 2023-01-08 ENCOUNTER — Ambulatory Visit: Payer: Medicare Other | Admitting: Family Medicine

## 2023-01-17 ENCOUNTER — Ambulatory Visit: Payer: Medicare Other | Admitting: Family Medicine

## 2023-01-18 ENCOUNTER — Encounter: Payer: Self-pay | Admitting: Family Medicine

## 2023-01-18 ENCOUNTER — Ambulatory Visit: Payer: Medicare Other | Admitting: Family Medicine

## 2023-01-18 VITALS — BP 102/74 | HR 80 | Resp 18 | Ht 71.0 in | Wt 210.0 lb

## 2023-01-18 DIAGNOSIS — J449 Chronic obstructive pulmonary disease, unspecified: Secondary | ICD-10-CM | POA: Diagnosis not present

## 2023-01-18 DIAGNOSIS — M545 Low back pain, unspecified: Secondary | ICD-10-CM

## 2023-01-18 MED ORDER — TRELEGY ELLIPTA 100-62.5-25 MCG/ACT IN AEPB
INHALATION_SPRAY | RESPIRATORY_TRACT | 11 refills | Status: AC
Start: 2023-01-18 — End: ?

## 2023-01-18 MED ORDER — CYCLOBENZAPRINE HCL 5 MG PO TABS: 5.0000 mg | ORAL_TABLET | Freq: Every evening | ORAL | 0 refills | Status: DC | PRN

## 2023-01-18 MED ORDER — MELOXICAM 15 MG PO TABS: 15.0000 mg | ORAL_TABLET | Freq: Every day | ORAL | 0 refills | Status: DC | PRN

## 2023-01-18 NOTE — Assessment & Plan Note (Addendum)
-  very pleasant 67 yo male presents with low back pain  - given patient low back exercises to do - recommend continued movement as tolerated, he can return to the gym as he is able - given meloxicam and flexiril to take - exam shows no red flag symptoms - also recommended salon pas patches to use.  - if no better in two weeks we may need xrays

## 2023-01-18 NOTE — Progress Notes (Signed)
Established patient visit   Patient: Edward Warren   DOB: May 09, 1956   67 y.o. Male  MRN: [id number] Visit Date: 01/18/2023  Today's healthcare provider: Charlton Amor, DO   Chief Complaint  Patient presents with   Back Pain    Lower back x1.5 wks has been using heating pad, Advil, tylenol     SUBJECTIVE    Chief Complaint  Patient presents with   Back Pain    Lower back x1.5 wks has been using heating pad, Advil, tylenol    HPI HPI     Back Pain    Additional comments: Lower back x1.5 wks has been using heating pad, Advil, tylenol       Last edited by Roselyn Reef, CMA on 01/18/2023  9:04 AM.      Pt presents with acute back pain. He was mowing his lawn and hurt his back. Incident happened about 1 1/2 weeks ago. He does note minor improvement with tylenol, advil, and a heating pad. Denies radiculopathy. Localizes pain to low back paraspinal muscles.  Review of Systems  Constitutional:  Negative for activity change, fatigue and fever.  Respiratory:  Negative for cough and shortness of breath.   Cardiovascular:  Negative for chest pain.  Gastrointestinal:  Negative for abdominal pain.  Genitourinary:  Negative for difficulty urinating.  Musculoskeletal:  Positive for back pain.       Current Meds  Medication Sig   albuterol (PROAIR HFA) 108 (90 Base) MCG/ACT inhaler INHALE 2 PUFFS INTO THE LUNGS EVERY 6 (SIX) HOURS AS NEEDED.   allopurinol (ZYLOPRIM) 300 MG tablet Take 1 tablet (300 mg total) by mouth 2 (two) times daily. DX:m10.9   AMBULATORY NON FORMULARY MEDICATION Medication Name: Nebulizer with supplies including tubing etc.  Diagnosis COPD J44.9   colchicine 0.6 MG tablet TAKE 1 TABLET BY MOUTH EVERY DAY   cyclobenzaprine (FLEXERIL) 5 MG tablet Take 1 tablet (5 mg total) by mouth at bedtime as needed for muscle spasms.   meloxicam (MOBIC) 15 MG tablet Take 1 tablet (15 mg total) by mouth daily as needed for pain.   Olmesartan-amLODIPine-HCTZ  40-10-25 MG TABS Take 1 tablet by mouth daily.   [DISCONTINUED] Fluticasone-Umeclidin-Vilant (TRELEGY ELLIPTA) 100-62.5-25 MCG/ACT AEPB TAKE 1 PUFF BY MOUTH EVERY DAY    OBJECTIVE    BP 102/74 (BP Location: Left Arm, Patient Position: Sitting, Cuff Size: Large)   Pulse 80   Resp 18   Ht 5\' 11"  (1.803 m)   Wt 210 lb (95.3 kg)   SpO2 100%   BMI 29.29 kg/m   Physical Exam Vitals and nursing note reviewed.  Constitutional:      General: He is not in acute distress.    Appearance: Normal appearance.  HENT:     Head: Normocephalic and atraumatic.     Right Ear: External ear normal.     Left Ear: External ear normal.     Nose: Nose normal.  Eyes:     Conjunctiva/sclera: Conjunctivae normal.  Cardiovascular:     Rate and Rhythm: Normal rate and regular rhythm.  Pulmonary:     Effort: Pulmonary effort is normal.     Breath sounds: Normal breath sounds.  Musculoskeletal:     Comments: Tenderness to palpation of right sided paraspinal muscles of low back. Non-tender to T and L spine  Neurological:     General: No focal deficit present.     Mental Status: He is alert and oriented to person, place, and  time.  Psychiatric:        Mood and Affect: Mood normal.        Behavior: Behavior normal.        Thought Content: Thought content normal.        Judgment: Judgment normal.        ASSESSMENT & PLAN    Problem List Items Addressed This Visit       Respiratory   COPD (chronic obstructive pulmonary disease) (HCC)   Relevant Medications   Fluticasone-Umeclidin-Vilant (TRELEGY ELLIPTA) 100-62.5-25 MCG/ACT AEPB     Other   Acute right-sided low back pain without sciatica - Primary    -very pleasant 67 yo male presents with low back pain  - given patient low back exercises to do - recommend continued movement as tolerated, he can return to the gym as he is able - given meloxicam and flexiril to take - exam shows no red flag symptoms - also recommended salon pas patches to  use.  - if no better in two weeks we may need xrays       Relevant Medications   cyclobenzaprine (FLEXERIL) 5 MG tablet   meloxicam (MOBIC) 15 MG tablet    Return if symptoms worsen or fail to improve.      Meds ordered this encounter  Medications   cyclobenzaprine (FLEXERIL) 5 MG tablet    Sig: Take 1 tablet (5 mg total) by mouth at bedtime as needed for muscle spasms.    Dispense:  15 tablet    Refill:  0   meloxicam (MOBIC) 15 MG tablet    Sig: Take 1 tablet (15 mg total) by mouth daily as needed for pain.    Dispense:  15 tablet    Refill:  0   Fluticasone-Umeclidin-Vilant (TRELEGY ELLIPTA) 100-62.5-25 MCG/ACT AEPB    Sig: TAKE 1 PUFF BY MOUTH EVERY DAY    Dispense:  60 each    Refill:  11    No orders of the defined types were placed in this encounter.    Charlton Amor, DO  [facility] at [facility] [phone] (phone) [phone] (fax)  [facility]

## 2023-01-18 NOTE — Patient Instructions (Signed)
You can pick up lidocaine patches (salon pas patches) over the counter

## 2023-03-21 ENCOUNTER — Encounter: Payer: Self-pay | Admitting: Family Medicine

## 2023-03-21 ENCOUNTER — Ambulatory Visit (INDEPENDENT_AMBULATORY_CARE_PROVIDER_SITE_OTHER): Payer: Medicare Other | Admitting: Family Medicine

## 2023-03-21 VITALS — BP 126/77 | HR 86 | Ht 71.0 in | Wt 211.0 lb

## 2023-03-21 DIAGNOSIS — J449 Chronic obstructive pulmonary disease, unspecified: Secondary | ICD-10-CM | POA: Diagnosis not present

## 2023-03-21 DIAGNOSIS — Z77090 Contact with and (suspected) exposure to asbestos: Secondary | ICD-10-CM

## 2023-03-21 DIAGNOSIS — F1020 Alcohol dependence, uncomplicated: Secondary | ICD-10-CM

## 2023-03-21 DIAGNOSIS — F17200 Nicotine dependence, unspecified, uncomplicated: Secondary | ICD-10-CM | POA: Diagnosis not present

## 2023-03-21 DIAGNOSIS — I10 Essential (primary) hypertension: Secondary | ICD-10-CM | POA: Diagnosis not present

## 2023-03-21 DIAGNOSIS — I2510 Atherosclerotic heart disease of native coronary artery without angina pectoris: Secondary | ICD-10-CM

## 2023-03-21 DIAGNOSIS — J61 Pneumoconiosis due to asbestos and other mineral fibers: Secondary | ICD-10-CM | POA: Diagnosis not present

## 2023-03-21 NOTE — Assessment & Plan Note (Signed)
Doing well on trelegy. No recent flares or changes.

## 2023-03-21 NOTE — Assessment & Plan Note (Signed)
Encouraged smoking cessation 

## 2023-03-21 NOTE — Assessment & Plan Note (Signed)
Well controlled. Continue current regimen. Follow up in  6 mo  

## 2023-03-21 NOTE — Assessment & Plan Note (Addendum)
Follows with Dr. Esau Grew yearly. Last CT in March at [place].

## 2023-03-21 NOTE — Assessment & Plan Note (Signed)
LDL has looked great under 100 but he is also a smoker. Will check lipoprotein a and consider statin. Discussed that today.    The 10-year ASCVD risk score (Arnett DK, et al., 2019) is: 15.2%   Values used to calculate the score:     Age: 67 years     Sex: Male     Is Non-Hispanic African American: Yes     Diabetic: No     Tobacco smoker: Yes     Systolic Blood Pressure: 126 mmHg     Is BP treated: No     HDL Cholesterol: 87 mg/dL     Total Cholesterol: 174 mg/dL

## 2023-03-21 NOTE — Progress Notes (Signed)
Established Patient Office Visit  Subjective   Patient ID: Edward Warren, male    DOB: Jan 12, 1956  Age: 67 y.o. MRN: [id number]  Chief Complaint  Patient presents with   Hypertension    HPI    Hypertension- Pt denies chest pain, SOB, dizziness, or heart palpitations.  Taking meds as directed w/o problems.  Denies medication side effects.    F/U COPD/Asbestosis- on Trelegy.   Coronary artery calcification notes on Chest Ct back in March.   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 Working 5 days per week and enjoying it.      ROS    Objective:     BP 126/77   Pulse 86   Ht 5\' 11"  (1.803 m)   Wt 211 lb (95.7 kg)   SpO2 100%   BMI 29.43 kg/m    Physical Exam Vitals and nursing note reviewed.  Constitutional:      Appearance: Normal appearance.  HENT:     Head: Normocephalic and atraumatic.  Eyes:     Conjunctiva/sclera: Conjunctivae normal.  Cardiovascular:     Rate and Rhythm: Normal rate and regular rhythm.  Pulmonary:     Effort: Pulmonary effort is normal.     Breath sounds: Normal breath sounds.  Skin:    General: Skin is warm and dry.  Neurological:     Mental Status: He is alert.  Psychiatric:        Mood and Affect: Mood normal.      No results found for any visits on 03/21/23.    The 10-year ASCVD risk score (Arnett DK, et al., 2019) is: 15.2%    Assessment & Plan:   Problem List Items Addressed This Visit       Cardiovascular and Mediastinum   HYPERTENSION, BENIGN - Primary    Well controlled. Continue current regimen. Follow up in  66mo       Relevant Orders   CMP14+EGFR   Hemoglobin A1c   Lipoprotein A (LPA)   Coronary artery calcification    LDL has looked great under 100 but he is also a smoker. Will check lipoprotein a and consider statin. Discussed that today.    The 10-year ASCVD risk score (Arnett DK, et al., 2019) is: 15.2%   Values used to calculate the score:     Age: 66 years     Sex: Male     Is Non-Hispanic  African American: Yes     Diabetic: No     Tobacco smoker: Yes     Systolic Blood Pressure: 126 mmHg     Is BP treated: No     HDL Cholesterol: 87 mg/dL     Total Cholesterol: 174 mg/dL       Relevant Orders   CMP14+EGFR   Hemoglobin A1c   Lipoprotein A (LPA)     Respiratory   COPD (chronic obstructive pulmonary disease) (HCC)    Doing well on trelegy. No recent flares or changes.        Relevant Orders   CMP14+EGFR   Hemoglobin A1c   Lipoprotein A (LPA)   Asbestosis (HCC)    Follows with Dr. Esau Grew yearly. Last CT in March at [place].          Other   TOBACCO ABUSE    Encouraged smoking cessation      H/O asbestos exposure   Alcohol abuse with physiological dependence (HCC)    Return in about 27 weeks (around 09/26/2023) for Hypertension.  Nani Gasser, MD

## 2023-03-22 LAB — CMP14+EGFR
ALT: 16 [IU]/L (ref 0–44)
AST: 19 [IU]/L (ref 0–40)
Albumin: 4.1 g/dL (ref 3.9–4.9)
Alkaline Phosphatase: 93 [IU]/L (ref 44–121)
BUN/Creatinine Ratio: 15 (ref 10–24)
BUN: 16 mg/dL (ref 8–27)
Bilirubin Total: 0.6 mg/dL (ref 0.0–1.2)
CO2: 19 mmol/L — ABNORMAL LOW (ref 20–29)
Calcium: 9.5 mg/dL (ref 8.6–10.2)
Chloride: 105 mmol/L (ref 96–106)
Creatinine, Ser: 1.09 mg/dL (ref 0.76–1.27)
Globulin, Total: 2.5 g/dL (ref 1.5–4.5)
Glucose: 105 mg/dL — ABNORMAL HIGH (ref 70–99)
Potassium: 4.2 mmol/L (ref 3.5–5.2)
Sodium: 138 mmol/L (ref 134–144)
Total Protein: 6.6 g/dL (ref 6.0–8.5)
eGFR: 73 mL/min/{1.73_m2} (ref >59)

## 2023-03-22 LAB — HEMOGLOBIN A1C
Est. average glucose Bld gHb Est-mCnc: 105 mg/dL
Hgb A1c MFr Bld: 5.3 % (ref 4.8–5.6)

## 2023-03-22 LAB — LIPOPROTEIN A (LPA): Lipoprotein (a): <8.4 nmol/L (ref <75.0)

## 2023-03-23 NOTE — Progress Notes (Signed)
Hi Edward Warren, metabolic panel overall looks good A1c is 5.3 which is great.  We did do that extra test for LP(a) and you were negative which is actually pretty reassuring.  Calculate your 10-year cardiovascular risk score out and because of several risk factors your current risk is 15% which is very high.  Anything 10% or higher they highly recommend a statin to reduce your risk for stroke and heart attack.  I would encourage you to consider at least trying a statin if you are okay with this please let me know and I can send over prescription to your pharmacy.  The 10-year ASCVD risk score (Arnett DK, et al., 2019) is: 15.2%   Values used to calculate the score:     Age: 67 years     Sex: Male     Is Non-Hispanic African American: Yes     Diabetic: No     Tobacco smoker: Yes     Systolic Blood Pressure: 126 mmHg     Is BP treated: No     HDL Cholesterol: 87 mg/dL     Total Cholesterol: 174 mg/dL

## 2023-03-26 MED ORDER — ATORVASTATIN CALCIUM 20 MG PO TABS: 20.0000 mg | ORAL_TABLET | Freq: Every day | ORAL | 3 refills | Status: AC

## 2023-03-26 NOTE — Addendum Note (Signed)
Addended by: Nani Gasser D on: 03/26/2023 12:59 PM   Modules accepted: Orders

## 2023-03-26 NOTE — Progress Notes (Signed)
Orders Placed This Encounter     atorvastatin (LIPITOR) 20 MG tablet         Sig: Take 1 tablet (20 mg total) by mouth at bedtime.         Dispense:  90 tablet         Refill:  3

## 2023-06-26 ENCOUNTER — Other Ambulatory Visit: Payer: Self-pay | Admitting: Family Medicine

## 2023-06-26 DIAGNOSIS — I10 Essential (primary) hypertension: Secondary | ICD-10-CM

## 2023-09-10 ENCOUNTER — Ambulatory Visit (INDEPENDENT_AMBULATORY_CARE_PROVIDER_SITE_OTHER)

## 2023-09-10 ENCOUNTER — Ambulatory Visit: Payer: Self-pay

## 2023-09-10 ENCOUNTER — Encounter: Payer: Self-pay | Admitting: Medical-Surgical

## 2023-09-10 ENCOUNTER — Ambulatory Visit (INDEPENDENT_AMBULATORY_CARE_PROVIDER_SITE_OTHER): Admitting: Medical-Surgical

## 2023-09-10 VITALS — BP 110/68 | HR 80 | Resp 20 | Ht 71.0 in | Wt 206.1 lb

## 2023-09-10 DIAGNOSIS — M25572 Pain in left ankle and joints of left foot: Secondary | ICD-10-CM | POA: Diagnosis not present

## 2023-09-10 DIAGNOSIS — M79672 Pain in left foot: Secondary | ICD-10-CM

## 2023-09-10 DIAGNOSIS — S99922A Unspecified injury of left foot, initial encounter: Secondary | ICD-10-CM | POA: Diagnosis not present

## 2023-09-10 DIAGNOSIS — W1843XA Slipping, tripping and stumbling without falling due to stepping from one level to another, initial encounter: Secondary | ICD-10-CM | POA: Diagnosis not present

## 2023-09-10 NOTE — Progress Notes (Signed)
        Established patient visit  History, exam, impression, and plan:  1. Left foot pain (Primary) Very pleasant 68 year old male presenting today with complaints of left foot pain.  On Saturday evening, he was coming down stairs and tripped.  Noted that his left knee and lower leg were sore and has had some bruising.  His ankle is a bit swollen and has some erythema/bruising but he reports this is not painful either.  The most pain he has is in the forefoot whenever he puts any pressure on it.  Walking with a cane and placing his full weight on his heel to avoid pain.  At rest, pain is described as 4/10 however it goes to 10/10 with any pressure.  On exam, he has exquisite tenderness to the 2nd through 4th metatarsal along with nonpitting edema to the forefoot.  No bruising noted to the forefoot.  He has taken Aleve a couple of times which seems to be helpful.  Has also soaked it in warm Epsom salt water.  Discussed options for evaluation.  We will go ahead and get left foot x-rays today.  Offered a walking boot to assist with offloading the pressure but he would like to hold off until x-rays have been reviewed.  Recommend Aleve 1 tablet twice daily for at least 2 weeks.  Offered analgesics but he declined.  Reviewed RICE recommendations. - DG Foot Complete Left; Future  Addendum: After personal review of x-ray images and consultation with Dr. Sandy Crumb, patient noted to have a second metatarsal fracture.  Recommend a postop shoe whenever up and around.  Also recommend continuing conservative measures, especially rest.  He will need to be out of work until further notice.  Suspect extended healing time due to smoking status.  Work note written and printed/sent to patient.  Recommend follow-up with Dr. Sandy Crumb 6-8 weeks.  Procedures performed this visit: None.  Return if symptoms worsen or fail to improve.  __________________________________ Maryl Snook, DNP, APRN, FNP-BC Primary Care  and Sports Medicine [facility]

## 2023-09-10 NOTE — Telephone Encounter (Signed)
 Patient scheduled today with Christen Butter, NP

## 2023-09-10 NOTE — Telephone Encounter (Signed)
  Chief Complaint: fall, left foot injury Symptoms: left foot redness, swelling, pain; left knee swelling Frequency: x 1.5 days Pertinent Negatives: Patient denies deformities, cuts/breaks in skin. Disposition: [] ED /[] Urgent Care (no appt availability in office) / [x] Appointment(In office/virtual)/ []  [facility] [facility]/ [] Home Care/ [] Refused Recommended Disposition /[] [facility]/ []  Follow-up with PCP Additional Notes: Patient states he has been soaking his left foot in Epsom salt and took 2 Aleve yesterday, which has helped with pain and swelling. Offered patient earliest appointment available with PCP, tomorrow. Patient state he would really like to be seen today. Scheduled with NP [place].  Copied from CRM [id number]. Topic: Clinical - Red Word Triage >> Sep 10, 2023  7:41 AM Edward Warren wrote: [facility] that prompted transfer to Nurse Triage: Patient stated he fell in his garage on late Saturday night after falling he noticed swelling in his knee, this morning he's awakened to pain in the left foot and swelling and is having to use a can to walk with. Reason for Disposition  [1] Limp when walking AND [2] due to a twisted ankle or foot  Answer Assessment - Initial Assessment Questions 1. MECHANISM: "How did the injury happen?" (e.g., twisting injury, direct blow)      Tripped and fell going down the stairs, states he fell on the cements. Thinks he landed on left knee possibly.  2. ONSET: "When did the injury happen?" (Minutes or hours ago)      Saturday night.  3. LOCATION: "Where is the injury located?"      Left foot, primarily toes.  4. APPEARANCE of INJURY: "What does the injury look like?"      Redness and swelling around toes.  5. WEIGHT-BEARING: "Can you put weight on that foot?" "Can you walk (four steps or more)?"       He states he can bear weight but is walking with a cane.  6. SIZE: For cuts, bruises, or swelling, ask: "How large is it?" (e.g., inches or  centimeters;  entire joint)      3-4 inches of bruising and swelling from left big toe thru foot.  7. PAIN: "Is there pain?" If Yes, ask: "How bad is the pain?"    (e.g., Scale 1-10; or mild, moderate, severe)   - NONE (0): no pain.   - MILD (1-3): doesn't interfere with normal activities.    - MODERATE (4-7): interferes with normal activities (e.g., work or school) or awakens from sleep, limping.    - SEVERE (8-10): excruciating pain, unable to do any normal activities, unable to walk.      4/10.  8. TETANUS: For any breaks in the skin, ask: "When was the last tetanus booster?"     Denies.  9. OTHER SYMPTOMS: "Do you have any other symptoms?"      Left knee swelling intermittent.  10. PREGNANCY: "Is there any chance you are pregnant?" "When was your last menstrual period?"       N/A.  Protocols used: Ankle and Foot Injury-A-AH

## 2023-09-12 ENCOUNTER — Encounter: Payer: Self-pay | Admitting: Medical-Surgical

## 2023-09-26 ENCOUNTER — Ambulatory Visit: Payer: Medicare Other | Admitting: Family Medicine

## 2023-10-01 ENCOUNTER — Ambulatory Visit: Payer: Self-pay

## 2023-10-01 NOTE — Telephone Encounter (Signed)
 Copied from CRM [id number]. Topic: Clinical - Red Word Triage >> Oct 01, 2023 12:51 PM Brynn Caras wrote: Red Word that prompted transfer to Nurse Triage: PT states when he coughs, he strains and loses his vision for a couple of seconds, he states these blackouts have been happening since last week.   Chief Complaint: Cough Symptoms: Cough, intermittent vision problems  Frequency: Intermittent  Pertinent Negatives: Patient denies fever or other symptoms at this time Disposition: [] ED /[] Urgent Care (no appt availability in office) / [x] Appointment(In office/virtual)/ []  [facility] [facility]/ [] Home Care/ [] Refused Recommended Disposition /[] [facility] [place] [facility]/ []  Follow-up with PCP Additional Notes: Patient states that he has had a worsening cough for the last month. He states that for the last week he will intermittently have some vision loss after coughing. He states the vision loss lasts a few seconds and then comes back to normal. He denies any other symptoms or complaints. Appointment made for the patient on Wednesday for evaluation and treatment of his symptoms.     Reason for Disposition  Cough has been present for > 3 weeks  Answer Assessment - Initial Assessment Questions 1. ONSET: "When did the cough begin?"      1 month  2. SEVERITY: "How bad is the cough today?"      Moderate  3. SPUTUM: "Describe the color of your sputum" (none, dry cough; clear, white, yellow, green)     No 4. HEMOPTYSIS: "Are you coughing up any blood?" If so ask: "How much?" (flecks, streaks, tablespoons, etc.)     No 5. DIFFICULTY BREATHING: "Are you having difficulty breathing?" If Yes, ask: "How bad is it?" (e.g., mild, moderate, severe)    - MILD: No SOB at rest, mild SOB with walking, speaks normally in sentences, can lie down, no retractions, pulse < 100.    - MODERATE: SOB at rest, SOB with minimal exertion and prefers to sit, cannot lie down flat, speaks in phrases, mild retractions, audible  wheezing, pulse 100-120.    - SEVERE: Very SOB at rest, speaks in single words, struggling to breathe, sitting hunched forward, retractions, pulse > 120      No 6. FEVER: "Do you have a fever?" If Yes, ask: "What is your temperature, how was it measured, and when did it start?"     No 7. CARDIAC HISTORY: "Do you have any history of heart disease?" (e.g., heart attack, congestive heart failure)      No 8. LUNG HISTORY: "Do you have any history of lung disease?"  (e.g., pulmonary embolus, asthma, emphysema)     Yes 9. PE RISK FACTORS: "Do you have a history of blood clots?" (or: recent major surgery, recent prolonged travel, bedridden)     No 10. OTHER SYMPTOMS: "Do you have any other symptoms?" (e.g., runny nose, wheezing, chest pain)       Loss of vision for a few seconds after coughing  Protocols used: Cough - Acute Non-Productive-A-AH

## 2023-10-03 ENCOUNTER — Encounter: Payer: Self-pay | Admitting: Family Medicine

## 2023-10-03 ENCOUNTER — Ambulatory Visit (INDEPENDENT_AMBULATORY_CARE_PROVIDER_SITE_OTHER): Admitting: Family Medicine

## 2023-10-03 ENCOUNTER — Ambulatory Visit

## 2023-10-03 VITALS — BP 90/60 | HR 81 | Ht 71.0 in | Wt 206.0 lb

## 2023-10-03 DIAGNOSIS — J449 Chronic obstructive pulmonary disease, unspecified: Secondary | ICD-10-CM | POA: Diagnosis not present

## 2023-10-03 DIAGNOSIS — R079 Chest pain, unspecified: Secondary | ICD-10-CM | POA: Diagnosis not present

## 2023-10-03 DIAGNOSIS — R053 Chronic cough: Secondary | ICD-10-CM

## 2023-10-03 DIAGNOSIS — R059 Cough, unspecified: Secondary | ICD-10-CM

## 2023-10-03 DIAGNOSIS — R0789 Other chest pain: Secondary | ICD-10-CM | POA: Diagnosis not present

## 2023-10-03 DIAGNOSIS — I10 Essential (primary) hypertension: Secondary | ICD-10-CM

## 2023-10-03 DIAGNOSIS — I952 Hypotension due to drugs: Secondary | ICD-10-CM

## 2023-10-03 MED ORDER — DOXYCYCLINE HYCLATE 100 MG PO TABS: 100.0000 mg | ORAL_TABLET | Freq: Two times a day (BID) | ORAL | 0 refills | Status: AC

## 2023-10-03 MED ORDER — OLMESARTAN-AMLODIPINE-HCTZ 40-5-12.5 MG PO TABS: 1.0000 | ORAL_TABLET | Freq: Every day | ORAL | 1 refills | Status: DC

## 2023-10-03 NOTE — Progress Notes (Signed)
 Established Patient Office Visit  Subjective  Patient ID: Edward Warren, male    DOB: 05/04/1956  Age: 68 y.o. MRN: [id number]  Chief Complaint  Patient presents with   Cough    X 1 month. Pt stated that the cough is worse whenever he smokes. He hasn't tried any OTC cough medication for this. He stated that the cough is dry and non productive.    Loss of Vision    Associated with coughing spell that resolves immediately after he stops coughing.  He said that he plans on scheduling an eye exam today.     HPI  Dry cough > 3 month. Worse when he smokes.  No fever or chills.  He has lost about 25 lbs. He has been eating better and walks 4-5 miles per day at his job.  He feels like the cough is more in his chest.  He has a history of asbestosis.  He says when he coughs sometimes he coughs so hard that he actually feels like he is going to pass out he says that is also been going on for couple of months but it happened when he was also riding his lawnmower this past week and said that he could not see for a few seconds and almost hit the fence on his riding lawnmower.  Last Chest CT done at [facility] on 08/03/23. No acute finding just showed mild upper lob emphysema.  Severe CAD.  Hsx of asbestosis.    Has pain on left side of chest that started with coughing but hasn't gone away.       ROS    Objective:     BP 90/60   Pulse 81   Ht 5\' 11"  (1.803 m)   Wt 206 lb 0.6 oz (93.5 kg)   SpO2 98%   BMI 28.74 kg/m    Physical Exam Vitals and nursing note reviewed.  Constitutional:      Appearance: Normal appearance.  HENT:     Head: Normocephalic and atraumatic.  Eyes:     Conjunctiva/sclera: Conjunctivae normal.  Cardiovascular:     Rate and Rhythm: Normal rate and regular rhythm.  Pulmonary:     Effort: Pulmonary effort is normal.     Breath sounds: Rhonchi present. No wheezing.  Skin:    General: Skin is warm and dry.  Neurological:     Mental Status: He is alert.   Psychiatric:        Mood and Affect: Mood normal.      No results found for any visits on 10/03/23.    The 10-year ASCVD risk score (Arnett DK, et al., 2019) is: 8.6%    Assessment & Plan:   Problem List Items Addressed This Visit       Cardiovascular and Mediastinum   HYPERTENSION, BENIGN   Relevant Medications   Olmesartan -amLODIPine -HCTZ 40-5-12.5 MG TABS     Respiratory   COPD (chronic obstructive pulmonary disease) (HCC)   He has been out of his Trelegy and just restarted in about 2 days ago and feels that has helped.  Encouraged him to continue with the medication daily  that may be c contributing to increase in cough.      Other Visit Diagnoses       Hypotension due to drugs    -  Primary   Relevant Medications   Olmesartan -amLODIPine -HCTZ 40-5-12.5 MG TABS   Other Relevant Orders   CBC with Differential/Platelet   CMP14+EGFR     Chronic cough  Relevant Orders   DG Chest 2 View   CBC with Differential/Platelet   CMP14+EGFR       Chronic cough-has been going on for 2 months.  Will go ahead and get a chest x-ray today even though she did have recent CT done in March just to rule out any major changes, edema or infection we will call the results once available.  He is back on his Trelegy which I think will help as well.  Also consider may just be a COPD exacerbation even though it has been going on for a while he does not tolerate prednisone  but we could treat with a round of doxycycline  to see if that is helpful as well.  Hypotension-I think it is most likely related to the fact that he is lost 25 pounds so I think he is most likely overmedicated at this point S[place] and adjust his Tribenzor dose down and have him try that for 30 days and see if home blood pressures look better as well.  No follow-ups on file.    Duaine German, MD

## 2023-10-03 NOTE — Patient Instructions (Signed)
 Check BP on lower dose of BP medication

## 2023-10-03 NOTE — Assessment & Plan Note (Signed)
 He has been out of his Trelegy and just restarted in about 2 days ago and feels that has helped.  Encouraged him to continue with the medication daily  that may be c contributing to increase in cough.

## 2023-10-04 ENCOUNTER — Ambulatory Visit: Payer: Self-pay | Admitting: Family Medicine

## 2023-10-04 ENCOUNTER — Encounter: Payer: Self-pay | Admitting: Family Medicine

## 2023-10-04 LAB — CMP14+EGFR
ALT: 30 IU/L (ref 0–44)
AST: 48 IU/L — ABNORMAL HIGH (ref 0–40)
Albumin: 4.1 g/dL (ref 3.9–4.9)
Alkaline Phosphatase: 98 IU/L (ref 44–121)
BUN/Creatinine Ratio: 20 (ref 10–24)
BUN: 23 mg/dL (ref 8–27)
Bilirubin Total: 0.3 mg/dL (ref 0.0–1.2)
CO2: 17 mmol/L — ABNORMAL LOW (ref 20–29)
Calcium: 9.3 mg/dL (ref 8.6–10.2)
Chloride: 100 mmol/L (ref 96–106)
Creatinine, Ser: 1.14 mg/dL (ref 0.76–1.27)
Globulin, Total: 2.3 g/dL (ref 1.5–4.5)
Glucose: 78 mg/dL (ref 70–99)
Potassium: 4.9 mmol/L (ref 3.5–5.2)
Sodium: 133 mmol/L — ABNORMAL LOW (ref 134–144)
Total Protein: 6.4 g/dL (ref 6.0–8.5)
eGFR: 70 mL/min/{1.73_m2} (ref >59)

## 2023-10-04 LAB — CBC WITH DIFFERENTIAL/PLATELET
Basophils Absolute: 0.1 10*3/uL (ref 0.0–0.2)
Basos: 1 %
EOS (ABSOLUTE): 0.2 10*3/uL (ref 0.0–0.4)
Eos: 4 %
Hematocrit: 40.5 % (ref 37.5–51.0)
Hemoglobin: 13.5 g/dL (ref 13.0–17.7)
Immature Grans (Abs): 0 10*3/uL (ref 0.0–0.1)
Immature Granulocytes: 0 %
Lymphocytes Absolute: 1.9 10*3/uL (ref 0.7–3.1)
Lymphs: 37 %
MCH: 35.1 pg — ABNORMAL HIGH (ref 26.6–33.0)
MCHC: 33.3 g/dL (ref 31.5–35.7)
MCV: 105 fL — ABNORMAL HIGH (ref 79–97)
Monocytes Absolute: 0.5 10*3/uL (ref 0.1–0.9)
Monocytes: 9 %
Neutrophils Absolute: 2.4 10*3/uL (ref 1.4–7.0)
Neutrophils: 49 %
Platelets: 191 10*3/uL (ref 150–450)
RBC: 3.85 x10E6/uL — ABNORMAL LOW (ref 4.14–5.80)
RDW: 12.9 % (ref 11.6–15.4)
WBC: 4.9 10*3/uL (ref 3.4–10.8)

## 2023-10-04 NOTE — Progress Notes (Signed)
 Hi Edward Warren, hemoglobin is stable no anemia.  Sodium level was just a little low not quite sure why normal years looks great it is not in a worrisome range but it was definitely off a little bit.  And your AST liver enzyme was also up just a little bit.  I would like to recheck a metabolic panel in about 3 weeks just to make sure that it resolves and if it still an ongoing issue then we may need to work it up a little further to figure out why.

## 2023-10-04 NOTE — Progress Notes (Signed)
 [place], great news!  Normal chest x-ray no concerning findings.  Send no excess fluid or nodules or infection.  I do think getting back on your regular inhalers for your chest will make a difference.  Fully the round of doxycycline  will help as well.

## 2023-10-08 ENCOUNTER — Ambulatory Visit: Admitting: Family Medicine

## 2023-10-09 ENCOUNTER — Encounter: Payer: Self-pay | Admitting: Family Medicine

## 2023-10-09 ENCOUNTER — Ambulatory Visit (INDEPENDENT_AMBULATORY_CARE_PROVIDER_SITE_OTHER): Payer: Medicare Other

## 2023-10-09 VITALS — Ht 71.0 in | Wt 206.0 lb

## 2023-10-09 DIAGNOSIS — Z0000 Encounter for general adult medical examination without abnormal findings: Secondary | ICD-10-CM

## 2023-10-09 NOTE — Patient Instructions (Signed)
 [name] , Thank you for taking time to come for your Medicare Wellness Visit. I appreciate your ongoing commitment to your health goals. Please review the following plan we discussed and let me know if I can assist you in the future.   These are the goals we discussed:  Goals       Medication Management      Patient Goals/Self-Care Activities Over the next 60 days, patient will:  collaborate with provider on medication access solutions  Follow Up Plan: Telephone follow up appointment with care management team member scheduled for:  2 months       Patient Stated (pt-stated)      09/28/2021 AWV Goal: Tobacco Cessation  Smoking cessation instruction/counseling given:  counseled patient on the dangers of tobacco use, advised patient to stop smoking, and reviewed strategies to maximize success  Patient will verbalize understanding of the health risks associated with smoking/tobacco use Lung cancer or lung disease, such as COPD Heart disease. Stroke. Heart attack Infertility Osteoporosis and bone fractures. Patient will create a plan to quit smoking/using tobacco Pick a date to quit.  Write down the reasons why you are quitting and put it where you will see it often. Identify the people, places, things, and activities that make you want to smoke (triggers) and avoid them. Make sure to take these actions: Throw away all cigarettes at home, at work, and in your car. Throw away smoking accessories, such as Set designer. Clean your car and make sure to empty the ashtray. Clean your home, including curtains and carpets. Tell your family, friends, and coworkers that you are quitting. Support from your loved ones can make quitting easier. Talk with your health care provider about your options for quitting smoking. Find out what treatment options are covered by your health insurance. Patient will be able to demonstrate knowledge of tobacco cessation strategies that may maximize  success Quitting "cold [place]" is more successful than gradually quitting. Attending in-person counseling to help you build problem-solving skills.  Finding resources and support systems that can help you to quit smoking and remain smoke-free after you quit. These resources are most helpful when you use them often. They can include: Online chats with a Veterinary surgeon. Telephone quitlines. Printed Materials engineer. Support groups or group counseling. Text messaging programs. Mobile phone applications. Taking medicines to help you quit smoking: Nicotine  patches, gum, or lozenges. Nicotine  inhalers or sprays. Non-nicotine  medicine that is taken by mouth. Patient will note get discouraged if the process is difficult Over the next year, patient will stop smoking or using other forms of tobacco  Smoking cessation instruction/counseling given:  counseled patient on the dangers of tobacco use, advised patient to stop smoking, and reviewed strategies to maximize success        Patient Stated (pt-stated)      Patient stated that he would like to loose 10lbs.       Patient Stated      Patient stated he would like increase exercise.         This is a list of the screening recommended for you and due dates:  Health Maintenance  Topic Date Due   Zoster (Shingles) Vaccine (1 of 2) Never done   Screening for Lung Cancer  07/31/2023   COVID-19 Vaccine (4 - 2024-25 season) 04/05/2024*   Flu Shot  12/21/2023   Colon Cancer Screening  03/17/2024   Medicare Annual Wellness Visit  10/08/2024   DTaP/Tdap/Td vaccine (3 - Td or Tdap)  10/10/2032   Pneumonia Vaccine  Completed   Hepatitis C Screening  Completed   HPV Vaccine  Aged Out   Meningitis B Vaccine  Aged Out  *Topic was postponed. The date shown is not the original due date.

## 2023-10-09 NOTE — Progress Notes (Signed)
 Subjective:   Edward Warren is a 68 y.o. male who presents for Medicare Annual/Subsequent preventive examination.  Visit Complete: Virtual I connected with  [name] on 10/09/23 by a audio enabled telemedicine application and verified that I am speaking with the correct person using two identifiers.  Patient Location: Home  Provider Location: Office/Clinic  I discussed the limitations of evaluation and management by telemedicine. The patient expressed understanding and agreed to proceed.  Vital Signs: Because this visit was a virtual/telehealth visit, some criteria may be missing or patient reported. Any vitals not documented were not able to be obtained and vitals that have been documented are patient reported.  Patient Medicare AWV questionnaire was completed by the patient on n/a; I have confirmed that all information answered by patient is correct and no changes since this date.  Cardiac Risk Factors include: advanced age (>66men, >79 women);smoking/ tobacco exposure;obesity (BMI >30kg/m2);male gender;hypertension     Objective:    Today's Vitals   10/09/23 1301  Weight: 206 lb (93.4 kg)  Height: 5\' 11"  (1.803 m)   Body mass index is 28.73 kg/m.     10/09/2023    1:09 PM 10/03/2022    1:04 PM 09/28/2021   10:05 AM 02/09/2016    9:00 AM 06/17/2015   11:05 AM 03/06/2014   10:34 AM  Advanced Directives  Does Patient Have a Medical Advance Directive? Yes Yes Yes Yes Yes No  Type of Estate agent of [place];Living will Living will Living will  Healthcare Power of [place];Living will   Does patient want to make changes to medical advance directive? No - Patient declined No - Patient declined No - Patient declined     Copy of Healthcare Power of Attorney in Chart? No - copy requested   No - copy requested    Would patient like information on creating a medical advance directive?      Yes - Educational materials given    Current Medications  (verified) Outpatient Encounter Medications as of 10/09/2023  Medication Sig   allopurinol  (ZYLOPRIM ) 300 MG tablet Take 1 tablet (300 mg total) by mouth 2 (two) times daily. DX:m10.9   atorvastatin  (LIPITOR) 20 MG tablet Take 1 tablet (20 mg total) by mouth at bedtime.   doxycycline  (VIBRA -TABS) 100 MG tablet Take 1 tablet (100 mg total) by mouth 2 (two) times daily.   Fluticasone -Umeclidin-Vilant (TRELEGY ELLIPTA ) 100-62.5-25 MCG/ACT AEPB TAKE 1 PUFF BY MOUTH EVERY DAY   Olmesartan -amLODIPine -HCTZ 40-5-12.5 MG TABS Take 1 tablet by mouth daily.   No facility-administered encounter medications on file as of 10/09/2023.    Allergies (verified) Prednisone    History: 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HCC)    per patient dx by ct scan 2012   COPD (chronic obstructive pulmonary disease) (HCC)    PFTS: 7-10: TEV1 ration of 62, FEV1 63% and FVC 80% (mod, class II)   Emphysema of lung (HCC)    History of shingles    Hypertension    Personal history of colonic adenomas 07/05/2012   Past Surgical History:  Procedure Laterality Date   COLONOSCOPY  multiple   Family History  Problem Relation Age of Onset   COPD Mother    Heart attack Father    Stroke Maternal Grandfather    Breast cancer Maternal Aunt    Cancer Maternal Uncle        throat   Esophageal cancer Maternal  Uncle    Colon cancer Neg Hx    Colon polyps Neg Hx    Rectal cancer Neg Hx    Stomach cancer Neg Hx    Social History   Socioeconomic History   Marital status: Married    Spouse name: Edward Warren   Number of children: 1   Years of education: 12th grade   Highest education level: 12th grade  Occupational History   Occupation: disabled    Comment: Health and safety inspector   Occupation: part-time  Tobacco Use   Smoking status: Every Day    Current packs/day: 0.50    Average packs/day: 0.5 packs/day for 52.7 years (26.3 ttl pk-yrs)    Types: Cigarettes    Start date: 12/24/1970     Last attempt to quit: 12/22/2012   Smokeless tobacco: Never   Tobacco comments:    Smokes 1/2 pack a day   Vaping Use   Vaping status: Never Used  Substance and Sexual Activity   Alcohol use: Not Currently   Drug use: No   Sexual activity: Not on file  Other Topics Concern   Not on file  Social History Narrative   Lives with his wife. He has one child. He enjoys working in his yard.   Social Drivers of Corporate investment banker Strain: Low Risk  (10/09/2023)   Overall Financial Resource Strain (CARDIA)    Difficulty of Paying Living Expenses: Not hard at all  Food Insecurity: No Food Insecurity (10/09/2023)   Hunger Vital Sign    Worried About Running Out of Food in the Last Year: Never true    Ran Out of Food in the Last Year: Never true  Transportation Needs: No Transportation Needs (10/09/2023)   PRAPARE - Administrator, Civil Service (Medical): No    Lack of Transportation (Non-Medical): No  Physical Activity: Sufficiently Active (10/09/2023)   Exercise Vital Sign    Days of Exercise per Week: 7 days    Minutes of Exercise per Session: 60 min  Stress: No Stress Concern Present (10/09/2023)   [employer] of Occupational Health - Occupational Stress Questionnaire    Feeling of Stress : Not at all  Social Connections: Moderately Integrated (10/09/2023)   Social Connection and Isolation Panel [NHANES]    Frequency of Communication with Friends and Family: More than three times a week    Frequency of Social Gatherings with Friends and Family: More than three times a week    Attends Religious Services: Never    Database administrator or Organizations: Yes    Attends Engineer, structural: More than 4 times per year    Marital Status: Married    Tobacco Counseling Ready to quit: Not Answered Counseling given: Not Answered Tobacco comments: Smokes 1/2 pack a day    Clinical Intake:  Pre-visit preparation completed: Yes        BMI - recorded:  28.73 Nutritional Status: BMI 25 -29 Overweight Nutritional Risks: None Diabetes: No  How often do you need to have someone help you when you read instructions, pamphlets, or other written materials from your doctor or pharmacy?: 1 - Never What is the last grade level you completed in school?: 14  Interpreter Needed?: No      Activities of Daily Living    10/09/2023    1:02 PM  In your present state of health, do you have any difficulty performing the following activities:  Hearing? 0  Vision? 0  Difficulty concentrating or making  decisions? 0  Walking or climbing stairs? 0  Dressing or bathing? 0  Doing errands, shopping? 0  Preparing Food and eating ? N  Using the Toilet? N  In the past six months, have you accidently leaked urine? N  Do you have problems with loss of bowel control? N  Managing your Medications? N  Managing your Finances? N  Housekeeping or managing your Housekeeping? N    Patient Care Team: Cydney Draft, MD as PCP - General Ahmad, Zeeshan N, MD as Referring Physician (Pulmonary Disease)  Indicate any recent Medical Services you may have received from other than [facility] providers in the past year (date may be approximate).     Assessment:   This is a routine wellness examination for Lazer.  Hearing/Vision screen Hearing Screening - Comments:: Unable to check Vision Screening - Comments:: Unable to check   Goals Addressed             This Visit's Progress    Patient Stated       Patient stated he would like increase exercise.       Depression Screen    10/09/2023    1:08 PM 10/03/2023   11:46 AM 03/21/2023    8:52 AM 10/03/2022    1:05 PM 09/19/2022    8:33 AM 03/02/2022   12:09 PM 09/28/2021   10:07 AM  PHQ 2/9 Scores  PHQ - 2 Score 0 0 0 0 0 0 0  PHQ- 9 Score      0     Fall Risk    10/09/2023    1:10 PM 10/03/2023   11:46 AM 03/21/2023    8:52 AM 01/18/2023    9:05 AM 10/03/2022    1:04 PM  Fall Risk   Falls in the  past year? 1 1 0 0 0  Number falls in past yr: 0 0 0 0 0  Injury with Fall? 1 1 0 0 0  Risk for fall due to : History of fall(s) No Fall Risks No Fall Risks No Fall Risks No Fall Risks  Follow up Falls evaluation completed Falls evaluation completed Falls evaluation completed Falls evaluation completed Falls evaluation completed    MEDICARE RISK AT HOME: Medicare Risk at Home Any stairs in or around the home?: Yes If so, are there any without handrails?: Yes Home free of loose throw rugs in walkways, pet beds, electrical cords, etc?: Yes Adequate lighting in your home to reduce risk of falls?: Yes Life alert?: No Use of a cane, walker or w/c?: No Grab bars in the bathroom?: Yes Shower chair or bench in shower?: No Elevated toilet seat or a handicapped toilet?: No  TIMED UP AND GO:  Was the test performed?  No    Cognitive Function:        10/09/2023    1:11 PM 10/03/2022    1:08 PM 09/28/2021   10:10 AM  6CIT Screen  What Year? 0 points 0 points 0 points  What month? 0 points 0 points 0 points  What time? 0 points 0 points 0 points  Count back from 20 0 points 0 points 0 points  Months in reverse 0 points 0 points 0 points  Repeat phrase 0 points 0 points 0 points  Total Score 0 points 0 points 0 points    Immunizations Immunization History  Administered Date(s) Administered   Fluad Quad(high Dose 65+) 04/09/2019, 02/25/2020, 02/24/2021, 03/02/2022   Fluad Trivalent(High Dose 65+) 03/05/2023   Influenza,inj,Quad PF,6+  Mos 02/20/2014, 03/08/2015, 01/12/2016, 03/28/2018   PFIZER(Purple Top)SARS-COV-2 Vaccination 07/15/2019, 08/07/2019, 03/22/2020   PNEUMOCOCCAL CONJUGATE-20 02/24/2021   Pneumococcal Polysaccharide-23 02/20/2014   Tdap 04/19/2011, 10/11/2022    TDAP status: Up to date  Flu Vaccine status: Up to date  Pneumococcal vaccine status: Up to date  Covid-19 vaccine status: Declined, Education has been provided regarding the importance of this vaccine but  patient still declined. Advised may receive this vaccine at local pharmacy or Health Dept.or vaccine clinic. Aware to provide a copy of the vaccination record if obtained from local pharmacy or Health Dept. Verbalized acceptance and understanding.  Qualifies for Shingles Vaccine? Yes   Zostavax completed No   Shingrix Completed?: No.    Education has been provided regarding the importance of this vaccine. Patient has been advised to call insurance company to determine out of pocket expense if they have not yet received this vaccine. Advised may also receive vaccine at local pharmacy or Health Dept. Verbalized acceptance and understanding.  Screening Tests Health Maintenance  Topic Date Due   Zoster Vaccines- Shingrix (1 of 2) Never done   Lung Cancer Screening  07/31/2023   COVID-19 Vaccine (4 - 2024-25 season) 04/05/2024 (Originally 01/21/2023)   INFLUENZA VACCINE  12/21/2023   Colonoscopy  03/17/2024   Medicare Annual Wellness (AWV)  10/08/2024   DTaP/Tdap/Td (3 - Td or Tdap) 10/10/2032   Pneumonia Vaccine 88+ Years old  Completed   Hepatitis C Screening  Completed   HPV VACCINES  Aged Out   Meningococcal B Vaccine  Aged Out    Health Maintenance  Health Maintenance Due  Topic Date Due   Zoster Vaccines- Shingrix (1 of 2) Never done   Lung Cancer Screening  07/31/2023    Colorectal cancer screening: Type of screening: Colonoscopy. Completed 10/27*2020. Repeat every 5 years  Lung Cancer Screening: (Low Dose CT Chest recommended if Age 22-80 years, 20 pack-year currently smoking OR have quit w/in 15years.) does qualify.   Lung Cancer Screening Referral: patient already in [facility] program.   Additional Screening:  Hepatitis C Screening: does qualify; Completed 02/06/2017  Vision Screening: Recommended annual ophthalmology exams for early detection of glaucoma and other disorders of the eye. Is the patient up to date with their annual eye exam?  No  Who is the provider or what is  the name of the office in which the patient attends annual eye exams?  If pt is not established with a provider, would they like to be referred to a provider to establish care? No .   Dental Screening: Recommended annual dental exams for proper oral hygiene   Community Resource Referral / Chronic Care Management: CRR required this visit?  No   CCM required this visit?  No     Plan:     I have personally reviewed and noted the following in the patient's chart:   Medical and social history Use of alcohol, tobacco or illicit drugs  Current medications and supplements including opioid prescriptions. Patient is not currently taking opioid prescriptions. Functional ability and status Nutritional status Physical activity Advanced directives List of other physicians Hospitalizations, surgeries, and ER visits in previous 12 months None Vitals Screenings to include cognitive, depression, and falls Referrals and appointments  In addition, I have reviewed and discussed with patient certain preventive protocols, quality metrics, and best practice recommendations. A written personalized care plan for preventive services as well as general preventive health recommendations were provided to patient.     Aubrey Leaf, CMA  10/09/2023   After Visit Summary: (Mail) Due to this being a telephonic visit, the after visit summary with patients personalized plan was offered to patient via mail   Nurse Notes:   EDWIN BAINES is a 68 y.o. male patient of Metheney, Corita Diego, MD who had a Medicare Annual Wellness Visit today via telephone. Yvan is Retired and lives with their spouse. He has 1 child. he reports that he is socially active and does interact with friends/family regularly. He is moderately physically active and enjoys gardening and yardwork

## 2023-11-30 ENCOUNTER — Other Ambulatory Visit: Payer: Self-pay | Admitting: Family Medicine

## 2023-11-30 DIAGNOSIS — I10 Essential (primary) hypertension: Secondary | ICD-10-CM

## 2023-12-28 ENCOUNTER — Other Ambulatory Visit: Payer: Self-pay | Admitting: Family Medicine

## 2023-12-28 DIAGNOSIS — M1A09X0 Idiopathic chronic gout, multiple sites, without tophus (tophi): Secondary | ICD-10-CM

## 2023-12-28 DIAGNOSIS — M1A0690 Idiopathic chronic gout, unspecified knee, without tophus (tophi): Secondary | ICD-10-CM

## 2023-12-28 DIAGNOSIS — M10071 Idiopathic gout, right ankle and foot: Secondary | ICD-10-CM

## 2024-01-17 DIAGNOSIS — H524 Presbyopia: Secondary | ICD-10-CM | POA: Diagnosis not present

## 2024-01-17 DIAGNOSIS — H25813 Combined forms of age-related cataract, bilateral: Secondary | ICD-10-CM | POA: Diagnosis not present

## 2024-01-17 DIAGNOSIS — H5203 Hypermetropia, bilateral: Secondary | ICD-10-CM | POA: Diagnosis not present

## 2024-01-17 DIAGNOSIS — H52203 Unspecified astigmatism, bilateral: Secondary | ICD-10-CM | POA: Diagnosis not present

## 2024-02-28 DIAGNOSIS — Z23 Encounter for immunization: Secondary | ICD-10-CM | POA: Diagnosis not present

## 2024-05-21 ENCOUNTER — Other Ambulatory Visit: Payer: Self-pay | Admitting: Family Medicine

## 2024-05-21 DIAGNOSIS — I10 Essential (primary) hypertension: Secondary | ICD-10-CM

## 2024-05-21 NOTE — Telephone Encounter (Signed)
 Requesting rx rf of Olmesartan -amLODIPine -HCTZ 40-5-12.5 MG TABS  Last written 11/30/2023 as 90 day with one refill  Last OV 10/03/2023- note reads Hypotension-I think it is most likely related to the fact that he is lost 25 pounds so I think he is most likely overmedicated at this point Sorg and adjust his Tribenzor dose down and have him try that for 30 days and see if home blood pressures look better as well.  Upcoming appt 10/09/2024 AWV

## 2024-10-09 ENCOUNTER — Ambulatory Visit
